# Patient Record
Sex: Female | Born: 1985 | Race: White | Marital: Married | Smoking: Never smoker
Health system: Southern US, Community
[De-identification: ages and names within clinical notes are randomized; demographics above are authoritative.]

## PROBLEM LIST (undated history)

## (undated) DIAGNOSIS — R112 Nausea with vomiting, unspecified: Secondary | ICD-10-CM

## (undated) DIAGNOSIS — T8859XA Other complications of anesthesia, initial encounter: Secondary | ICD-10-CM

## (undated) DIAGNOSIS — N12 Tubulo-interstitial nephritis, not specified as acute or chronic: Secondary | ICD-10-CM

## (undated) DIAGNOSIS — Z1371 Encounter for nonprocreative screening for genetic disease carrier status: Secondary | ICD-10-CM

## (undated) DIAGNOSIS — N390 Urinary tract infection, site not specified: Secondary | ICD-10-CM

## (undated) DIAGNOSIS — Z9889 Other specified postprocedural states: Secondary | ICD-10-CM

## (undated) DIAGNOSIS — U071 COVID-19: Secondary | ICD-10-CM

## (undated) DIAGNOSIS — Z803 Family history of malignant neoplasm of breast: Secondary | ICD-10-CM

## (undated) DIAGNOSIS — B019 Varicella without complication: Secondary | ICD-10-CM

## (undated) DIAGNOSIS — T4145XA Adverse effect of unspecified anesthetic, initial encounter: Secondary | ICD-10-CM

## (undated) DIAGNOSIS — F419 Anxiety disorder, unspecified: Secondary | ICD-10-CM

## (undated) DIAGNOSIS — D169 Benign neoplasm of bone and articular cartilage, unspecified: Secondary | ICD-10-CM

## (undated) DIAGNOSIS — T7840XA Allergy, unspecified, initial encounter: Secondary | ICD-10-CM

## (undated) HISTORY — DX: Allergy, unspecified, initial encounter: T78.40XA

## (undated) HISTORY — DX: Anxiety disorder, unspecified: F41.9

## (undated) HISTORY — DX: Benign neoplasm of bone and articular cartilage, unspecified: D16.9

## (undated) HISTORY — DX: Other specified postprocedural states: Z98.890

## (undated) HISTORY — DX: Family history of malignant neoplasm of breast: Z80.3

## (undated) HISTORY — DX: Tubulo-interstitial nephritis, not specified as acute or chronic: N12

## (undated) HISTORY — DX: Urinary tract infection, site not specified: N39.0

## (undated) HISTORY — DX: Encounter for nonprocreative screening for genetic disease carrier status: Z13.71

## (undated) HISTORY — DX: Varicella without complication: B01.9

## (undated) HISTORY — DX: COVID-19: U07.1

## (undated) HISTORY — PX: BONE CYST EXCISION: SHX376

---
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
## 2012-06-22 ENCOUNTER — Encounter: Payer: Self-pay | Admitting: Obstetrics and Gynecology

## 2012-06-22 ENCOUNTER — Ambulatory Visit (INDEPENDENT_AMBULATORY_CARE_PROVIDER_SITE_OTHER): Payer: BC Managed Care – PPO | Admitting: Obstetrics and Gynecology

## 2012-06-22 VITALS — BP 117/69 | HR 63 | Ht 63.0 in | Wt 109.0 lb

## 2012-06-22 DIAGNOSIS — Z124 Encounter for screening for malignant neoplasm of cervix: Secondary | ICD-10-CM

## 2012-06-22 DIAGNOSIS — Z01419 Encounter for gynecological examination (general) (routine) without abnormal findings: Secondary | ICD-10-CM

## 2012-06-22 DIAGNOSIS — Z113 Encounter for screening for infections with a predominantly sexual mode of transmission: Secondary | ICD-10-CM

## 2012-06-22 MED ORDER — NORGESTIMATE-ETH ESTRADIOL 0.25-35 MG-MCG PO TABS: 1.0000 | ORAL_TABLET | Freq: Every day | ORAL | Status: DC

## 2012-06-22 NOTE — Patient Instructions (Signed)
Preventive Care for Adults, Female A healthy lifestyle and preventive care can promote health and wellness. Preventive health guidelines for women include the following key practices.  A routine yearly physical is a good way to check with your caregiver about your health and preventive screening. It is a chance to share any concerns and updates on your health, and to receive a thorough exam.   Visit your dentist for a routine exam and preventive care every 6 months. Brush your teeth twice a day and floss once a day. Good oral hygiene prevents tooth decay and gum disease.   The frequency of eye exams is based on your age, health, family medical history, use of contact lenses, and other factors. Follow your caregiver's recommendations for frequency of eye exams.   Eat a healthy diet. Foods like vegetables, fruits, whole grains, low-fat dairy products, and lean protein foods contain the nutrients you need without too many calories. Decrease your intake of foods high in solid fats, added sugars, and salt. Eat the right amount of calories for you.Get information about a proper diet from your caregiver, if necessary.   Regular physical exercise is one of the most important things you can do for your health. Most adults should get at least 150 minutes of moderate-intensity exercise (any activity that increases your heart rate and causes you to sweat) each week. In addition, most adults need muscle-strengthening exercises on 2 or more days a week.   Maintain a healthy weight. The body mass index (BMI) is a screening tool to identify possible weight problems. It provides an estimate of body fat based on height and weight. Your caregiver can help determine your BMI, and can help you achieve or maintain a healthy weight.For adults 20 years and older:   A BMI below 18.5 is considered underweight.   A BMI of 18.5 to 24.9 is normal.   A BMI of 25 to 29.9 is considered overweight.   A BMI of 30 and above is  considered obese.   Maintain normal blood lipids and cholesterol levels by exercising and minimizing your intake of saturated fat. Eat a balanced diet with plenty of fruit and vegetables. Blood tests for lipids and cholesterol should begin at age 20 and be repeated every 5 years. If your lipid or cholesterol levels are high, you are over 50, or you are at high risk for heart disease, you may need your cholesterol levels checked more frequently.Ongoing high lipid and cholesterol levels should be treated with medicines if diet and exercise are not effective.   If you smoke, find out from your caregiver how to quit. If you do not use tobacco, do not start.   If you are pregnant, do not drink alcohol. If you are breastfeeding, be very cautious about drinking alcohol. If you are not pregnant and choose to drink alcohol, do not exceed 1 drink per day. One drink is considered to be 12 ounces (355 mL) of beer, 5 ounces (148 mL) of wine, or 1.5 ounces (44 mL) of liquor.   Avoid use of street drugs. Do not share needles with anyone. Ask for help if you need support or instructions about stopping the use of drugs.   High blood pressure causes heart disease and increases the risk of stroke. Your blood pressure should be checked at least every 1 to 2 years. Ongoing high blood pressure should be treated with medicines if weight loss and exercise are not effective.   If you are 55 to 24   years old, ask your caregiver if you should take aspirin to prevent strokes.   Diabetes screening involves taking a blood sample to check your fasting blood sugar level. This should be done once every 3 years, after age 45, if you are within normal weight and without risk factors for diabetes. Testing should be considered at a younger age or be carried out more frequently if you are overweight and have at least 1 risk factor for diabetes.   Breast cancer screening is essential preventive care for women. You should practice "breast  self-awareness." This means understanding the normal appearance and feel of your breasts and may include breast self-examination. Any changes detected, no matter how small, should be reported to a caregiver. Women in their 20s and 30s should have a clinical breast exam (CBE) by a caregiver as part of a regular health exam every 1 to 3 years. After age 40, women should have a CBE every year. Starting at age 40, women should consider having a mammography (breast X-ray test) every year. Women who have a family history of breast cancer should talk to their caregiver about genetic screening. Women at a high risk of breast cancer should talk to their caregivers about having magnetic resonance imaging (MRI) and a mammography every year.   The Pap test is a screening test for cervical cancer. A Pap test can show cell changes on the cervix that might become cervical cancer if left untreated. A Pap test is a procedure in which cells are obtained and examined from the lower end of the uterus (cervix).   Women should have a Pap test starting at age 21.   Between ages 21 and 29, Pap tests should be repeated every 2 years.   Beginning at age 30, you should have a Pap test every 3 years as long as the past 3 Pap tests have been normal.   Some women have medical problems that increase the chance of getting cervical cancer. Talk to your caregiver about these problems. It is especially important to talk to your caregiver if a new problem develops soon after your last Pap test. In these cases, your caregiver may recommend more frequent screening and Pap tests.   The above recommendations are the same for women who have or have not gotten the vaccine for human papillomavirus (HPV).   If you had a hysterectomy for a problem that was not cancer or a condition that could lead to cancer, then you no longer need Pap tests. Even if you no longer need a Pap test, a regular exam is a good idea to make sure no other problems are  starting.   If you are between ages 65 and 70, and you have had normal Pap tests going back 10 years, you no longer need Pap tests. Even if you no longer need a Pap test, a regular exam is a good idea to make sure no other problems are starting.   If you have had past treatment for cervical cancer or a condition that could lead to cancer, you need Pap tests and screening for cancer for at least 20 years after your treatment.   If Pap tests have been discontinued, risk factors (such as a new sexual partner) need to be reassessed to determine if screening should be resumed.   The HPV test is an additional test that may be used for cervical cancer screening. The HPV test looks for the virus that can cause the cell changes on the cervix.   The cells collected during the Pap test can be tested for HPV. The HPV test could be used to screen women aged 30 years and older, and should be used in women of any age who have unclear Pap test results. After the age of 30, women should have HPV testing at the same frequency as a Pap test.   Colorectal cancer can be detected and often prevented. Most routine colorectal cancer screening begins at the age of 50 and continues through age 75. However, your caregiver may recommend screening at an earlier age if you have risk factors for colon cancer. On a yearly basis, your caregiver may provide home test kits to check for hidden blood in the stool. Use of a small camera at the end of a tube, to directly examine the colon (sigmoidoscopy or colonoscopy), can detect the earliest forms of colorectal cancer. Talk to your caregiver about this at age 50, when routine screening begins. Direct examination of the colon should be repeated every 5 to 10 years through age 75, unless early forms of pre-cancerous polyps or small growths are found.   Hepatitis C blood testing is recommended for all people born from 1945 through 1965 and any individual with known risks for hepatitis C.    Practice safe sex. Use condoms and avoid high-risk sexual practices to reduce the spread of sexually transmitted infections (STIs). STIs include gonorrhea, chlamydia, syphilis, trichomonas, herpes, HPV, and human immunodeficiency virus (HIV). Herpes, HIV, and HPV are viral illnesses that have no cure. They can result in disability, cancer, and death. Sexually active women aged 25 and younger should be checked for chlamydia. Older women with new or multiple partners should also be tested for chlamydia. Testing for other STIs is recommended if you are sexually active and at increased risk.   Osteoporosis is a disease in which the bones lose minerals and strength with aging. This can result in serious bone fractures. The risk of osteoporosis can be identified using a bone density scan. Women ages 65 and over and women at risk for fractures or osteoporosis should discuss screening with their caregivers. Ask your caregiver whether you should take a calcium supplement or vitamin D to reduce the rate of osteoporosis.   Menopause can be associated with physical symptoms and risks. Hormone replacement therapy is available to decrease symptoms and risks. You should talk to your caregiver about whether hormone replacement therapy is right for you.   Use sunscreen with sun protection factor (SPF) of 30 or more. Apply sunscreen liberally and repeatedly throughout the day. You should seek shade when your shadow is shorter than you. Protect yourself by wearing long sleeves, pants, a wide-brimmed hat, and sunglasses year round, whenever you are outdoors.   Once a month, do a whole body skin exam, using a mirror to look at the skin on your back. Notify your caregiver of new moles, moles that have irregular borders, moles that are larger than a pencil eraser, or moles that have changed in shape or color.   Stay current with required immunizations.   Influenza. You need a dose every fall (or winter). The composition of  the flu vaccine changes each year, so being vaccinated once is not enough.   Pneumococcal polysaccharide. You need 1 to 2 doses if you smoke cigarettes or if you have certain chronic medical conditions. You need 1 dose at age 65 (or older) if you have never been vaccinated.   Tetanus, diphtheria, pertussis (Tdap, Td). Get 1 dose of   Tdap vaccine if you are younger than age 65, are over 65 and have contact with an infant, are a healthcare worker, are pregnant, or simply want to be protected from whooping cough. After that, you need a Td booster dose every 10 years. Consult your caregiver if you have not had at least 3 tetanus and diphtheria-containing shots sometime in your life or have a deep or dirty wound.   HPV. You need this vaccine if you are a woman age 26 or younger. The vaccine is given in 3 doses over 6 months.   Measles, mumps, rubella (MMR). You need at least 1 dose of MMR if you were born in 1957 or later. You may also need a second dose.   Meningococcal. If you are age 19 to 21 and a first-year college student living in a residence hall, or have one of several medical conditions, you need to get vaccinated against meningococcal disease. You may also need additional booster doses.   Zoster (shingles). If you are age 60 or older, you should get this vaccine.   Varicella (chickenpox). If you have never had chickenpox or you were vaccinated but received only 1 dose, talk to your caregiver to find out if you need this vaccine.   Hepatitis A. You need this vaccine if you have a specific risk factor for hepatitis A virus infection or you simply wish to be protected from this disease. The vaccine is usually given as 2 doses, 6 to 18 months apart.   Hepatitis B. You need this vaccine if you have a specific risk factor for hepatitis B virus infection or you simply wish to be protected from this disease. The vaccine is given in 3 doses, usually over 6 months.  Preventive Services /  Frequency Ages 19 to 39  Blood pressure check.** / Every 1 to 2 years.   Lipid and cholesterol check.** / Every 5 years beginning at age 20.   Clinical breast exam.** / Every 3 years for women in their 20s and 30s.   Pap test.** / Every 2 years from ages 21 through 29. Every 3 years starting at age 30 through age 65 or 70 with a history of 3 consecutive normal Pap tests.   HPV screening.** / Every 3 years from ages 30 through ages 65 to 70 with a history of 3 consecutive normal Pap tests.   Hepatitis C blood test.** / For any individual with known risks for hepatitis C.   Skin self-exam. / Monthly.   Influenza immunization.** / Every year.   Pneumococcal polysaccharide immunization.** / 1 to 2 doses if you smoke cigarettes or if you have certain chronic medical conditions.   Tetanus, diphtheria, pertussis (Tdap, Td) immunization. / A one-time dose of Tdap vaccine. After that, you need a Td booster dose every 10 years.   HPV immunization. / 3 doses over 6 months, if you are 26 and younger.   Measles, mumps, rubella (MMR) immunization. / You need at least 1 dose of MMR if you were born in 1957 or later. You may also need a second dose.   Meningococcal immunization. / 1 dose if you are age 19 to 21 and a first-year college student living in a residence hall, or have one of several medical conditions, you need to get vaccinated against meningococcal disease. You may also need additional booster doses.   Varicella immunization.** / Consult your caregiver.   Hepatitis A immunization.** / Consult your caregiver. 2 doses, 6 to 18 months   apart.   Hepatitis B immunization.** / Consult your caregiver. 3 doses usually over 6 months.    ** Family history and personal history of risk and conditions may change your caregiver's recommendations. Document Released: 11/19/2001 Document Revised: 09/12/2011 Document Reviewed: 02/18/2011 [employer] Patient Information 2012 [employer]. 

## 2012-06-22 NOTE — Progress Notes (Signed)
  Subjective:     Chelsea Duncan is a 26 y.o. female G) with LMP 06/08/2012 and BMI 19 who is here for a comprehensive physical exam. The patient reports no problems. Patient desires to change current OCP as she does not like the way it makes her feel. Patient reports being on different birth control pills in the past and neither have made for feel so moody. Patient is currently using condoms.  History   Social History  . Marital Status: Single    Spouse Name: N/A    Number of Children: N/A  . Years of Education: N/A   Occupational History  . Not on file.   Social History Main Topics  . Smoking status: Never Smoker   . Smokeless tobacco: Not on file  . Alcohol Use: Yes     social  . Drug Use: No  . Sexually Active: Yes -- Female partner(s)    Birth Control/ Protection: Condom   Other Topics Concern  . Not on file   Social History Narrative  . No narrative on file   No health maintenance topics applied.     Review of Systems A comprehensive review of systems was negative.   Objective:     GENERAL: Well-developed, well-nourished female in no acute distress.  HEENT: Normocephalic, atraumatic. Sclerae anicteric.  NECK: Supple. Normal thyroid.  LUNGS: Clear to auscultation bilaterally.  HEART: Regular rate and rhythm. BREASTS: Symmetric in size. No palpable masses or lymphadenopathy, skin changes, or nipple drainage. ABDOMEN: Soft, nontender, nondistended. No organomegaly. PELVIC: Normal external female genitalia. Vagina is pink and rugated.  Normal discharge. Normal appearing cervix. Uterus is normal in size. No adnexal mass or tenderness. EXTREMITIES: No cyanosis, clubbing, or edema, 2+ distal pulses.    Assessment:    Healthy female exam.      Plan:    pap smear collected Patient will be contacted with any abnormal results Rx Sprintec provided See After Visit Summary for Counseling Recommendations

## 2012-11-03 ENCOUNTER — Ambulatory Visit: Payer: BC Managed Care – PPO | Admitting: Obstetrics & Gynecology

## 2012-11-16 ENCOUNTER — Ambulatory Visit: Payer: BC Managed Care – PPO | Admitting: Obstetrics & Gynecology

## 2013-06-01 ENCOUNTER — Other Ambulatory Visit: Payer: Self-pay | Admitting: *Deleted

## 2013-06-01 MED ORDER — NORGESTIMATE-ETH ESTRADIOL 0.25-35 MG-MCG PO TABS: 1.0000 | ORAL_TABLET | Freq: Every day | ORAL | Status: DC

## 2013-06-01 NOTE — Telephone Encounter (Signed)
Patient will need refills of ocp before her appointment is due in September.  Rx called in with 2 refills.

## 2013-07-01 ENCOUNTER — Encounter: Payer: Self-pay | Admitting: Family Medicine

## 2013-07-01 ENCOUNTER — Ambulatory Visit (INDEPENDENT_AMBULATORY_CARE_PROVIDER_SITE_OTHER): Payer: BC Managed Care – PPO | Admitting: Family Medicine

## 2013-07-01 VITALS — BP 112/70 | HR 60 | Ht 64.0 in | Wt 109.0 lb

## 2013-07-01 DIAGNOSIS — Z3041 Encounter for surveillance of contraceptive pills: Secondary | ICD-10-CM

## 2013-07-01 DIAGNOSIS — Z23 Encounter for immunization: Secondary | ICD-10-CM

## 2013-07-01 DIAGNOSIS — Z01419 Encounter for gynecological examination (general) (routine) without abnormal findings: Secondary | ICD-10-CM

## 2013-07-01 DIAGNOSIS — Z803 Family history of malignant neoplasm of breast: Secondary | ICD-10-CM

## 2013-07-01 MED ORDER — NORGESTIMATE-ETH ESTRADIOL 0.25-35 MG-MCG PO TABS: 1.0000 | ORAL_TABLET | Freq: Every day | ORAL | Status: DC

## 2013-07-01 NOTE — Patient Instructions (Addendum)
Preparing for Pregnancy Preparing for pregnancy (preconceptual care) by getting counseling and information from your caregiver before getting pregnant is a good idea. It will help you and your baby have a better chance to have a healthy, safe pregnancy and delivery of your baby. Make an appointment with your caregiver to talk about your health, medical, and family history and how to prepare yourself before getting pregnant. Your caregiver will do a complete physical exam and a Pap test. They will want to know:  About you, your spouse or partner, and your family's medical and genetic history.  If you are eating a balanced diet and drinking enough fluids.  What vitamins and mineral supplements you are taking. This includes taking folic acid before getting pregnant to help prevent birth defects.  What medications you are taking including prescription, over-the-counter and herbal medications.  If there is any substance abuse like alcohol, smoking, and illegal drugs.  If there is any mental or physical domestic violence.  If there is any risk of sexually transmitted disease between you and your partner.  What immunizations and vaccinations you have had and what you may need before getting pregnant.  If you should get tested for HIV infection.  If there is any exposure to chemical or toxic substances at home or work.  If there are medical problems you have that need to be treated and kept under control before getting pregnant such as diabetes, high blood pressure or others.  If there were any past surgeries, pregnancies and problems with them.  What your current weight is and to set a goal as to how much weight you should gain while pregnant. Also, they will check if you should lose or gain weight before getting pregnant.  What is your exercise routine and what it is safe when you are pregnant.  If there are any physical disabilities that need to be addressed.  About spacing your  pregnancies when there are other children.  If there is a financial problem that may affect you having a child. After talking about the above points with your caregiver, your caregiver will give you advice on how to help treat and work with you on solving any issues, if necessary, before getting pregnant. The goal is to have a healthy and safe pregnancy for you and your baby. You should keep an accurate record of your menstrual periods because it will help in determining your due date. Immunizations that you should have before getting pregnant:   Regular measles, [place] measles (rubella) and mumps.  Tetanus and diphtheria.  Chickenpox, if not immune.  Herpes zoster (Varicella) if not immune.  Human papilloma virus vaccine (HPV) between the age of 63 and 78 years old.  Hepatitis A vaccine.  Hepatitis B vaccine.  Influenza vaccine.  Pneumococcal vaccine (pneumonia). You should avoid getting pregnant for one month after getting vaccinated with a live virus vaccine such as [place] measles (rubella) vaccine. Other immunizations may be necessary depending on where you live, such as malaria. Ask your caregiver if any other immunizations are needed for you. HOME CARE INSTRUCTIONS   Follow the advice of your caregiver.  Before getting pregnant:  Begin taking vitamins, supplements, and 0.4 milligrams folic acid daily.  Get your immunizations up-to-date.  Get help from a nutrition counselor if you do not understand what a balanced diet is, need help with a special medical diet or if you need help to lose or gain weight.  Begin exercising.  Stop smoking, taking illegal drugs,  and drinking alcoholic beverages.  Get counseling if there is and type of domestic violence.  Get checked for sexually transmitted diseases including HIV.  Get any medical problems under control (diabetes, high blood pressure, convulsions, asthma or others).  Resolve any financial concerns.  Be sure you and  your spouse or partner are ready to have a baby.  Keep an accurate record of your menstrual periods. Document Released: 09/05/2008 Document Revised: 12/16/2011 Document Reviewed: 09/05/2008 [facility] Patient Information 2014 [place], [place]. Preventive Care for Adults, Female A healthy lifestyle and preventive care can promote health and wellness. Preventive health guidelines for women include the following key practices.  A routine yearly physical is a good way to check with your caregiver about your health and preventive screening. It is a chance to share any concerns and updates on your health, and to receive a thorough exam.  Visit your dentist for a routine exam and preventive care every 6 months. Brush your teeth twice a day and floss once a day. Good oral hygiene prevents tooth decay and gum disease.  The frequency of eye exams is based on your age, health, family medical history, use of contact lenses, and other factors. Follow your caregiver's recommendations for frequency of eye exams.  Eat a healthy diet. Foods like vegetables, fruits, whole grains, low-fat dairy products, and lean protein foods contain the nutrients you need without too many calories. Decrease your intake of foods high in solid fats, added sugars, and salt. Eat the right amount of calories for you.Get information about a proper diet from your caregiver, if necessary.  Regular physical exercise is one of the most important things you can do for your health. Most adults should get at least 150 minutes of moderate-intensity exercise (any activity that increases your heart rate and causes you to sweat) each week. In addition, most adults need muscle-strengthening exercises on 2 or more days a week.  Maintain a healthy weight. The body mass index (BMI) is a screening tool to identify possible weight problems. It provides an estimate of body fat based on height and weight. Your caregiver can help determine your BMI, and can  help you achieve or maintain a healthy weight.For adults 20 years and older:  A BMI below 18.5 is considered underweight.  A BMI of 18.5 to 24.9 is normal.  A BMI of 25 to 29.9 is considered overweight.  A BMI of 30 and above is considered obese.  Maintain normal blood lipids and cholesterol levels by exercising and minimizing your intake of saturated fat. Eat a balanced diet with plenty of fruit and vegetables. Blood tests for lipids and cholesterol should begin at age 39 and be repeated every 5 years. If your lipid or cholesterol levels are high, you are over 50, or you are at high risk for heart disease, you may need your cholesterol levels checked more frequently.Ongoing high lipid and cholesterol levels should be treated with medicines if diet and exercise are not effective.  If you smoke, find out from your caregiver how to quit. If you do not use tobacco, do not start.  If you are pregnant, do not drink alcohol. If you are breastfeeding, be very cautious about drinking alcohol. If you are not pregnant and choose to drink alcohol, do not exceed 1 drink per day. One drink is considered to be 12 ounces (355 mL) of beer, 5 ounces (148 mL) of wine, or 1.5 ounces (44 mL) of liquor.  Avoid use of street drugs. Do not  share needles with anyone. Ask for help if you need support or instructions about stopping the use of drugs.  High blood pressure causes heart disease and increases the risk of stroke. Your blood pressure should be checked at least every 1 to 2 years. Ongoing high blood pressure should be treated with medicines if weight loss and exercise are not effective.  If you are 70 to 25 years old, ask your caregiver if you should take aspirin to prevent strokes.  Diabetes screening involves taking a blood sample to check your fasting blood sugar level. This should be done once every 3 years, after age 73, if you are within normal weight and without risk factors for diabetes. Testing  should be considered at a younger age or be carried out more frequently if you are overweight and have at least 1 risk factor for diabetes.  Breast cancer screening is essential preventive care for women. You should practice "breast self-awareness." This means understanding the normal appearance and feel of your breasts and may include breast self-examination. Any changes detected, no matter how small, should be reported to a caregiver. Women in their 2s and 30s should have a clinical breast exam (CBE) by a caregiver as part of a regular health exam every 1 to 3 years. After age 37, women should have a CBE every year. Starting at age 28, women should consider having a mammography (breast X-ray test) every year. Women who have a family history of breast cancer should talk to their caregiver about genetic screening. Women at a high risk of breast cancer should talk to their caregivers about having magnetic resonance imaging (MRI) and a mammography every year.  The Pap test is a screening test for cervical cancer. A Pap test can show cell changes on the cervix that might become cervical cancer if left untreated. A Pap test is a procedure in which cells are obtained and examined from the lower end of the uterus (cervix).  Women should have a Pap test starting at age 27.  Between ages 42 and 65, Pap tests should be repeated every 2 years.  Beginning at age 45, you should have a Pap test every 3 years as long as the past 3 Pap tests have been normal.  Some women have medical problems that increase the chance of getting cervical cancer. Talk to your caregiver about these problems. It is especially important to talk to your caregiver if a new problem develops soon after your last Pap test. In these cases, your caregiver may recommend more frequent screening and Pap tests.  The above recommendations are the same for women who have or have not gotten the vaccine for human papillomavirus (HPV).  If you had a  hysterectomy for a problem that was not cancer or a condition that could lead to cancer, then you no longer need Pap tests. Even if you no longer need a Pap test, a regular exam is a good idea to make sure no other problems are starting.  If you are between ages 69 and 22, and you have had normal Pap tests going back 10 years, you no longer need Pap tests. Even if you no longer need a Pap test, a regular exam is a good idea to make sure no other problems are starting.  If you have had past treatment for cervical cancer or a condition that could lead to cancer, you need Pap tests and screening for cancer for at least 20 years after your treatment.  If  Pap tests have been discontinued, risk factors (such as a new sexual partner) need to be reassessed to determine if screening should be resumed.  The HPV test is an additional test that may be used for cervical cancer screening. The HPV test looks for the virus that can cause the cell changes on the cervix. The cells collected during the Pap test can be tested for HPV. The HPV test could be used to screen women aged 72 years and older, and should be used in women of any age who have unclear Pap test results. After the age of 23, women should have HPV testing at the same frequency as a Pap test.  Colorectal cancer can be detected and often prevented. Most routine colorectal cancer screening begins at the age of 57 and continues through age 55. However, your caregiver may recommend screening at an earlier age if you have risk factors for colon cancer. On a yearly basis, your caregiver may provide home test kits to check for hidden blood in the stool. Use of a small camera at the end of a tube, to directly examine the colon (sigmoidoscopy or colonoscopy), can detect the earliest forms of colorectal cancer. Talk to your caregiver about this at age 72, when routine screening begins. Direct examination of the colon should be repeated every 5 to 10 years through age  34, unless early forms of pre-cancerous polyps or small growths are found.  Hepatitis C blood testing is recommended for all people born from [birth year] through 1965 and any individual with known risks for hepatitis C.  Practice safe sex. Use condoms and avoid high-risk sexual practices to reduce the spread of sexually transmitted infections (STIs). STIs include gonorrhea, chlamydia, syphilis, trichomonas, herpes, HPV, and human immunodeficiency virus (HIV). Herpes, HIV, and HPV are viral illnesses that have no cure. They can result in disability, cancer, and death. Sexually active women aged 41 and younger should be checked for chlamydia. Older women with new or multiple partners should also be tested for chlamydia. Testing for other STIs is recommended if you are sexually active and at increased risk.  Osteoporosis is a disease in which the bones lose minerals and strength with aging. This can result in serious bone fractures. The risk of osteoporosis can be identified using a bone density scan. Women ages 110 and over and women at risk for fractures or osteoporosis should discuss screening with their caregivers. Ask your caregiver whether you should take a calcium supplement or vitamin D to reduce the rate of osteoporosis.  Menopause can be associated with physical symptoms and risks. Hormone replacement therapy is available to decrease symptoms and risks. You should talk to your caregiver about whether hormone replacement therapy is right for you.  Use sunscreen with sun protection factor (SPF) of 30 or more. Apply sunscreen liberally and repeatedly throughout the day. You should seek shade when your shadow is shorter than you. Protect yourself by wearing long sleeves, pants, a wide-brimmed hat, and sunglasses year round, whenever you are outdoors.  Once a month, do a whole body skin exam, using a mirror to look at the skin on your back. Notify your caregiver of new moles, moles that have irregular borders,  moles that are larger than a pencil eraser, or moles that have changed in shape or color.  Stay current with required immunizations.  Influenza. You need a dose every fall (or winter). The composition of the flu vaccine changes each year, so being vaccinated once is not enough.  Pneumococcal polysaccharide. You need 1 to 2 doses if you smoke cigarettes or if you have certain chronic medical conditions. You need 1 dose at age 16 (or older) if you have never been vaccinated.  Tetanus, diphtheria, pertussis (Tdap, Td). Get 1 dose of Tdap vaccine if you are younger than age 56, are over 13 and have contact with an infant, are a Research scientist (physical sciences), are pregnant, or simply want to be protected from whooping cough. After that, you need a Td booster dose every 10 years. Consult your caregiver if you have not had at least 3 tetanus and diphtheria-containing shots sometime in your life or have a deep or dirty wound.  HPV. You need this vaccine if you are a woman age 73 or younger. The vaccine is given in 3 doses over 6 months.  Measles, mumps, rubella (MMR). You need at least 1 dose of MMR if you were born in 1957 or later. You may also need a second dose.  Meningococcal. If you are age 68 to 50 and a first-year college student living in a residence hall, or have one of several medical conditions, you need to get vaccinated against meningococcal disease. You may also need additional booster doses.  Zoster (shingles). If you are age 25 or older, you should get this vaccine.  Varicella (chickenpox). If you have never had chickenpox or you were vaccinated but received only 1 dose, talk to your caregiver to find out if you need this vaccine.  Hepatitis A. You need this vaccine if you have a specific risk factor for hepatitis A virus infection or you simply wish to be protected from this disease. The vaccine is usually given as 2 doses, 6 to 18 months apart.  Hepatitis B. You need this vaccine if you have a  specific risk factor for hepatitis B virus infection or you simply wish to be protected from this disease. The vaccine is given in 3 doses, usually over 6 months. Preventive Services / Frequency Ages 61 to 19  Blood pressure check.** / Every 1 to 2 years.  Lipid and cholesterol check.** / Every 5 years beginning at age 60.  Clinical breast exam.** / Every 3 years for women in their 62s and 30s.  Pap test.** / Every 2 years from ages 35 through 89. Every 3 years starting at age 75 through age 40 or 12 with a history of 3 consecutive normal Pap tests.  HPV screening.** / Every 3 years from ages 86 through ages 30 to 70 with a history of 3 consecutive normal Pap tests.  Hepatitis C blood test.** / For any individual with known risks for hepatitis C.  Skin self-exam. / Monthly.  Influenza immunization.** / Every year.  Pneumococcal polysaccharide immunization.** / 1 to 2 doses if you smoke cigarettes or if you have certain chronic medical conditions.  Tetanus, diphtheria, pertussis (Tdap, Td) immunization. / A one-time dose of Tdap vaccine. After that, you need a Td booster dose every 10 years.  HPV immunization. / 3 doses over 6 months, if you are 81 and younger.  Measles, mumps, rubella (MMR) immunization. / You need at least 1 dose of MMR if you were born in 1957 or later. You may also need a second dose.  Meningococcal immunization. / 1 dose if you are age 83 to 6 and a first-year college student living in a residence hall, or have one of several medical conditions, you need to get vaccinated against meningococcal disease. You may also need additional  booster doses.  Varicella immunization.** / Consult your caregiver.  Hepatitis A immunization.** / Consult your caregiver. 2 doses, 6 to 18 months apart.  Hepatitis B immunization.** / Consult your caregiver. 3 doses usually over 6 months. Ages 33 to 59  Blood pressure check.** / Every 1 to 2 years.  Lipid and cholesterol  check.** / Every 5 years beginning at age 40.  Clinical breast exam.** / Every year after age 75.  Mammogram.** / Every year beginning at age 87 and continuing for as long as you are in good health. Consult with your caregiver.  Pap test.** / Every 3 years starting at age 32 through age 53 or 16 with a history of 3 consecutive normal Pap tests.  HPV screening.** / Every 3 years from ages 68 through ages 36 to 42 with a history of 3 consecutive normal Pap tests.  Fecal occult blood test (FOBT) of stool. / Every year beginning at age 66 and continuing until age 36. You may not need to do this test if you get a colonoscopy every 10 years.  Flexible sigmoidoscopy or colonoscopy.** / Every 5 years for a flexible sigmoidoscopy or every 10 years for a colonoscopy beginning at age 63 and continuing until age 82.  Hepatitis C blood test.** / For all people born from [birth year] through 1963 and any individual with known risks for hepatitis C.  Skin self-exam. / Monthly.  Influenza immunization.** / Every year.  Pneumococcal polysaccharide immunization.** / 1 to 2 doses if you smoke cigarettes or if you have certain chronic medical conditions.  Tetanus, diphtheria, pertussis (Tdap, Td) immunization.** / A one-time dose of Tdap vaccine. After that, you need a Td booster dose every 10 years.  Measles, mumps, rubella (MMR) immunization. / You need at least 1 dose of MMR if you were born in 1957 or later. You may also need a second dose.  Varicella immunization.** / Consult your caregiver.  Meningococcal immunization.** / Consult your caregiver.  Hepatitis A immunization.** / Consult your caregiver. 2 doses, 6 to 18 months apart.  Hepatitis B immunization.** / Consult your caregiver. 3 doses, usually over 6 months. Ages 90 and over  Blood pressure check.** / Every 1 to 2 years.  Lipid and cholesterol check.** / Every 5 years beginning at age 78.  Clinical breast exam.** / Every year after age  23.  Mammogram.** / Every year beginning at age 86 and continuing for as long as you are in good health. Consult with your caregiver.  Pap test.** / Every 3 years starting at age 31 through age 36 or 74 with a 3 consecutive normal Pap tests. Testing can be stopped between 65 and 70 with 3 consecutive normal Pap tests and no abnormal Pap or HPV tests in the past 10 years.  HPV screening.** / Every 3 years from ages 44 through ages 50 or 66 with a history of 3 consecutive normal Pap tests. Testing can be stopped between 65 and 70 with 3 consecutive normal Pap tests and no abnormal Pap or HPV tests in the past 10 years.  Fecal occult blood test (FOBT) of stool. / Every year beginning at age 33 and continuing until age 44. You may not need to do this test if you get a colonoscopy every 10 years.  Flexible sigmoidoscopy or colonoscopy.** / Every 5 years for a flexible sigmoidoscopy or every 10 years for a colonoscopy beginning at age 77 and continuing until age 12.  Hepatitis C blood test.** / For  all people born from [birth year] through 1963 and any individual with known risks for hepatitis C.  Osteoporosis screening.** / A one-time screening for women ages 78 and over and women at risk for fractures or osteoporosis.  Skin self-exam. / Monthly.  Influenza immunization.** / Every year.  Pneumococcal polysaccharide immunization.** / 1 dose at age 49 (or older) if you have never been vaccinated.  Tetanus, diphtheria, pertussis (Tdap, Td) immunization. / A one-time dose of Tdap vaccine if you are over 65 and have contact with an infant, are a Research scientist (physical sciences), or simply want to be protected from whooping cough. After that, you need a Td booster dose every 10 years.  Varicella immunization.** / Consult your caregiver.  Meningococcal immunization.** / Consult your caregiver.  Hepatitis A immunization.** / Consult your caregiver. 2 doses, 6 to 18 months apart.  Hepatitis B immunization.** / Check with  your caregiver. 3 doses, usually over 6 months. ** Family history and personal history of risk and conditions may change your caregiver's recommendations. Document Released: 11/19/2001 Document Revised: 12/16/2011 Document Reviewed: 02/18/2011 [facility] Patient Information 2014 [place], [place].

## 2013-07-01 NOTE — Progress Notes (Signed)
  Subjective:     Chelsea Duncan is a 27 y.o. female and is here for a comprehensive physical exam. The patient reports no problems. On OC's, regular cycles.  Getting married next week and unsure of when she would like to achieve pregnancy.    History   Social History  . Marital Status: Single    Spouse Name: N/A    Number of Children: N/A  . Years of Education: N/A   Occupational History  . Not on file.   Social History Main Topics  . Smoking status: Never Smoker   . Smokeless tobacco: Not on file  . Alcohol Use: Yes     Comment: social  . Drug Use: No  . Sexual Activity: Yes    Partners: Male    Birth Control/ Protection: Condom, Pill   Other Topics Concern  . Not on file   Social History Narrative  . No narrative on file   Health Maintenance  Topic Date Due  . Tetanus/tdap  12/15/2006  . Influenza Vaccine  05/07/2013  . Pap Smear  06/23/2015    The following portions of the patient's history were reviewed and updated as appropriate: allergies, current medications, past family history, past medical history, past social history, past surgical history and problem list.  Review of Systems A comprehensive review of systems was negative.   Objective:    BP 112/70  Pulse 60  Ht 5\' 4"  (1.626 m)  Wt 109 lb (49.442 kg)  BMI 18.7 kg/m2  LMP 06/10/2013 General appearance: alert, cooperative and appears stated age Head: Normocephalic, without obvious abnormality, atraumatic Neck: no adenopathy, supple, symmetrical, trachea midline and thyroid not enlarged, symmetric, no tenderness/mass/nodules Lungs: clear to auscultation bilaterally Breasts: normal appearance, no masses or tenderness Heart: regular rate and rhythm, S1, S2 normal, no murmur, click, rub or gallop Abdomen: soft, non-tender; bowel sounds normal; no masses,  no organomegaly Pelvic: cervix normal in appearance, external genitalia normal, no adnexal masses or tenderness, no cervical motion tenderness, uterus  normal size, shape, and consistency and vagina normal without discharge Extremities: extremities normal, atraumatic, no cyanosis or edema Pulses: 2+ and symmetric Skin: Skin color, texture, turgor normal. No rashes or lesions Lymph nodes: Cervical, supraclavicular, and axillary nodes normal. Neurologic: Grossly normal    Assessment:    Healthy female exam.       Plan:  Preconception counseling. Continue OC's for now  Does not need pap--WNL last year. Needs mammograms starting next year.   See After Visit Summary for Counseling Recommendations

## 2013-07-26 ENCOUNTER — Ambulatory Visit: Payer: Self-pay | Admitting: Gastroenterology

## 2013-10-05 ENCOUNTER — Encounter: Payer: Self-pay | Admitting: Family Medicine

## 2013-10-05 ENCOUNTER — Ambulatory Visit (INDEPENDENT_AMBULATORY_CARE_PROVIDER_SITE_OTHER): Payer: BC Managed Care – PPO | Admitting: Family Medicine

## 2013-10-05 VITALS — BP 129/81 | HR 79 | Ht 64.0 in | Wt 110.0 lb

## 2013-10-05 DIAGNOSIS — Z331 Pregnant state, incidental: Secondary | ICD-10-CM | POA: Insufficient documentation

## 2013-10-05 DIAGNOSIS — N912 Amenorrhea, unspecified: Secondary | ICD-10-CM

## 2013-10-05 DIAGNOSIS — N6310 Unspecified lump in the right breast, unspecified quadrant: Secondary | ICD-10-CM

## 2013-10-05 DIAGNOSIS — N63 Unspecified lump in unspecified breast: Secondary | ICD-10-CM

## 2013-10-05 LAB — POCT URINE PREGNANCY: Preg Test, Ur: POSITIVE

## 2013-10-05 NOTE — Assessment & Plan Note (Signed)
Likely growing breast tissue, related to pregnancy and underlying fibrocystic change.  However, given strong family history, will check breast sono.

## 2013-10-05 NOTE — Progress Notes (Signed)
   Subjective:    Patient ID: Chelsea Duncan, female    DOB: 07-29-1986, 27 y.o.   MRN: [id number]  HPI  Has h/o fibrocystic change.  Has noted increasing size in findings along right breast.  She is recently married and is pregnant right now.  Has strong fh of breast CA and is wanting an evaluation.  Review of Systems  Constitutional: Negative for fever.  Respiratory: Negative for shortness of breath.   Gastrointestinal: Negative for nausea and abdominal pain.       Objective:   Physical Exam  Vitals reviewed. Constitutional: She appears well-developed and well-nourished.  Cardiovascular: Normal rate.   Pulmonary/Chest: Effort normal.    Abdominal: Soft.          Assessment & Plan:

## 2013-10-05 NOTE — Patient Instructions (Signed)
Fibrocystic Breast Changes Fibrocystic breast changes occur when breast ducts become blocked, causing painful, fluid-filled lumps (cysts) to form in the breast. This is a common condition that is noncancerous (benign). It occurs when women go through hormonal changes during their menstrual cycle. Fibrocystic breast changes can affect one or both breasts. CAUSES  The exact cause of fibrocystic breast changes is not known, but it may be related to the female hormones, estrogen and progesterone. Family traits that get passed from parent to child (genetics) may also be a factor in some cases. SIGNS AND SYMPTOMS   Tenderness, mild discomfort, or pain.   Swelling.   Ropelike feeling when touching the breast.   Lumpy breast, one or both sides.   Changes in breast size, especially before (larger) and after (smaller) the menstrual period.   Green or dark brown nipple discharge (not blood).  Symptoms are usually worse before menstrual periods start and get better toward the end of the menstrual period.  DIAGNOSIS  To make a diagnosis, your health care provider will ask you questions and perform a physical exam of your breasts. The health care provider may recommend other tests that can examine inside your breasts, such as:  A breast X-ray (mammogram).   Ultrasonography.  An MRI.  If something more than fibrocystic breast changes is suspected, your health care provider may take a breast tissue sample (breast biopsy) to examine. TREATMENT  Often, treatment is not needed. Your health care provider may recommend over-the-counter pain relievers to help lessen pain or discomfort caused by the fibrocystic breast changes. You may also be asked to change your diet to limit or stop eating foods or drinking beverages that contain caffeine. Foods and beverages that contain caffeine include chocolate, soda, coffee, and tea. Reducing sugar and fat in your diet may also help. Your health care provider  may also recommend:  Fine needle aspiration to remove fluid from a cyst that is causing pain.   Surgery to remove a large, persistent, and tender cyst. HOME CARE INSTRUCTIONS   Examine your breasts after every menstrual period. If you do not have menstrual periods, check your breasts the first day of every month. Feel for changes, such as more tenderness, a new growth, a change in breast size, or a change in a lump that has always been there.   Only take over-the-counter or prescription medicine as directed by your health care provider.   Wear a well-fitted support or sports bra, especially when exercising.   Decrease or avoid caffeine, fat, and sugar in your diet as directed by your health care provider.  SEEK MEDICAL CARE IF:   You have fluid leaking (discharge) from your nipples, especially bloody discharge.   You have new lumps or bumps in the breast.   Your breast or breasts become enlarged, red, and painful.   You have areas of your breast that pucker in.   Your nipples appear flat or indented.  Document Released: 07/10/2006 Document Revised: 05/26/2013 Document Reviewed: 03/14/2013 [employer] Patient Information 2014 [employer].  

## 2014-03-07 ENCOUNTER — Observation Stay: Payer: Self-pay

## 2014-06-01 ENCOUNTER — Observation Stay: Payer: Self-pay

## 2014-06-02 ENCOUNTER — Inpatient Hospital Stay: Payer: Self-pay

## 2014-06-02 LAB — CBC WITH DIFFERENTIAL/PLATELET
Basophil #: 0 10*3/uL (ref 0.0–0.1)
Basophil %: 0.4 %
EOS ABS: 0.1 10*3/uL (ref 0.0–0.7)
EOS PCT: 1.2 %
HCT: 37.9 % (ref 35.0–47.0)
HGB: 13.4 g/dL (ref 12.0–16.0)
LYMPHS PCT: 19.1 %
Lymphocyte #: 1.7 10*3/uL (ref 1.0–3.6)
MCH: 34.5 pg — AB (ref 26.0–34.0)
MCHC: 35.4 g/dL (ref 32.0–36.0)
MCV: 98 fL (ref 80–100)
Monocyte #: 0.7 x10 3/mm (ref 0.2–0.9)
Monocyte %: 7.5 %
NEUTROS PCT: 71.8 %
Neutrophil #: 6.6 10*3/uL — ABNORMAL HIGH (ref 1.4–6.5)
Platelet: 177 10*3/uL (ref 150–440)
RBC: 3.89 10*6/uL (ref 3.80–5.20)
RDW: 13.6 % (ref 11.5–14.5)
WBC: 9.1 10*3/uL (ref 3.6–11.0)

## 2014-06-04 LAB — HEMATOCRIT: HCT: 35.7 % (ref 35.0–47.0)

## 2014-06-21 IMAGING — US ABDOMEN ULTRASOUND
1 series · 14 of 25 positions shown · non-contrast
Comparison: none

REASON FOR EXAM: complete abd pain generalized  diarrhea  change in bowel
habits
COMMENTS:

[Series 1: abdomen ultrasound · 0.24mm/px · 14 of 76 slices shown]
[im 1/76]
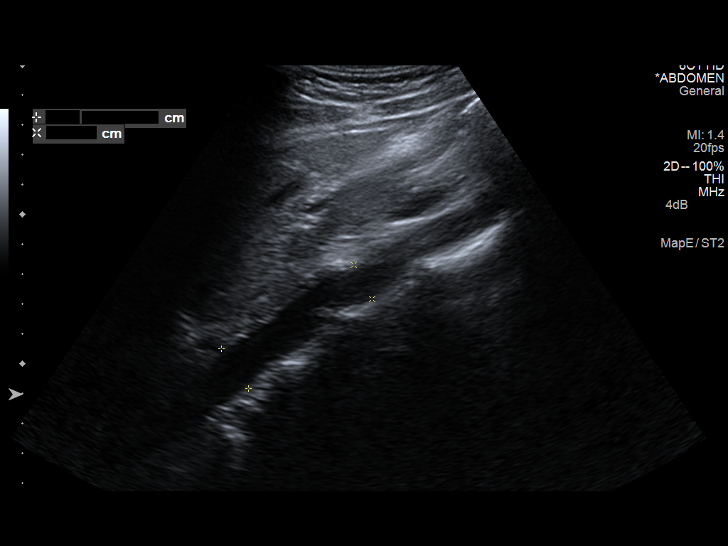
[im 7/76]
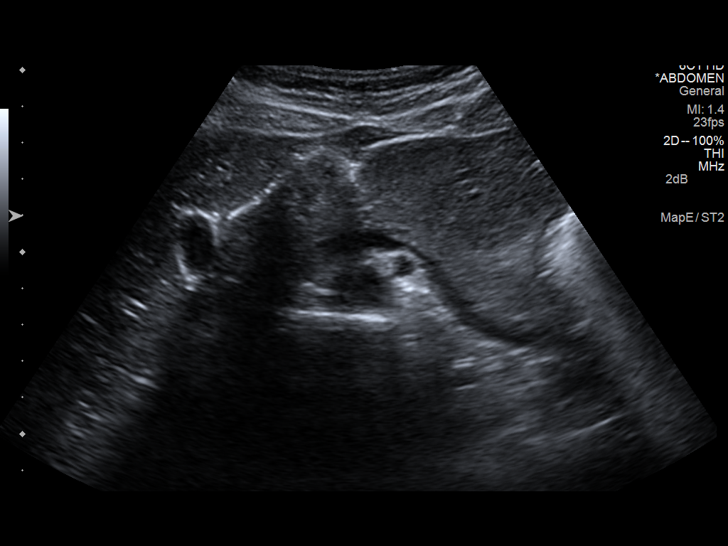
[im 13/76]
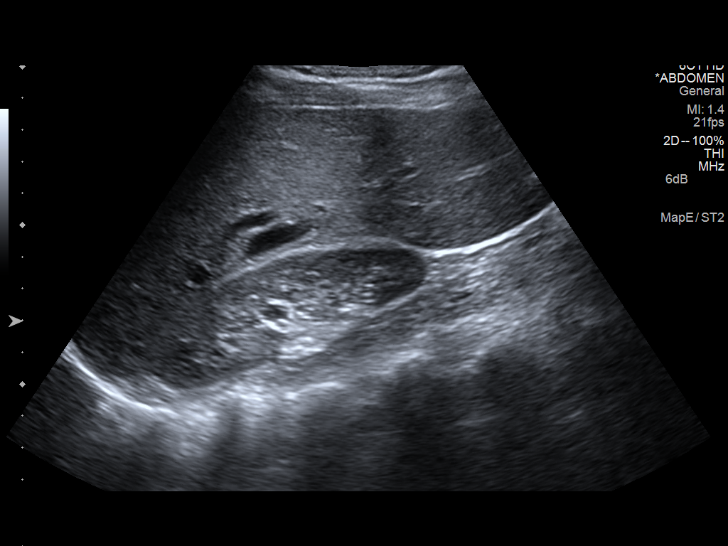
[im 19/76]
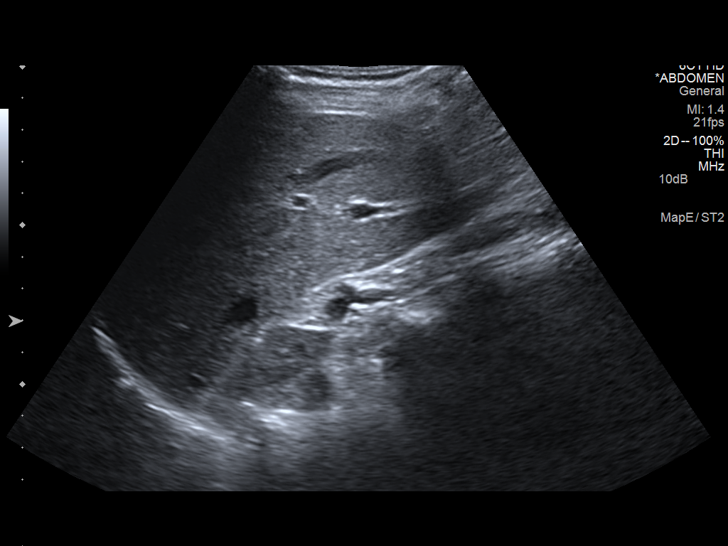
[im 26/76]
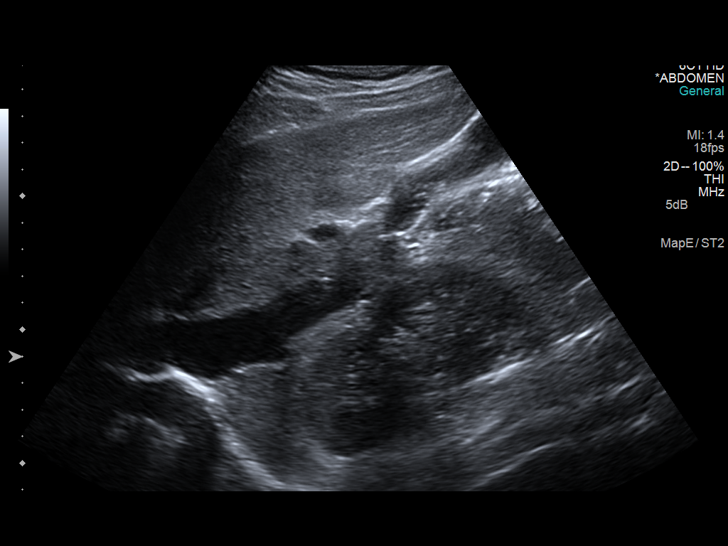
[im 29/76]
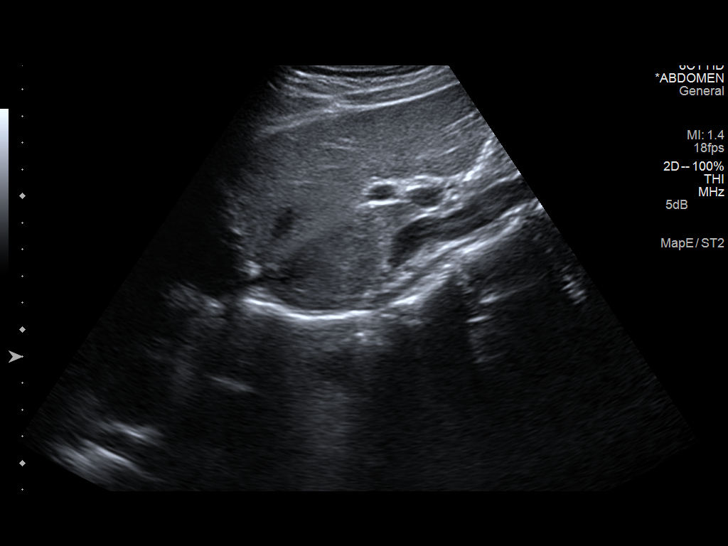
[im 35/76]
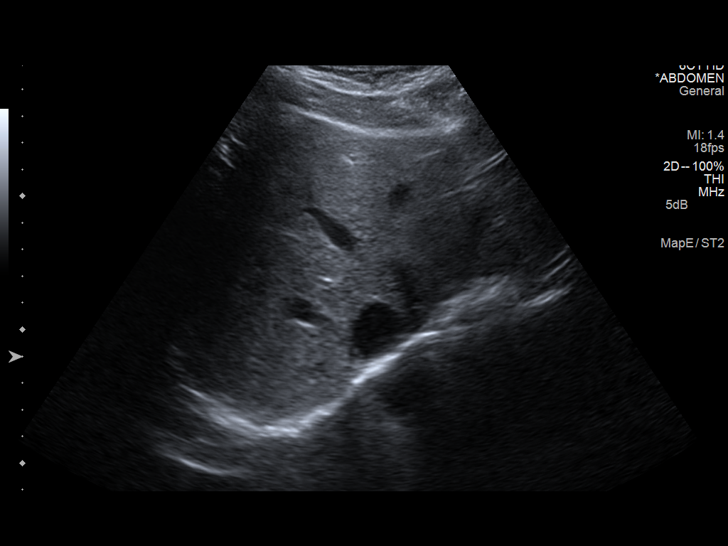
[im 41/76]
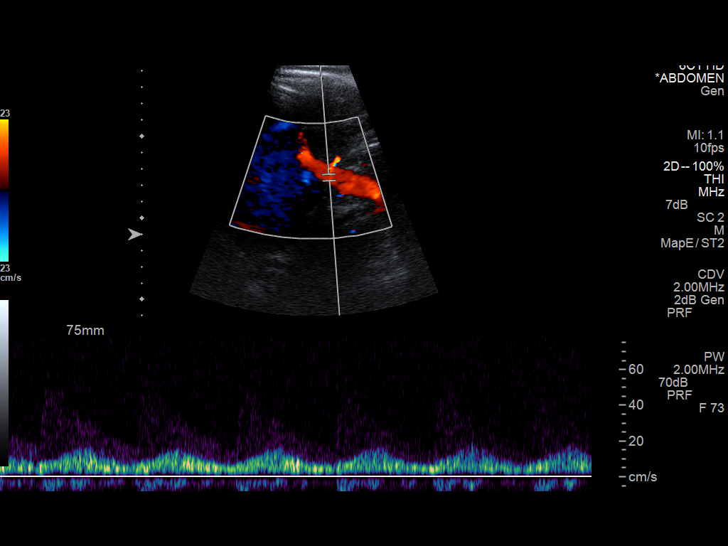
[im 47/76]
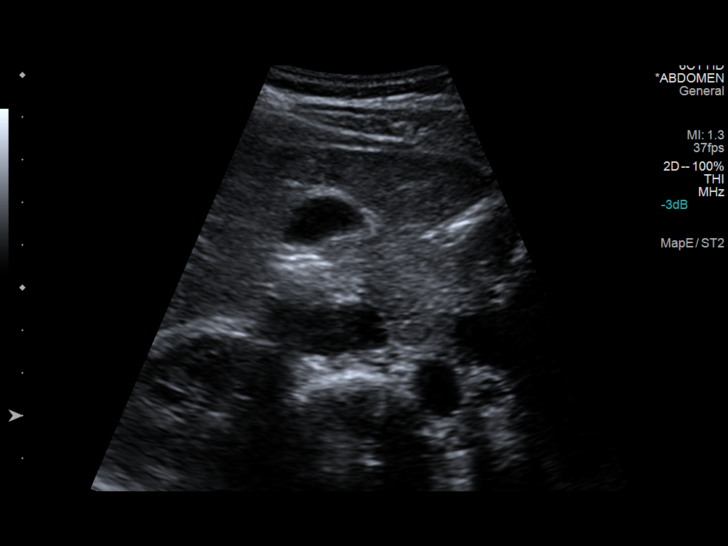
[im 51/76]
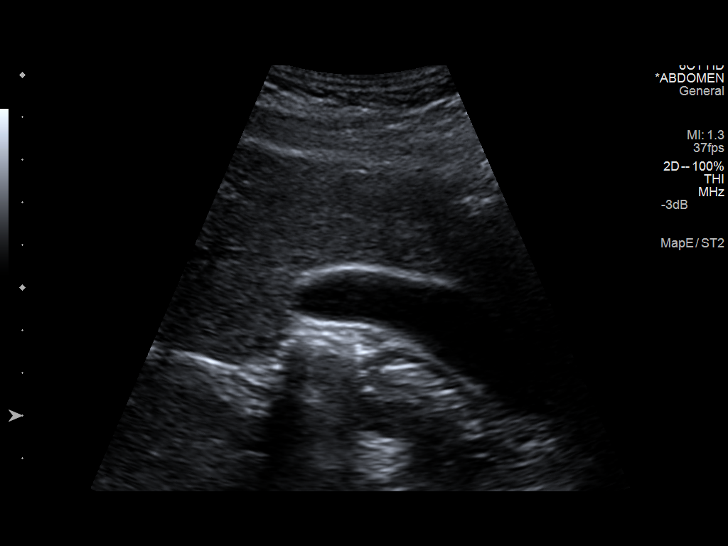
[im 57/76]
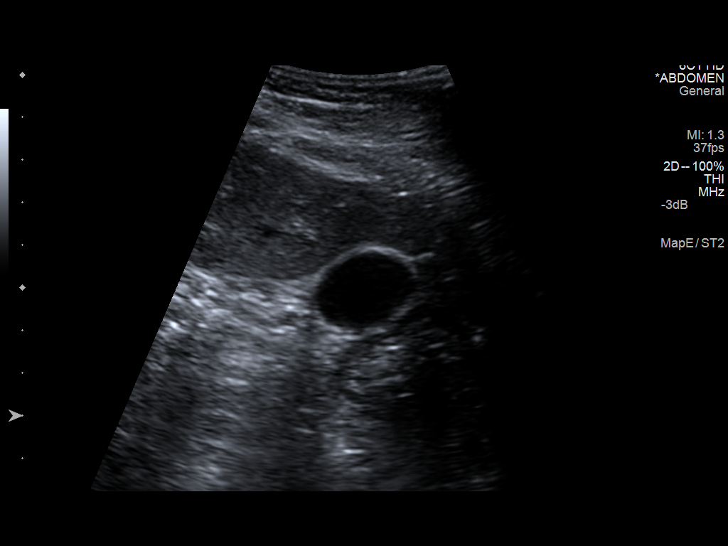
[im 63/76]
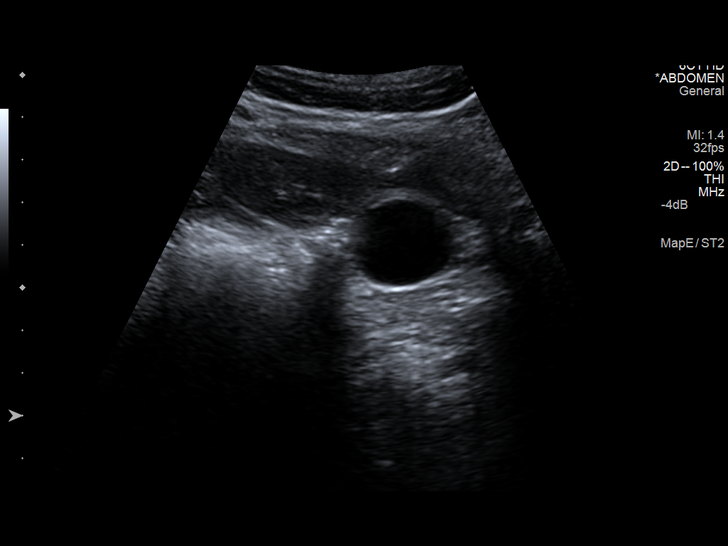
[im 69/76]
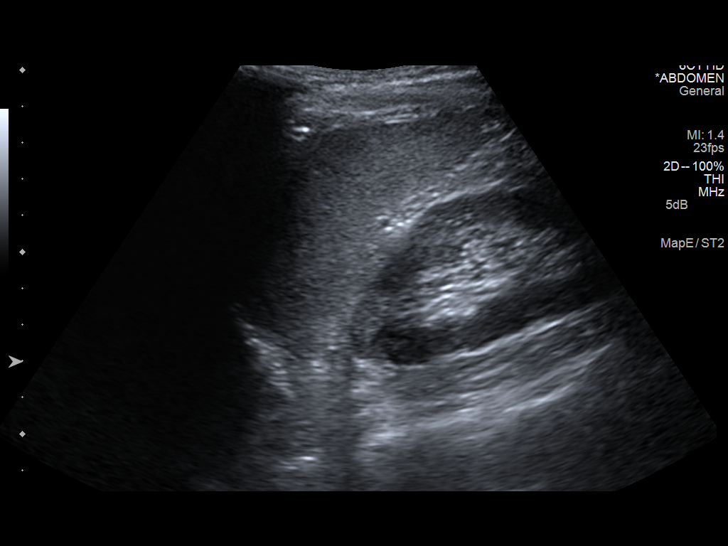
[im 76/76]
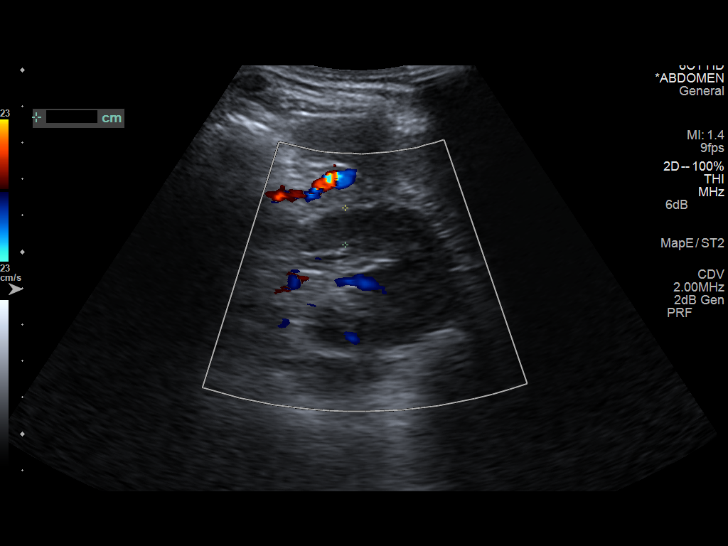

[14 of 25 positions shown; findings below may reference images not displayed]

PROCEDURE:     US  - US ABDOMEN GENERAL SURVEY  - July 26, 2013  [DATE]

RESULT:     The liver exhibits normal echotexture with no focal mass nor
ductal dilation. Portal venous flow is normal in direction toward the liver.
The gallbladder is adequately distended with no evidence of stones, wall
thickening, or pericholecystic fluid. There is no positive sonographic
Murphy's sign. The common bile duct is normal at 1.9 cm in diameter.

The pancreas, spleen, kidneys, abdominal aorta, and inferior vena cava are
normal in appearance. There is no evidence of ascites.
IMPRESSION: Normal abdominal ultrasound examination.

[REDACTED]

## 2015-02-14 NOTE — H&P (Signed)
L&D Evaluation:  History:  HPI -CC: q5-56m UCs -HPI: 29 y/o G1 @ 39/5 (8wk u/s), with above CC. Preg only c/b GBS pos Patient was in the office on 8/25 and was 2-3/70/-1 and membranes stripped. She came in last night for labor eval and was unchanged over two hours and discharged to home. SVE, by RN 4/50/-2 No decreased FM, VB, LOF.   Patient's Medical History No Chronic Illness   Patient's Surgical History none   Medications Pre Natal Vitamins   Allergies Sulfa   Social History none   Family History Non-Contributory   ROS:  ROS as per HPI   Exam:  Vital Signs AF VS normal and stable   General no apparent distress, not painfully contracting   Mental Status clear   Chest CTAB   Heart RRR no MRGs   Abdomen gravid, NTTP, cephalic, [id number] on leopolds   Estimated Fetal Weight Average for gestational age   Fetal Position ceph   Pelvic 4/50/-2 per RN @ 1600   Mebranes Intact   FHT 135 baseline, +accels, no decels, mod var   Ucx q4-67m   Impression:  Impression hopefully transitioning into active labor and GBS pos @ 4cm. patient currently doing well   Plan:  Comments *IUP: rNST *Labor: not in active phase labor yet but since 4cm and GBS pos, pt told that not in active labor yet; given this, g1 status and lives nearby, she was told that if she wanted to stay for augmentation that's fine or if she wanted to go home that's okay, too; she opts for the former. will start amp for ppx and not augment/induce until 2nd dose is in; hopefully by then, pt will be in active labor. okay to come off monitor and do intermittent monitoring. follow up admit labs *GBS pos: will start amp *Analgesia: no needs currently  A pos/RI/VI/rpr pending/hiv neg/hepB neg/tdap utd/pap negative   Electronic Signatures: Aletha Halim (MD)  (Signed 27-Aug-15 16:26)  Authored: L&D Evaluation   Last Updated: 27-Aug-15 16:26 by Aletha Halim (MD)

## 2015-02-14 NOTE — H&P (Signed)
L&D Evaluation:  History:  HPI 29 year old G 1 P0 with EDC=06/04/2014 by a 8wk3d ultrasound presents to L&D with c/o regular contractions since 2PM. She rates the pain of ctxs at 3/10. Denies VB or LOF. Was 2-3 cm in office yesterday and membranes were stripped. Baby active. Prenatal care at [facility] uncomplicated.  First trimester test was negative and  there was a normal anatomy scan LAbs: A POS/RI/VI/ GBS positive   Presents with contractions   Patient's Medical History No Chronic Illness   Patient's Surgical History none   Medications Pre Natal Vitamins   Allergies Sulfa   Social History none   Family History Non-Contributory   ROS:  ROS All systems were reviewed.  HEENT, CNS, GI, GU, Respiratory, CV, Renal and Musculoskeletal systems were found to be normal.   Exam:  Vital Signs stable   General no apparent distress   Abdomen gravid, non-tender   Estimated Fetal Weight Average for gestational age   Fetal Position cephalic   Pelvic no external lesions, 2-3/ballotable-after 2 hours of observation   Mebranes Intact   FHT normal rate with no decels, 125-130 with accelsto 170s   FHT Description mod variability   Ucx q4-7 min apart with coupling, palpate mild-mod   Skin dry   Impression:  Impression IUP at 39 4/7 weeks in prodromal vs false labor. CAT 1 tracing   Plan:  Plan DC home with labor precautions. FU in office as scheduled next week if NIL   Electronic Signatures: Karene Fry (CNM)  (Signed 26-Aug-15 22:36)  Authored: L&D Evaluation   Last Updated: 26-Aug-15 22:36 by Karene Fry (CNM)

## 2015-02-14 NOTE — H&P (Signed)
L&D Evaluation:  History:  HPI pt is a 29yo G1P0 at 27.[redacted] weeks GA with an EDC of 06/04/14 who presents to L&D with reports of pain on the right side for the last several days. 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 She has been doing a lot of lifting, moving, and reaching. She denies urinary symptoms and had a bowel movement today. She denies HA, blurred vision, RUQ pain. She reports the pain as sharp and stabbing. Her prenatal labs are A+/RI/VI.   Presents with other, left abdominal pain   Patient's Medical History No Chronic Illness   Allergies Sulfa   Social History none   Family History Non-Contributory   ROS:  ROS All systems were reviewed.  HEENT, CNS, GI, GU, Respiratory, CV, Renal and Musculoskeletal systems were found to be normal.   Exam:  Vital Signs stable   General no apparent distress   Mental Status clear   Chest clear   Heart normal sinus rhythm   Abdomen gravid, non-tender   Fetal Position transverse on bedside u/s   Pelvic no external lesions   Mebranes Intact   FHT normal rate with no decels, 140-150's gestationally appropriate   Ucx absent   Skin dry, no lesions, no rashes   Lymph no lymphadenopathy   Impression:  Impression IUP at 27.[redacted] weeks GA, probable round ligament pain   Plan:  Plan comfort measures discussed, maternity belt for activity to help with RL pain, follow up in office as scheduled.   Follow Up Appointment already scheduled   Electronic Signatures: Louisa Second (CNM)  (Signed 01-Jun-15 20:19)  Authored: L&D Evaluation   Last Updated: 01-Jun-15 20:19 by Louisa Second (CNM)

## 2015-06-07 LAB — OB RESULTS CONSOLE VARICELLA ZOSTER ANTIBODY, IGG: Varicella: IMMUNE

## 2015-06-07 LAB — OB RESULTS CONSOLE GC/CHLAMYDIA
CHLAMYDIA, DNA PROBE: NEGATIVE
GC PROBE AMP, GENITAL: NEGATIVE

## 2015-06-07 LAB — OB RESULTS CONSOLE HEPATITIS B SURFACE ANTIGEN: Hepatitis B Surface Ag: NEGATIVE

## 2015-06-07 LAB — OB RESULTS CONSOLE RUBELLA ANTIBODY, IGM: RUBELLA: IMMUNE

## 2015-10-08 NOTE — L&D Delivery Note (Signed)
Delivery Note   EGA: 38.1  At 10:36 PM a viable female was delivered via Vaginal, Spontaneous Delivery (Presentation: ROA).  APGAR: 8, 9; weight 7 lb 2.3 oz (3240 g).   Placenta status: Intact, Spontaneous.  Cord: 3 vessels with the following complications: none apparent .  Cord pH: not collected  Anesthesia: Epidural  Episiotomy: None Lacerations:  Vaginal, perineal skin tear (not any degree) Suture Repair: 4.0 vicryl to perineum skin Est. Blood Loss (mL):  50cc  Mom to postpartum.  Baby to Couplet care / Skin to Skin.  Mom SROM'd and presented with contractions, made change but stalled after epidural.  Pitocin was started then mom was complete.  2nd stage was 15 min and delivery was uncomplicated.  Baby was delivered and placed on mom's chest.  Delayed cord clamping performed until cord pulseless, then clamped and FOB cut the cord.  Placenta spontaneously delivered and was intact.  Small hemostatic laceration on right vaginal wall at introitus, and small superficial skin separation of the perineum over intact skin.  This part was repaired subcuticularly.  Baby was attended to by nursing and then we sang Happy Birthday to baby Chelsea Duncan.    Chelsea Duncan C 01/18/2016, 11:07 PM

## 2015-11-13 LAB — OB RESULTS CONSOLE HIV ANTIBODY (ROUTINE TESTING): HIV: NONREACTIVE

## 2015-11-13 LAB — OB RESULTS CONSOLE RPR: RPR: NONREACTIVE

## 2015-12-20 ENCOUNTER — Encounter: Payer: Self-pay | Admitting: Emergency Medicine

## 2015-12-20 ENCOUNTER — Ambulatory Visit
Admission: EM | Admit: 2015-12-20 | Discharge: 2015-12-20 | Disposition: A | Discharge status: Home / Self Care | Payer: BC Managed Care – PPO | Attending: Family Medicine | Admitting: Family Medicine

## 2015-12-20 DIAGNOSIS — B9789 Other viral agents as the cause of diseases classified elsewhere: Principal | ICD-10-CM

## 2015-12-20 DIAGNOSIS — J069 Acute upper respiratory infection, unspecified: Secondary | ICD-10-CM | POA: Diagnosis not present

## 2015-12-20 LAB — RAPID STREP SCREEN (MED CTR MEBANE ONLY): Streptococcus, Group A Screen (Direct): NEGATIVE

## 2015-12-20 LAB — RAPID INFLUENZA A&B ANTIGENS (ARMC ONLY)
INFLUENZA A (ARMC): NEGATIVE
INFLUENZA B (ARMC): NEGATIVE

## 2015-12-20 NOTE — ED Provider Notes (Signed)
CSN: [id number]     Arrival date & time 12/20/15  [id number] History   First MD Initiated Contact with Patient 12/20/15 [phone]     Chief Complaint  Patient presents with  . Sore Throat  . Cough   (Consider location/radiation/quality/duration/timing/severity/associated sxs/prior Treatment) Patient is a 30 y.o. female presenting with URI. The history is provided by the patient.  URI Presenting symptoms: congestion, cough, fever, rhinorrhea and sore throat   Severity:  Moderate Onset quality:  Sudden Duration:  5 days Timing:  Constant Progression:  Unchanged Chronicity:  New Relieved by:  Nothing Ineffective treatments:  OTC medications Associated symptoms: no headaches, no sinus pain and no wheezing   Risk factors: sick contacts   Risk factors: not elderly, no chronic cardiac disease, no chronic kidney disease, no chronic respiratory disease, no diabetes mellitus, no immunosuppression, no recent illness and no recent travel     Past Medical History  Diagnosis Date  . Allergy    Past Surgical History  Procedure Laterality Date  . Bone cyst excision      right foot.   Family History  Problem Relation Age of Onset  . Cancer Mother 68    breast / died at 59  . Hypertension Mother   . Arthritis Father   . Hypertension Sister   . Heart disease Sister     heart murmur  . Birth defects Sister   . Arthritis Maternal Grandmother   . Cancer Paternal Grandmother     lung  . Emphysema Paternal Grandfather    Social History  Substance Use Topics  . Smoking status: Never Smoker   . Smokeless tobacco: Never Used  . Alcohol Use: Yes     Comment: social   OB History    Gravida Para Term Preterm AB TAB SAB Ectopic Multiple Living   1 0 0 0 0 0 0 0 0 0      Review of Systems  Constitutional: Positive for fever.  HENT: Positive for congestion, rhinorrhea and sore throat.   Respiratory: Positive for cough. Negative for wheezing.   Neurological: Negative for headaches.    Allergies   Cefadroxil and Septra  Home Medications   Prior to Admission medications   Medication Sig Start Date End Date Taking? Authorizing Provider  Prenatal Vit-Fe Fumarate-FA (MULTIVITAMIN-PRENATAL) 27-0.8 MG TABS tablet Take 1 tablet by mouth daily at 12 noon.    Historical Provider, MD   Meds Ordered and Administered this Visit  Medications - No data to display  BP 108/67 mmHg  Pulse 78  Temp(Src) 97.2 F (36.2 C) (Tympanic)  Resp 16  Ht 5\' 3"  (1.6 m)  Wt 134 lb (60.782 kg)  BMI 23.74 kg/m2  SpO2 98% No data found.   Physical Exam  Constitutional: She appears well-developed and well-nourished. No distress.  HENT:  Head: Normocephalic and atraumatic.  Right Ear: Tympanic membrane, external ear and ear canal normal.  Left Ear: Tympanic membrane, external ear and ear canal normal.  Nose: Rhinorrhea present. No nose lacerations, sinus tenderness, nasal deformity, septal deviation or nasal septal hematoma. No epistaxis.  No foreign bodies.  Mouth/Throat: Uvula is midline, oropharynx is clear and moist and mucous membranes are normal. No oropharyngeal exudate.  Eyes: Conjunctivae and EOM are normal. Pupils are equal, round, and reactive to light. Right eye exhibits no discharge. Left eye exhibits no discharge. No scleral icterus.  Neck: Normal range of motion. Neck supple. No thyromegaly present.  Cardiovascular: Normal rate, regular rhythm and normal heart sounds.  Pulmonary/Chest: Effort normal and breath sounds normal. No respiratory distress. She has no wheezes. She has no rales.  Lymphadenopathy:    She has no cervical adenopathy.  Skin: She is not diaphoretic.  Nursing note and vitals reviewed.   ED Course  Procedures (including critical care time)  Labs Review Labs Reviewed  RAPID STREP SCREEN (NOT AT [facility])  RAPID INFLUENZA A&B ANTIGENS ([facility] ONLY)  CULTURE, GROUP A STREP [facility])    Imaging Review No results found.   Visual Acuity Review  Right Eye Distance:    Left Eye Distance:   Bilateral Distance:    Right Eye Near:   Left Eye Near:    Bilateral Near:         MDM   1. Viral URI with cough    Discharge Medication List as of 12/20/2015  9:27 AM     1. Lab results (negative strep/flu) and diagnosis reviewed with patient 2. Recommend supportive treatment with increased fluids, rest, otc tylenol prn 3. Follow-up prn if symptoms worsen or don't improve    Norval Gable, MD 12/20/15 1219

## 2015-12-20 NOTE — ED Notes (Signed)
Patient c/o cough, sore throat since Friday night.  Patient reports fevers.

## 2015-12-22 LAB — CULTURE, GROUP A STREP (THRC)

## 2016-01-18 ENCOUNTER — Inpatient Hospital Stay
Admission: EM | Admit: 2016-01-18 | Discharge: 2016-01-20 | DRG: 775 | Disposition: A | Discharge status: Home / Self Care | Payer: BC Managed Care – PPO | Attending: Obstetrics & Gynecology | Admitting: Obstetrics & Gynecology

## 2016-01-18 ENCOUNTER — Inpatient Hospital Stay: Payer: BC Managed Care – PPO | Admitting: Anesthesiology

## 2016-01-18 ENCOUNTER — Encounter: Payer: Self-pay | Admitting: *Deleted

## 2016-01-18 DIAGNOSIS — Z3A38 38 weeks gestation of pregnancy: Secondary | ICD-10-CM

## 2016-01-18 DIAGNOSIS — Z3483 Encounter for supervision of other normal pregnancy, third trimester: Secondary | ICD-10-CM | POA: Diagnosis present

## 2016-01-18 HISTORY — DX: Other complications of anesthesia, initial encounter: T88.59XA

## 2016-01-18 HISTORY — DX: Nausea with vomiting, unspecified: R11.2

## 2016-01-18 HISTORY — DX: Adverse effect of unspecified anesthetic, initial encounter: T41.45XA

## 2016-01-18 HISTORY — DX: Other specified postprocedural states: Z98.890

## 2016-01-18 LAB — CBC
HEMATOCRIT: 38.3 % (ref 35.0–47.0)
Hemoglobin: 13.1 g/dL (ref 12.0–16.0)
MCH: 31.9 pg (ref 26.0–34.0)
MCHC: 34.2 g/dL (ref 32.0–36.0)
MCV: 93.3 fL (ref 80.0–100.0)
Platelets: 199 10*3/uL (ref 150–440)
RBC: 4.1 MIL/uL (ref 3.80–5.20)
RDW: 13.7 % (ref 11.5–14.5)
WBC: 10.4 10*3/uL (ref 3.6–11.0)

## 2016-01-18 LAB — ABO/RH: ABO/RH(D): A POS

## 2016-01-18 LAB — TYPE AND SCREEN
ABO/RH(D): A POS
Antibody Screen: NEGATIVE

## 2016-01-18 MED ORDER — OXYTOCIN BOLUS FROM INFUSION: 500.0000 mL | INTRAVENOUS | Status: DC

## 2016-01-18 MED ORDER — LIDOCAINE HCL (PF) 1 % IJ SOLN
INTRAMUSCULAR | Status: AC
  Filled 2016-01-18: qty 30

## 2016-01-18 MED ORDER — LACTATED RINGERS IV SOLN
INTRAVENOUS | Status: DC
  Administered 2016-01-18: 16:00:00 via INTRAVENOUS

## 2016-01-18 MED ORDER — AMMONIA AROMATIC IN INHA
RESPIRATORY_TRACT | Status: AC
  Filled 2016-01-18: qty 10

## 2016-01-18 MED ORDER — EPHEDRINE 5 MG/ML INJ
10.0000 mg | INTRAVENOUS | Status: DC | PRN
  Filled 2016-01-18: qty 2

## 2016-01-18 MED ORDER — ACETAMINOPHEN 325 MG PO TABS: 650.0000 mg | ORAL_TABLET | ORAL | Status: DC | PRN

## 2016-01-18 MED ORDER — OXYTOCIN 40 UNITS IN LACTATED RINGERS INFUSION - SIMPLE MED
1.0000 m[IU]/min | INTRAVENOUS | Status: DC
  Administered 2016-01-18: 1 m[IU]/min via INTRAVENOUS

## 2016-01-18 MED ORDER — PHENYLEPHRINE 40 MCG/ML (10ML) SYRINGE FOR IV PUSH (FOR BLOOD PRESSURE SUPPORT): 80.0000 ug | PREFILLED_SYRINGE | INTRAVENOUS | Status: DC | PRN

## 2016-01-18 MED ORDER — BUTORPHANOL TARTRATE 1 MG/ML IJ SOLN: 1.0000 mg | INTRAMUSCULAR | Status: DC | PRN

## 2016-01-18 MED ORDER — LIDOCAINE HCL (PF) 2 % IJ SOLN
INTRAMUSCULAR | Status: DC | PRN
  Administered 2016-01-18: 8 mL via INTRADERMAL

## 2016-01-18 MED ORDER — PHENYLEPHRINE 40 MCG/ML (10ML) SYRINGE FOR IV PUSH (FOR BLOOD PRESSURE SUPPORT)
80.0000 ug | PREFILLED_SYRINGE | INTRAVENOUS | Status: DC | PRN
  Filled 2016-01-18: qty 2

## 2016-01-18 MED ORDER — EPHEDRINE 5 MG/ML INJ: 10.0000 mg | INTRAVENOUS | Status: DC | PRN

## 2016-01-18 MED ORDER — LACTATED RINGERS IV SOLN: 500.0000 mL | INTRAVENOUS | Status: DC | PRN

## 2016-01-18 MED ORDER — OXYTOCIN 10 UNIT/ML IJ SOLN
INTRAMUSCULAR | Status: AC
  Filled 2016-01-18: qty 2

## 2016-01-18 MED ORDER — OXYTOCIN 40 UNITS IN LACTATED RINGERS INFUSION - SIMPLE MED
2.5000 [IU]/h | INTRAVENOUS | Status: DC
  Filled 2016-01-18: qty 1000

## 2016-01-18 MED ORDER — ONDANSETRON HCL 4 MG/2ML IJ SOLN: 4.0000 mg | Freq: Four times a day (QID) | INTRAMUSCULAR | Status: DC | PRN

## 2016-01-18 MED ORDER — CITRIC ACID-SODIUM CITRATE 334-500 MG/5ML PO SOLN: 30.0000 mL | ORAL | Status: DC | PRN

## 2016-01-18 MED ORDER — LIDOCAINE HCL (PF) 1 % IJ SOLN: 30.0000 mL | INTRAMUSCULAR | Status: DC | PRN

## 2016-01-18 MED ORDER — LIDOCAINE-EPINEPHRINE (PF) 1.5 %-1:200000 IJ SOLN
INTRAMUSCULAR | Status: DC | PRN
  Administered 2016-01-18: 3 mL via PERINEURAL

## 2016-01-18 MED ORDER — LACTATED RINGERS IV SOLN: 500.0000 mL | Freq: Once | INTRAVENOUS | Status: DC

## 2016-01-18 MED ORDER — BUPIVACAINE HCL (PF) 0.25 % IJ SOLN
INTRAMUSCULAR | Status: DC | PRN
  Administered 2016-01-18: 9 mL via EPIDURAL

## 2016-01-18 MED ORDER — FENTANYL 2.5 MCG/ML W/ROPIVACAINE 0.2% IN NS 100 ML EPIDURAL INFUSION (ARMC-ANES)
10.0000 mL/h | EPIDURAL | Status: DC
  Administered 2016-01-18: 10 mL/h via EPIDURAL

## 2016-01-18 MED ORDER — FENTANYL 2.5 MCG/ML W/ROPIVACAINE 0.2% IN NS 100 ML EPIDURAL INFUSION (ARMC-ANES)
EPIDURAL | Status: AC
  Administered 2016-01-18: 10 mL/h via EPIDURAL
  Filled 2016-01-18: qty 100

## 2016-01-18 MED ORDER — DIPHENHYDRAMINE HCL 50 MG/ML IJ SOLN: 12.5000 mg | INTRAMUSCULAR | Status: DC | PRN

## 2016-01-18 MED ORDER — MISOPROSTOL 200 MCG PO TABS
ORAL_TABLET | ORAL | Status: DC
Start: 2016-01-18 — End: 2016-01-18
  Filled 2016-01-18: qty 4

## 2016-01-18 NOTE — H&P (Signed)
OB History & Physical   History of Present Illness:  Chief Complaint:   HPI:  Chelsea Duncan is a 30 y.o. G2P0000 female at [redacted]w[redacted]d dated by 7 weel ultrasound with EDC of 01/31/16.  She presents to L&D with gross rupture of membranes and contractions.   +FM, + CTX, + LOF, no VB  Pregnancy Issues: none  Maternal Medical History:   Past Medical History  Diagnosis Date  . Allergy   . Complication of anesthesia   . PONV (postoperative nausea and vomiting)     Past Surgical History  Procedure Laterality Date  . Bone cyst excision      right foot.    Allergies  Allergen Reactions  . Cefadroxil Nausea And Vomiting  . Septra [Sulfamethoxazole-Trimethoprim] Nausea And Vomiting    Prior to Admission medications   Medication Sig Start Date End Date Taking? Authorizing Provider  Prenatal Vit-Fe Fumarate-FA (MULTIVITAMIN-PRENATAL) 27-0.8 MG TABS tablet Take 1 tablet by mouth daily at 12 noon.    Historical Provider, MD     Prenatal care site: [facility]   Social History: She  reports that she has never smoked. She has never used smokeless tobacco. She reports that she drinks alcohol. She reports that she does not use illicit drugs.  Family History: family history includes Arthritis in her father and maternal grandmother; Birth defects in her sister; Cancer in her paternal grandmother; Cancer (age of onset: 70) in her mother; Emphysema in her paternal grandfather; Heart disease in her sister; Hypertension in her mother and sister.   Review of Systems: Negative x 10 systems reviewed except as noted in the HPI.     Physical Exam:  Vital Signs: BP 118/66 mmHg  Pulse 75  Temp(Src) 97.9 F (36.6 C) (Oral)  Resp 16  Ht 5\' 4"  (1.626 m)  Wt 61.689 kg (136 lb)  BMI 23.33 kg/m2 General: no acute distress.  HEENT: normocephalic, atraumatic Heart: regular rate & rhythm.  No murmurs/rubs/gallops Lungs: clear to auscultation bilaterally, normal respiratory effort Abdomen: soft,  gravid, non-tender;  EFW: 7.2 Pelvic:   External: Normal external female genitalia  Cervix: Dilation: 3 / Effacement (%): 80 / Station: +1    Extremities: non-tender, symmetric, 0 edema bilaterally.  DTRs: 2+ Neurologic: Alert & oriented x 3.    Results for orders placed or performed during the hospital encounter of 01/18/16 (from the past 24 hour(s))  CBC     Status: None   Collection Time: 01/18/16  4:10 PM  Result Value Ref Range   WBC 10.4 3.6 - 11.0 K/uL   RBC 4.10 3.80 - 5.20 MIL/uL   Hemoglobin 13.1 12.0 - 16.0 g/dL   HCT 38.3 35.0 - 47.0 %   MCV 93.3 80.0 - 100.0 fL   MCH 31.9 26.0 - 34.0 pg   MCHC 34.2 32.0 - 36.0 g/dL   RDW 13.7 11.5 - 14.5 %   Platelets 199 150 - 440 K/uL  Type and screen Chelsea Duncan     Status: None   Collection Time: 01/18/16  4:10 PM  Result Value Ref Range   ABO/RH(D) A POS    Antibody Screen NEG    Sample Expiration 01/21/2016     Pertinent Results:  Prenatal Labs: Blood type/Rh A pos  Antibody screen neg  Rubella Immune  Varicella Immune  RPR NR  HBsAg Neg  HIV NR  GC neg  Chlamydia neg  Genetic screening negative  1 hour GTT 69  3 hour GTT  GBS neg   FHT: 125 mod + accels no decels TOCO: q6-53min SVE: [id number]  Cephalic by leopolds  Assessment:  Chelsea Duncan is a 30 y.o. G2P0000 female at [redacted]w[redacted]d with SROM.   Plan:  1. Admit to Labor & Delivery 2. CBC, T&S, Clrs, IVF 3. GBS  neg 4. Consents obtained. 5. Continuous efm/toco 6. Expectant management  7. Epidural upon request  ----- Larey Days, MD Attending Obstetrician and Gynecologist [facility]

## 2016-01-18 NOTE — Anesthesia Preprocedure Evaluation (Addendum)
Anesthesia Evaluation  Patient identified by MRN, date of birth, ID band Patient awake    Reviewed: Allergy & Precautions, NPO status , Patient's Chart, lab work & pertinent test results  History of Anesthesia Complications (+) PONV and history of anesthetic complications  Airway Mallampati: II  TM Distance: >3 FB     Dental no notable dental hx.    Pulmonary neg pulmonary ROS,    Pulmonary exam normal        Cardiovascular negative cardio ROS Normal cardiovascular exam     Neuro/Psych negative neurological ROS  negative psych ROS   GI/Hepatic negative GI ROS, Neg liver ROS,   Endo/Other  negative endocrine ROS  Renal/GU negative Renal ROS  negative genitourinary   Musculoskeletal   Abdominal Normal abdominal exam  (+)   Peds negative pediatric ROS (+)  Hematology negative hematology ROS (+)   Anesthesia Other Findings Possible scoliosis with some winging of the right scapula.  Patient stated that the last epidural was more of a one sided block.  Reproductive/Obstetrics (+) Pregnancy                            Anesthesia Physical Anesthesia Plan  ASA: II  Anesthesia Plan: Epidural   Post-op Pain Management:    Induction:   Airway Management Planned: Natural Airway  Additional Equipment:   Intra-op Plan:   Post-operative Plan:   Informed Consent: I have reviewed the patients History and Physical, chart, labs and discussed the procedure including the risks, benefits and alternatives for the proposed anesthesia with the patient or authorized representative who has indicated his/her understanding and acceptance.   Dental advisory given  Plan Discussed with: CRNA and Surgeon  Anesthesia Plan Comments:         Anesthesia Quick Evaluation

## 2016-01-18 NOTE — Discharge Summary (Signed)
Obstetrical Discharge Summary  Patient Name: Chelsea Duncan DOB: 1985-11-13 MRN: [id number]  Date of Admission: 01/18/2016 Date of Discharge: 01/20/16  Primary OB: [facility]   Gestational Age at Delivery: [redacted]w[redacted]d   Antepartum complications: none Admitting Diagnosis:  Spontaneous rupture of membranes at term Secondary Diagnosis: Patient Active Problem List   Diagnosis Date Noted  . Labor and delivery, indication for care 01/18/2016  . Breast mass, right 10/05/2013  . Pregnant state, incidental 10/05/2013  . Family history of breast cancer 07/01/2013    Augmentation: Pitocin Complications: None Intrapartum complications/course:  Date of Delivery: 01/18/16 Delivered By: Vikki Ports Ward Delivery Type: spontaneous vaginal delivery Anesthesia: epidural Placenta: sponatneous Laceration: right vaginal, superficial perineal  Episiotomy: none Newborn Data: Live born female  Birth Weight: 7 lb 2.3 oz (3240 g) APGAR: 8, 9    Discharge Physical Exam:  BP 113/75 mmHg  Pulse 84  Temp(Src) 98.3 F (36.8 C) (Oral)  Resp 19  Ht 5\' 4"  (1.626 m)  Wt 61.689 kg (136 lb)  BMI 23.33 kg/m2  SpO2 100%  Breastfeeding? Unknown  General: NAD CV: RRR Pulm: CTABL, nl effort ABD: s/nd/nt, fundus firm and below the umbilicus Lochia: moderate DVT Evaluation: LE non-ttp, no evidence of DVT on exam.  HEMOGLOBIN  Date Value Ref Range Status  01/18/2016 13.1 12.0 - 16.0 g/dL Final   HGB  Date Value Ref Range Status  06/02/2014 13.4 12.0-16.0 g/dL Final   HCT  Date Value Ref Range Status  01/18/2016 38.3 35.0 - 47.0 % Final  06/04/2014 35.7 35.0-47.0 % Final    Post partum course: Normal PP course Postpartum Procedures: None Disposition: stable, discharge to home. Baby Feeding: breastmilk+formula Baby Disposition: home with mom  Rh Immune globulin given: n/a Rubella vaccine given: n/a Tdap vaccine given in AP or PP setting: AP Flu vaccine given in AP or PP setting:  n/a  Contraception: IUD planned  Prenatal Labs:  Blood type/Rh A pos  Antibody screen neg  Rubella Immune  Varicella Immune  RPR NR  HBsAg Neg  HIV NR  GC neg  Chlamydia neg  Genetic screening negative  1 hour GTT 69  3 hour GTT   GBS neg          Plan:  Chelsea Duncan was discharged to home in good condition. Follow-up appointment at [facility] with Dr Leonides Schanz in 6 weeks   Discharge Medications:   Medication List    TAKE these medications        multivitamin-prenatal 27-0.8 MG Tabs tablet  Take 1 tablet by mouth daily at 12 noon.             Follow-up Information    Follow up with Ward, Honor Loh, MD In 6 weeks.   Specialty:  Obstetrics and Gynecology   Why:  or with Dr. Glennon Mac - for post partum check   Contact information:   [address] [place] [address] [phone]

## 2016-01-18 NOTE — Anesthesia Procedure Notes (Addendum)
Epidural  Start time: 01/18/2016 7:15 PM End time: 01/18/2016 7:31 PM  Staffing Anesthesiologist: Alvin Critchley Performed by: anesthesiologist   Preanesthetic Checklist Completed: patient identified, site marked, surgical consent, pre-op evaluation, timeout performed, IV checked, risks and benefits discussed and monitors and equipment checked  Epidural Patient position: sitting Prep: Betadine and site prepped and draped Patient monitoring: heart rate, cardiac monitor, continuous pulse ox and blood pressure Approach: midline Location: L3-L4 Injection technique: LOR air  Needle:  Needle type: Tuohy  Needle gauge: 18 G Needle length: 9 cm Catheter type: closed end Catheter size: 20 Guage Test dose: negative and 1.5% lidocaine with Epi 1:200 K  Assessment Sensory level: T8  Additional Notes Time out called.  Patient placed in sitting position.  Noted mild winging of right scapula with right shoulder higher than the left..  Patient's back prepped and draped in sterile fashion.  A skin wheal was made in the L3-L4 interspace with 1% Lidocaine plain.  An 62 G Tuohy needle was guided into the epidural space , after some redirection, into the epidural space by a loss of resistance technique.  An epidural catheter was threaded  3 cm into the epidural space and the TD was negative.  The patient tolerated the procedure well.  The catheter was fixed to the back in a sterile fashion.Reason for block:procedure for pain  No blood or paresthesias noted.

## 2016-01-19 LAB — CBC
HCT: 36.2 % (ref 35.0–47.0)
Hemoglobin: 12.3 g/dL (ref 12.0–16.0)
MCH: 31.9 pg (ref 26.0–34.0)
MCHC: 34.1 g/dL (ref 32.0–36.0)
MCV: 93.6 fL (ref 80.0–100.0)
PLATELETS: 199 10*3/uL (ref 150–440)
RBC: 3.87 MIL/uL (ref 3.80–5.20)
RDW: 13.4 % (ref 11.5–14.5)
WBC: 15.8 10*3/uL — ABNORMAL HIGH (ref 3.6–11.0)

## 2016-01-19 LAB — RPR: RPR: NONREACTIVE

## 2016-01-19 MED ORDER — SIMETHICONE 80 MG PO CHEW: 80.0000 mg | CHEWABLE_TABLET | ORAL | Status: DC | PRN

## 2016-01-19 MED ORDER — PRENATAL MULTIVITAMIN CH
1.0000 | ORAL_TABLET | Freq: Every day | ORAL | Status: DC
  Administered 2016-01-19 – 2016-01-20 (×2): 1 via ORAL
  Filled 2016-01-19 (×2): qty 1

## 2016-01-19 MED ORDER — BENZOCAINE-MENTHOL 20-0.5 % EX AERO
1.0000 "application " | INHALATION_SPRAY | CUTANEOUS | Status: DC | PRN
  Filled 2016-01-19: qty 56

## 2016-01-19 MED ORDER — ACETAMINOPHEN 325 MG PO TABS: 650.0000 mg | ORAL_TABLET | ORAL | Status: DC | PRN

## 2016-01-19 MED ORDER — DIBUCAINE 1 % RE OINT: 1.0000 "application " | TOPICAL_OINTMENT | RECTAL | Status: DC | PRN

## 2016-01-19 MED ORDER — IBUPROFEN 600 MG PO TABS
600.0000 mg | ORAL_TABLET | Freq: Four times a day (QID) | ORAL | Status: DC
  Administered 2016-01-19 – 2016-01-20 (×5): 600 mg via ORAL
  Filled 2016-01-19 (×5): qty 1

## 2016-01-19 MED ORDER — ONDANSETRON HCL 4 MG PO TABS: 4.0000 mg | ORAL_TABLET | ORAL | Status: DC | PRN

## 2016-01-19 MED ORDER — WITCH HAZEL-GLYCERIN EX PADS: 1.0000 "application " | MEDICATED_PAD | CUTANEOUS | Status: DC | PRN

## 2016-01-19 MED ORDER — COCONUT OIL OIL
1.0000 "application " | TOPICAL_OIL | Status: DC | PRN
  Filled 2016-01-19: qty 120

## 2016-01-19 MED ORDER — DIPHENHYDRAMINE HCL 25 MG PO CAPS: 25.0000 mg | ORAL_CAPSULE | Freq: Four times a day (QID) | ORAL | Status: DC | PRN

## 2016-01-19 MED ORDER — ONDANSETRON HCL 4 MG/2ML IJ SOLN: 4.0000 mg | INTRAMUSCULAR | Status: DC | PRN

## 2016-01-19 MED ORDER — DOCUSATE SODIUM 100 MG PO CAPS
100.0000 mg | ORAL_CAPSULE | Freq: Two times a day (BID) | ORAL | Status: DC
  Administered 2016-01-19 – 2016-01-20 (×3): 100 mg via ORAL
  Filled 2016-01-19 (×3): qty 1

## 2016-01-19 NOTE — Anesthesia Post-op Follow-up Note (Signed)
  Anesthesia Pain Follow-up Note  Patient: Chelsea Duncan  Day #: 1  Date of Follow-up: 01/19/2016 Time: 7:32 AM  Last Vitals:  Filed Vitals:   01/19/16 0156 01/19/16 0606  BP: 107/59 113/60  Pulse: 68 55  Temp: 36.7 C   Resp: 20 20    Level of Consciousness: alert  Pain: mild   Side Effects:None    Catheter Site Exam: site not evaluated Plan: D/C from anesthesia care  Blima Singer

## 2016-01-19 NOTE — Progress Notes (Signed)
Patient ID: Chelsea Duncan, female   DOB: 09/01/1986, 30 y.o.   MRN: [id number] Obstetric Postpartum Daily Progress Note Subjective:  30 y.o. G2P1001 postpartum day #1 status post vaginal delivery.  She is ambulating, is tolerating po, is voiding spontaneously.  Her pain is well controlled on PO pain medications. Her lochia is less than menses.   Medications SCHEDULED MEDICATIONS  . docusate sodium  100 mg Oral BID  . ibuprofen  600 mg Oral 4 times per day  . lactated ringers  500 mL Intravenous Once  . prenatal multivitamin  1 tablet Oral Q1200    MEDICATION INFUSIONS  . fentanyl 2.5 mcg/ml w/ropivacaine 0.2% in normal saline 100 mL EPIDURAL Infusion Stopped (01/18/16 2300)  . lactated ringers 125 mL/hr at 01/18/16 1609  . oxytocin 3 milli-units/min (01/18/16 2200)    PRN MEDICATIONS  acetaminophen, benzocaine-Menthol, coconut oil, witch hazel-glycerin **AND** dibucaine, diphenhydrAMINE, diphenhydrAMINE, ePHEDrine, ondansetron **OR** ondansetron (ZOFRAN) IV, phenylephrine, simethicone    Objective:   Filed Vitals:   01/19/16 0045 01/19/16 0156 01/19/16 0606 01/19/16 0802  BP: 118/85 107/59 113/60 113/63  Pulse: 86 68 55 52  Temp:  98 F (36.7 C)  97.8 F (36.6 C)  TempSrc:  Oral  Oral  Resp:  20 20 20   Height:      Weight:      SpO2:    99%    Current Vital Signs 24h Vital Sign Ranges  T 97.8 F (36.6 C) Temp  Avg: 98.1 F (36.7 C)  Min: 97.8 F (36.6 C)  Max: 98.5 F (36.9 C)  BP 113/63 mmHg BP  Min: 103/57  Max: 125/73  HR (!) 52 Pulse  Avg: 77.2  Min: 52  Max: 100  RR 20 Resp  Avg: 19  Min: 16  Max: 20  SaO2 99 % Not Delivered SpO2  Avg: 99.9 %  Min: 99 %  Max: 100 %       24 Hour I/O Current Shift I/O  Time Ins Outs 04/13 0701 - 04/14 0700 In: 1171.9 [I.V.:1171.9] Out: 300 [Urine:300] 04/14 0701 - 04/14 1900 In: 293 [I.V.:293] Out: -   General: NAD Pulmonary: no increased work of breathing Abdomen: non-distended, non-tender, fundus firm at level of  umbilicus Extremities: no edema, no erythema, no tenderness  Labs:   Recent Labs Lab 01/18/16 1610 01/19/16 0350  WBC 10.4 15.8*  HGB 13.1 12.3  HCT 38.3 36.2  PLT 199 199     Assessment:   30 y.o. G2P1001 postpartum day # 1 status post spontaneous vaginal delivery, doing well  Plan:   1) A POS / Rubella Immune (08/31 0000)/ Varicella Immune  2) TDAP status up to date  3) breast /Contraception = IUD  4) Disposition: home tomorrow  Prentice Docker, MD 01/19/2016 9:31 AM

## 2016-01-19 NOTE — Anesthesia Postprocedure Evaluation (Signed)
Anesthesia Post Note  Patient: Chelsea Duncan  Procedure(s) Performed: * No procedures listed *  Patient location during evaluation: Mother Baby Anesthesia Type: Epidural Level of consciousness: awake and alert and oriented Pain management: satisfactory to patient Vital Signs Assessment: post-procedure vital signs reviewed and stable Respiratory status: respiratory function stable Cardiovascular status: stable Postop Assessment: no backache, no headache, epidural receding, no signs of nausea or vomiting, patient able to bend at knees and adequate PO intake Anesthetic complications: no    Last Vitals:  Filed Vitals:   01/19/16 0156 01/19/16 0606  BP: 107/59 113/60  Pulse: 68 55  Temp: 36.7 C   Resp: 20 20    Last Pain:  Filed Vitals:   01/19/16 0606  PainSc: 1                  Blima Singer

## 2016-01-19 NOTE — Lactation Note (Signed)
This note was copied from a baby's chart. Lactation Consultation Note  Patient Name: Chelsea Duncan [id number] Date: 01/19/2016     Maternal Data  Mom requests a pacifier for baby, baby sucking on hand, fed 30 min on left breast, advised pt to breastfeed on right breast, baby fussy at breast and arches back unable to coordinate latch, and nipple inverts easily, mom doesn't want baby to cry and does not want to continue to try to latch baby, requests pacifier, CNA Melissa to bring mom a pacifier    Feeding Feeding Type: Breast Fed  [facility] Score/Interventions                      Lactation Tools Discussed/Used     Consult Status      Ferol Luz 01/19/2016, 5:40 PM

## 2016-01-20 NOTE — Progress Notes (Signed)
Pt states that she received TDaP vaccine during pregnancy.  Pt declines vaccine at this time.  Reed Breech, RN 01/20/2016 11:51 AM

## 2016-01-20 NOTE — Discharge Instructions (Signed)
Discharge instructions:   Call office if you have any of the following: headache, visual changes, fever >101.0 F, chills, breast concerns, excessive vaginal bleeding, incision drainage or problems, leg pain or redness, depression or any other concerns.   No strenuous activity or heavy lifting for 6 weeks.  No intercourse, tampons, douching, or enemas for 6 weeks.  No tub baths-showers only.  No driving for 2 weeks or while taking pain medications.  Continue prenatal vitamin and iron.  Increase calories and fluids while breastfeeding.  You may have a slight fever when your milk comes in, but it should go away on its own.  If it does not, and rises above 101.0 please call the doctor.  For concerns about your baby, please call your pediatrician For breastfeeding concerns, the lactation consultant can be reached at [phone]  Call your doctor for increased pain or vaginal bleeding, temperature above 100.4, depression, or concerns.  No strenuous activity or heavy lifting for 6 weeks.  No intercourse, tampons, douching, or enemas for 6 weeks.  No tub baths-showers only.  No driving for 2 weeks or while taking pain medications.  Continue prenatal vitamin and iron.  Increase calories and fluids while breastfeeding.

## 2016-01-20 NOTE — Progress Notes (Signed)
Discharge instructions provided.  Pt and sig other verbalize understanding of all instructions and follow-up care.  Pt discharged to home with infant at 1305 on 01/20/16 via wheelchair by CNA. Reed Breech, RN 01/20/2016 1:09 PM

## 2016-03-07 DIAGNOSIS — Z1371 Encounter for nonprocreative screening for genetic disease carrier status: Secondary | ICD-10-CM

## 2016-03-07 DIAGNOSIS — Z803 Family history of malignant neoplasm of breast: Secondary | ICD-10-CM

## 2016-03-07 HISTORY — DX: Encounter for nonprocreative screening for genetic disease carrier status: Z13.71

## 2016-03-07 HISTORY — DX: Family history of malignant neoplasm of breast: Z80.3

## 2016-06-12 ENCOUNTER — Emergency Department
Admission: EM | Admit: 2016-06-12 | Discharge: 2016-06-12 | Disposition: A | Discharge status: Home / Self Care | Payer: BC Managed Care – PPO | Attending: Student in an Organized Health Care Education/Training Program | Admitting: Student in an Organized Health Care Education/Training Program

## 2016-06-12 DIAGNOSIS — Y999 Unspecified external cause status: Secondary | ICD-10-CM | POA: Diagnosis not present

## 2016-06-12 DIAGNOSIS — X58XXXA Exposure to other specified factors, initial encounter: Secondary | ICD-10-CM | POA: Insufficient documentation

## 2016-06-12 DIAGNOSIS — Y9301 Activity, walking, marching and hiking: Secondary | ICD-10-CM | POA: Insufficient documentation

## 2016-06-12 DIAGNOSIS — Z853 Personal history of malignant neoplasm of breast: Secondary | ICD-10-CM | POA: Insufficient documentation

## 2016-06-12 DIAGNOSIS — T63421A Toxic effect of venom of ants, accidental (unintentional), initial encounter: Secondary | ICD-10-CM | POA: Diagnosis not present

## 2016-06-12 DIAGNOSIS — Y929 Unspecified place or not applicable: Secondary | ICD-10-CM | POA: Diagnosis not present

## 2016-06-12 DIAGNOSIS — T7840XA Allergy, unspecified, initial encounter: Secondary | ICD-10-CM

## 2016-06-12 DIAGNOSIS — L509 Urticaria, unspecified: Secondary | ICD-10-CM | POA: Diagnosis present

## 2016-06-12 MED ORDER — DIPHENHYDRAMINE HCL 50 MG/ML IJ SOLN
25.0000 mg | Freq: Once | INTRAMUSCULAR | Status: AC
  Administered 2016-06-12: 25 mg via INTRAVENOUS
  Filled 2016-06-12: qty 1

## 2016-06-12 MED ORDER — PREDNISONE 20 MG PO TABS: 40.0000 mg | ORAL_TABLET | Freq: Every day | ORAL | 0 refills | Status: AC

## 2016-06-12 MED ORDER — METHYLPREDNISOLONE SODIUM SUCC 125 MG IJ SOLR
125.0000 mg | Freq: Once | INTRAMUSCULAR | Status: AC
  Administered 2016-06-12: 125 mg via INTRAVENOUS
  Filled 2016-06-12: qty 2

## 2016-06-12 MED ORDER — EPINEPHRINE 0.3 MG/0.3ML IJ SOAJ
0.3000 mg | Freq: Once | INTRAMUSCULAR | Status: AC
  Administered 2016-06-12: 0.3 mg via INTRAMUSCULAR

## 2016-06-12 MED ORDER — EPINEPHRINE 0.3 MG/0.3ML IJ SOAJ: 0.3000 mg | Freq: Once | INTRAMUSCULAR | 0 refills | Status: AC

## 2016-06-12 NOTE — ED Provider Notes (Signed)
[facility] Emergency Department Provider Note    First MD Initiated Contact with Patient 06/12/16 1833     (approximate)  I have reviewed the triage vital signs and the nursing notes.   HISTORY  Chief Complaint No chief complaint on file.    HPI Chelsea Duncan is a 30 y.o. female presents with diffuse urticaria and burning sensation on her face with lip and tongue swelling after being bit by over 30 fire aunt's roughly 30 minutes prior to arrival. Patient states that she was bit by one fire about 1 week prior and had significant swelling to her right foot. Patient was walking to get the mail this evening got back in the car and noted that she had multiple fire ants on her legs with multiple bites. She took Benadryl prior to arrival but felt that her lives first swelling. No other known allergies. She currently denies any shortness of breath or some throat tightening.   Past Medical History:  Diagnosis Date  . Allergy   . Complication of anesthesia   . PONV (postoperative nausea and vomiting)     Patient Active Problem List   Diagnosis Date Noted  . Labor and delivery, indication for care 01/18/2016  . Breast mass, right 10/05/2013  . Pregnant state, incidental 10/05/2013  . Family history of breast cancer 07/01/2013    Past Surgical History:  Procedure Laterality Date  . BONE CYST EXCISION     right foot.    Prior to Admission medications   Medication Sig Start Date End Date Taking? Authorizing Provider  Prenatal Vit-Fe Fumarate-FA (MULTIVITAMIN-PRENATAL) 27-0.8 MG TABS tablet Take 1 tablet by mouth daily at 12 noon.    Historical Provider, MD    Allergies Cefadroxil and Septra [sulfamethoxazole-trimethoprim]  Family History  Problem Relation Age of Onset  . Cancer Mother 63    breast / died at 67  . Hypertension Mother   . Arthritis Father   . Hypertension Sister   . Heart disease Sister     heart murmur  . Birth defects Sister   .  Arthritis Maternal Grandmother   . Cancer Paternal Grandmother     lung  . Emphysema Paternal Grandfather     Social History Social History  Substance Use Topics  . Smoking status: Never Smoker  . Smokeless tobacco: Never Used  . Alcohol use Yes     Comment: social    Review of Systems Patient denies headaches, rhinorrhea, blurry vision, numbness, shortness of breath, chest pain, edema, cough, abdominal pain, nausea, vomiting, diarrhea, dysuria, fevers, rashes or hallucinations unless otherwise stated above in HPI. ____________________________________________   PHYSICAL EXAM:  VITAL SIGNS: Vitals:   06/12/16 1844  BP: 121/81  Pulse: 85  Resp: 18  Temp: 97.9 F (36.6 C)    Constitutional: Alert and oriented. Well appearing and in no acute distress. Eyes: Conjunctivae are normal. PERRL. EOMI. Head: Atraumatic. Nose: No congestion/rhinnorhea. Mouth/Throat: Mucous membranes are moist.  Uvula is midline without edema Neck: No stridor. Painless ROM. No cervical spine tenderness to palpation Hematological/Lymphatic/Immunilogical: No cervical lymphadenopathy. Cardiovascular: Normal rate, regular rhythm. Grossly normal heart sounds.  Good peripheral circulation. Respiratory: Normal respiratory effort.  No retractions. Lungs CTAB. Gastrointestinal: Soft and nontender. No distention. No abdominal bruits. No CVA tenderness. Genitourinary:  Musculoskeletal: No lower extremity tenderness nor edema.  No joint effusions. Neurologic:  Normal speech and language. No gross focal neurologic deficits are appreciated. No gait instability. Skin:  Skin is warm, dry and intact.  No rash noted. Psychiatric: Mood and affect are normal. Speech and behavior are normal.  ____________________________________________   LABS (all labs ordered are listed, but only abnormal results are displayed)  No results found for this or any previous visit (from the past 24  hour(s)). ____________________________________________   ____________________________________________  [id number]   ____________________________________________   PROCEDURES  Procedure(s) performed: none    Critical Care performed: no ____________________________________________   INITIAL IMPRESSION / ASSESSMENT AND PLAN / ED COURSE  Pertinent labs & imaging results that were available during my care of the patient were reviewed by me and considered in my medical decision making (see chart for details).  DDX: Anaphylaxis, anaphylactoid, urticaria  Chelsea Duncan is a 30 y.o. who presents to the ED with acute anaphylactic reaction after being stung by multiple fire ants. Based on swelling and tingling of lips and tongue with diffuse urticaria patient given IM epinephrine with improvement in urticaria. Patient will be monitored for any respiratory depression.  Clinical Course  Comment By Time  Patient reassessed she has no uvular edema. No stridor. Urticaria has improved. She does still have some erythema around the fire ant bites. Otherwise appears stable. Patient has EpiPen's at home and husband is with her use. Patient was requesting discharge home.  Have discussed with the patient and available family all diagnostics and treatments performed thus far and all questions were answered to the best of my ability. The patient demonstrates understanding and agreement with plan.  Merlyn Lot, MD 09/06 2117     ____________________________________________   FINAL CLINICAL IMPRESSION(S) / ED DIAGNOSES  Final diagnoses:  Allergic reaction, initial encounter  Fire ant sting      NEW MEDICATIONS STARTED DURING THIS VISIT:  New Prescriptions   No medications on file     Note:  This document was prepared using Dragon voice recognition software and may include unintentional dictation errors.    Merlyn Lot, MD 06/12/16 [phone]

## 2016-06-12 NOTE — ED Notes (Signed)
Pt states that her rash is decreasing and denies itching at this time - states lips are not tingling and they are not swollen at this time - denies any difficulty breathing or swallowing

## 2016-06-12 NOTE — ED Notes (Addendum)
Pt was checking mail today and then noticed that she had fire ants on her feet biting her - she got the fire ants off and then noticed that her feet were itching and swelling - pt then started to feel her ears itching and ringing - she then started noticing her lips were tingling and starting to swell - pt has a fine red raised bumpy rash all over body - MD notified and order given for epi pen that was given immediately

## 2016-06-12 NOTE — ED Triage Notes (Signed)
Pt arrived via POV for allergic reaction to fire ants - see ed narrator note

## 2016-11-16 ENCOUNTER — Ambulatory Visit
Admission: EM | Admit: 2016-11-16 | Discharge: 2016-11-16 | Disposition: A | Discharge status: Home / Self Care | Payer: BC Managed Care – PPO | Attending: Emergency Medicine | Admitting: Emergency Medicine

## 2016-11-16 DIAGNOSIS — R05 Cough: Secondary | ICD-10-CM | POA: Diagnosis not present

## 2016-11-16 DIAGNOSIS — J029 Acute pharyngitis, unspecified: Secondary | ICD-10-CM | POA: Diagnosis not present

## 2016-11-16 DIAGNOSIS — R059 Cough, unspecified: Secondary | ICD-10-CM

## 2016-11-16 DIAGNOSIS — G44209 Tension-type headache, unspecified, not intractable: Secondary | ICD-10-CM | POA: Diagnosis not present

## 2016-11-16 LAB — RAPID STREP SCREEN (MED CTR MEBANE ONLY): STREPTOCOCCUS, GROUP A SCREEN (DIRECT): NEGATIVE

## 2016-11-16 MED ORDER — METHOCARBAMOL 750 MG PO TABS: 750.0000 mg | ORAL_TABLET | ORAL | 0 refills | Status: DC

## 2016-11-16 MED ORDER — FLUTICASONE PROPIONATE 50 MCG/ACT NA SUSP: 2.0000 | Freq: Every day | NASAL | 0 refills | Status: DC

## 2016-11-16 MED ORDER — IBUPROFEN 600 MG PO TABS: 600.0000 mg | ORAL_TABLET | Freq: Four times a day (QID) | ORAL | 0 refills | Status: DC | PRN

## 2016-11-16 NOTE — Discharge Instructions (Signed)
ibuprofen 600 mg 1 g of Tylenol 3 times a day, Flonase and an antihistamine of your choice such as Claritin, Zyrtec or Allegra and some saline nasal irrigation with a neti pot or Milta Deiters med sinus rinse for the postnasal drip and cough  your rapid strep was negative today, so we have sent off a throat culture.  We will contact you and call in the appropriate antibiotics if your culture comes back positive for an infection requiring antibiotic treatment.  Give [place] a working phone number.  If you were given a prescription for antibiotics, you may want to wait and fill it until you know the results of the culture.  Tylenol and ibuprofen together as needed for pain.  Make sure you drink plenty of extra fluids.  Some people find salt water gargles and  Traditional Medicinal's "Throat Coat" tea helpful. Take 5 mL of liquid Benadryl and 5 mL of Maalox. Mix it together, and then hold it in your mouth for as long as you can and then swallow. You may do this 4 times a day.    Go to www.goodrx.com to look up your medications. This will give you a list of where you can find your prescriptions at the most affordable prices.

## 2016-11-16 NOTE — ED Triage Notes (Addendum)
Pt c/o sore throat, fever, ear ache and chills. Also had a cough for about a month.

## 2016-11-16 NOTE — ED Provider Notes (Signed)
HPI  SUBJECTIVE:  Chelsea Duncan is a 31 y.o. female who presents with alcohol complaints. First, sore throat starting last night.  She states that it is better with yawning, worse with swallowing. She tried Tylenol for this with improvement in her symptoms. Reports bilateral ear pressure and pain, clearish yellow nasal congestion, postnasal drip. No voice changes, drooling, trismus, abdominal pain, rash. No change in hearing, otorrhea, no sinus pain or pressure, dental pain. She states that her cervical lymph nodes feel "swollen". No GERD or allergy type symptoms.She reports feeling feverish but has not documented fevers at home. She also reports a bandlike headache for the past several days that is identical to previous stress headaches that improves with Tylenol. There are no other aggravating or alleviating factors. She reports mild photophobia. She does report increased stress at home this week. No nausea, vomiting, fevers, visual changes, dysarthria, arm or leg weakness, facial droop, discoordination, rash. She states that her neck was sore yesterday and tender to the touch. She also states that her upper back is sore. She has had several contacts with flu and strep. She did not get a flu shot this year. Third, she reports a nonproductive cough for the past month that started after having a cold. No wheezing, chest pain, shortness of breath. She has a past medical history strep throat, GERD, headaches. This is not the first or worst headache of her life. No asthma, emphysema, COPD, smoking, diabetes, hypertension. LMP: Week ago. She denies possibility of being pregnant. PMD: Dr. Glennon Mac at [place].    Past Medical History:  Diagnosis Date  . Allergy   . Complication of anesthesia   . PONV (postoperative nausea and vomiting)     Past Surgical History:  Procedure Laterality Date  . BONE CYST EXCISION     right foot.    Family History  Problem Relation Age of Onset  . Cancer  Mother 75    breast / died at 14  . Hypertension Mother   . Arthritis Father   . Hypertension Sister   . Heart disease Sister     heart murmur  . Birth defects Sister   . Arthritis Maternal Grandmother   . Cancer Paternal Grandmother     lung  . Emphysema Paternal Grandfather     Social History  Substance Use Topics  . Smoking status: Never Smoker  . Smokeless tobacco: Never Used  . Alcohol use Yes     Comment: social    No current facility-administered medications for this encounter.   Current Outpatient Prescriptions:  .  fluticasone (FLONASE) 50 MCG/ACT nasal spray, Place 2 sprays into both nostrils daily., Disp: 16 g, Rfl: 0 .  ibuprofen (ADVIL,MOTRIN) 600 MG tablet, Take 1 tablet (600 mg total) by mouth every 6 (six) hours as needed., Disp: 30 tablet, Rfl: 0 .  methocarbamol (ROBAXIN) 750 MG tablet, Take 1 tablet (750 mg total) by mouth every 4 (four) hours., Disp: 40 tablet, Rfl: 0  Allergies  Allergen Reactions  . Cefadroxil Nausea And Vomiting  . Septra [Sulfamethoxazole-Trimethoprim] Nausea And Vomiting     ROS  As noted in HPI.   Physical Exam  BP 118/79 (BP Location: Left Arm)   Pulse 78   Temp 98.7 F (37.1 C) (Oral)   Resp 18   Ht 5\' 4"  (1.626 m)   Wt 124 lb (56.2 kg)   LMP 10/22/2016   SpO2 100%   BMI 21.28 kg/m   Constitutional: Well developed, well nourished, no  acute distress Eyes: PERRL, EOMI, conjunctiva normal bilaterally, No photophobia HENT: Normocephalic, atraumatic,mucus membranes moist. TMs normal bilaterally. Mild clear nasal congestion no maxillary or frontal sinus tenderness. Erythematous oropharynx, tonsils normal, uvula midline. Positive cobblestoning, postnasal drip, Neck: Positive bilateral trapezial tenderness, muscle spasm. Positive shotty cervical lymphadenopathy, no meningismus Respiratory: Clear to auscultation bilaterally, no rales, no wheezing, no rhonchi Cardiovascular: Normal rate and rhythm, no murmurs, no gallops,  no rubs GI: Soft, nondistended, normal bowel sounds, nontender, no rebound, no guarding no splenomegaly skin: No rash, skin intact Musculoskeletal: No edema, no tenderness, no deformities Neurologic: Alert & oriented x 3, CN II-XII intact, no motor deficits, sensation grossly intact Psychiatric: Speech and behavior appropriate   ED Course   Medications - No data to display  Orders Placed This Encounter  Procedures  . Rapid strep screen    Standing Status:   Standing    Number of Occurrences:   1  . Culture, group A strep    Standing Status:   Standing    Number of Occurrences:   1   Results for orders placed or performed during the hospital encounter of 11/16/16 (from the past 24 hour(s))  Rapid strep screen     Status: None   Collection Time: 11/16/16  8:12 AM  Result Value Ref Range   Streptococcus, Group A Screen (Direct) NEGATIVE NEGATIVE   No results found.  ED Clinical Impression  Sore throat  Tension headache  Cough   ED Assessment/Plan  Presentation most consistent with  1: Tension-type headache. Feel that the neck soreness and stiffness is musculoskeletal given that she has bilateral trapezial tenderness and mild muscle spasm. Doubt meningitis. No evidence of sinusitis She appears nontoxic. Plan to send home with ibuprofen 600 mg 1 g of Tylenol 3 times a day, muscle relaxants Such as Robaxin or Skelaxin. Advised stress reduction, regular exercise. 2: Sore throat: Negative strep. Most likely viral. Does not appear to be mono at this point in time. Throat culture sent. No antibiotics today. Benadryl/Maalox mixture in addition to the Tylenol and ibuprofen. 3: Cough. Most likely from postnasal drip. Lungs clear. Patient has no history of hypertension and is not on any Ace inhibitors. Doubt GERD as this is not bothering her at this point in time. No history of lung disease. We'll send home with Flonase and an antihistamine of her choice such as Claritin, Zyrtec or  Allegra and some saline nasal irrigation.  Follow-up with PMD as needed. To the ER if gets worse.  Discussed labs MDM, plan and followup with patient . Discussed sn/sx that should prompt return to the ED. Patient agrees with plan.   Meds ordered this encounter  Medications  . fluticasone (FLONASE) 50 MCG/ACT nasal spray    Sig: Place 2 sprays into both nostrils daily.    Dispense:  16 g    Refill:  0  . ibuprofen (ADVIL,MOTRIN) 600 MG tablet    Sig: Take 1 tablet (600 mg total) by mouth every 6 (six) hours as needed.    Dispense:  30 tablet    Refill:  0  . methocarbamol (ROBAXIN) 750 MG tablet    Sig: Take 1 tablet (750 mg total) by mouth every 4 (four) hours.    Dispense:  40 tablet    Refill:  0    *This clinic note was created using [employer]. Therefore, there may be occasional mistakes despite careful proofreading.  ?   Melynda Ripple, MD 11/16/16 [phone]

## 2016-11-19 LAB — CULTURE, GROUP A STREP (THRC)

## 2017-05-29 ENCOUNTER — Other Ambulatory Visit: Payer: Self-pay | Admitting: Obstetrics and Gynecology

## 2017-05-29 NOTE — Telephone Encounter (Signed)
Annual scheduled for 9/5 w/SDJ. Please advise

## 2017-06-11 ENCOUNTER — Ambulatory Visit (INDEPENDENT_AMBULATORY_CARE_PROVIDER_SITE_OTHER): Payer: BC Managed Care – PPO | Admitting: Obstetrics and Gynecology

## 2017-06-11 ENCOUNTER — Encounter: Payer: Self-pay | Admitting: Obstetrics and Gynecology

## 2017-06-11 VITALS — BP 122/70 | Ht 64.0 in | Wt 125.0 lb

## 2017-06-11 DIAGNOSIS — Z124 Encounter for screening for malignant neoplasm of cervix: Secondary | ICD-10-CM

## 2017-06-11 DIAGNOSIS — F411 Generalized anxiety disorder: Secondary | ICD-10-CM | POA: Insufficient documentation

## 2017-06-11 DIAGNOSIS — Z01419 Encounter for gynecological examination (general) (routine) without abnormal findings: Secondary | ICD-10-CM

## 2017-06-11 DIAGNOSIS — F430 Acute stress reaction: Secondary | ICD-10-CM

## 2017-06-11 DIAGNOSIS — Z1389 Encounter for screening for other disorder: Secondary | ICD-10-CM

## 2017-06-11 DIAGNOSIS — Z1331 Encounter for screening for depression: Secondary | ICD-10-CM

## 2017-06-11 DIAGNOSIS — Z1339 Encounter for screening examination for other mental health and behavioral disorders: Secondary | ICD-10-CM

## 2017-06-11 MED ORDER — CITALOPRAM HYDROBROMIDE 10 MG PO TABS: 10.0000 mg | ORAL_TABLET | Freq: Every day | ORAL | 3 refills | Status: DC

## 2017-06-11 NOTE — Progress Notes (Signed)
Gynecology Annual Exam   PCP: Will Bonnet, MD  Chief Complaint  Patient presents with  . Annual Exam    History of Present Illness:  [name] is a 31 y.o. G2P1002 who LMP was No LMP recorded. Patient is not currently having periods (Reason: IUD)., presents today for her annual examination.  Her menses are rare.   She is single partner, contraception - IUD.  Last Pap: 04/2015  Results were: ASCUS, HPV neg Hx of STDs: none  There is no FH of breast cancer. There is no FH of ovarian cancer. The patient does not do self-breast exams.  Tobacco use: The patient denies current or previous tobacco use. Alcohol use: social drinker Exercise: moderately active  The patient wears seatbelts: yes.   The patient reports that domestic violence in her life is absent.   Past Medical History:  Diagnosis Date  . Allergy   . Complication of anesthesia   . PONV (postoperative nausea and vomiting)     Past Surgical History:  Procedure Laterality Date  . BONE CYST EXCISION     right foot.    Prior to Admission medications   Medication Sig Start Date End Date Taking? Authorizing Provider  citalopram (CELEXA) 10 MG tablet TAKE 1 TABLET BY MOUTH EVERY DAY 05/29/17  Yes Gae Dry, MD  Levonorgestrel [facility]) 19.5 MG IUD by Intrauterine route.   Yes [provider]  fluticasone (FLONASE) 50 MCG/ACT nasal spray Place 2 sprays into both nostrils daily. Patient not taking: Reported on 06/11/2017 11/16/16   Melynda Ripple, MD  ibuprofen (ADVIL,MOTRIN) 600 MG tablet Take 1 tablet (600 mg total) by mouth every 6 (six) hours as needed. Patient not taking: Reported on 06/11/2017 11/16/16   Melynda Ripple, MD  methocarbamol (ROBAXIN) 750 MG tablet Take 1 tablet (750 mg total) by mouth every 4 (four) hours. Patient not taking: Reported on 06/11/2017 11/16/16   Melynda Ripple, MD    Allergies  Allergen Reactions  . Cefadroxil Nausea And Vomiting  . [employer]   . Septra  [Sulfamethoxazole-Trimethoprim] Nausea And Vomiting    Gynecologic History: No LMP recorded. Patient is not currently having periods (Reason: IUD). History of abnormal pap smear: last pap smear was ASCUS, HPV negative History of STI: No   Obstetric History: G2P1002  Social History   Social History  . Marital status: Single    Spouse name: N/A  . Number of children: N/A  . Years of education: N/A   Occupational History  . Not on file.   Social History Main Topics  . Smoking status: Never Smoker  . Smokeless tobacco: Never Used  . Alcohol use Yes     Comment: social  . Drug use: No  . Sexual activity: Yes    Partners: Male   Other Topics Concern  . Not on file   Social History Narrative  . No narrative on file    Family History  Problem Relation Age of Onset  . Cancer Mother 74       breast / died at 58  . Hypertension Mother   . Arthritis Father   . Hypertension Sister   . Heart disease Sister        heart murmur  . Birth defects Sister   . Arthritis Maternal Grandmother   . Cancer Paternal Grandmother        lung  . Emphysema Paternal Grandfather     Review of Systems  Constitutional: Negative.   HENT:  Negative.   Eyes: Negative.   Respiratory: Negative.   Cardiovascular: Negative.   Gastrointestinal: Negative.   Genitourinary: Negative.   Musculoskeletal: Negative.   Skin: Negative.   Neurological: Negative.   Psychiatric/Behavioral: Negative.      Physical Exam BP 122/70   Ht 5\' 4"  (1.626 m)   Wt 125 lb (56.7 kg)   BMI 21.46 kg/m    Physical Exam  Constitutional: She is oriented to person, place, and time. She appears well-developed and well-nourished. No distress.  Genitourinary: Vagina normal and uterus normal. Pelvic exam was performed with patient supine. There is no rash, tenderness or lesion on the right labia. There is no rash or lesion on the left labia. No erythema or bleeding in the vagina. No signs of injury around the vagina.  Right adnexum does not display mass, does not display tenderness and does not display fullness. Left adnexum does not display mass, does not display tenderness and does not display fullness.  Cervix is parous.  Cervix exhibits visible IUD strings. Cervix does not exhibit motion tenderness, discharge or polyp.   Uterus is mobile and anteverted. Uterus is not enlarged, tender, exhibiting a mass or irregular (is regular).  HENT:  Head: Normocephalic and atraumatic.  Eyes: EOM are normal. No scleral icterus.  Neck: Normal range of motion. Neck supple. No thyromegaly present.  Cardiovascular: Normal rate and regular rhythm.  Exam reveals no gallop and no friction rub.   No murmur heard. Pulmonary/Chest: Effort normal and breath sounds normal. No respiratory distress. She has no wheezes. She has no rales.  Abdominal: Soft. Bowel sounds are normal. She exhibits no distension and no mass. There is no tenderness. There is no rebound and no guarding.  Musculoskeletal: Normal range of motion. She exhibits no edema.  Lymphadenopathy:    She has no cervical adenopathy.  Neurological: She is alert and oriented to person, place, and time. No cranial nerve deficit.  Skin: Skin is warm and dry. No erythema.  Psychiatric: She has a normal mood and affect. Her behavior is normal. Judgment normal.   Female chaperone present for pelvic and breast  portions of the physical exam  Results: AUDIT Questionnaire (screen for alcoholism): 3 PHQ-9: 1  Assessment: 31 y.o. G2P1002 female here for routine annual gynecologic examination  Plan: Problem List Items Addressed This Visit    Anxiety as acute reaction to exceptional stress   Relevant Medications   citalopram (CELEXA) 10 MG tablet    Other Visit Diagnoses    Women's annual routine gynecological examination    -  Primary   Pap smear for cervical cancer screening       Relevant Orders   IGP, rfx Aptima HPV ASCU   Screening for depression       Screening  for alcohol problem         Screening: -- Blood pressure screen normal -- Weight screening: normal -- Depression screening negative (PHQ-9) -- Nutrition: normal -- cholesterol screening: not due for screening -- osteoporosis screening: not due -- tobacco screening: not using -- alcohol screening: AUDIT questionnaire indicates low-risk usage. -- family history of breast cancer screening: done. not at high risk. -- no evidence of domestic violence or intimate partner violence. -- STD screening: gonorrhea/chlamydia NAAT not collected per patient request. -- pap smear collected per ASCCP guidelines -- flu vaccine will get through work -- HPV vaccination series: not eligilbe   Anxiety: decrease and wean off medication, as tolerated.  F/u 1 year or prn.  Prentice Docker, MD 06/11/2017 4:57 PM

## 2017-06-13 ENCOUNTER — Encounter: Payer: Self-pay | Admitting: Obstetrics and Gynecology

## 2017-06-13 LAB — IGP, RFX APTIMA HPV ASCU: PAP SMEAR COMMENT: 0

## 2017-06-16 ENCOUNTER — Encounter: Payer: Self-pay | Admitting: Obstetrics and Gynecology

## 2017-08-18 ENCOUNTER — Telehealth: Payer: Self-pay

## 2017-08-18 DIAGNOSIS — F411 Generalized anxiety disorder: Secondary | ICD-10-CM

## 2017-08-18 DIAGNOSIS — F430 Acute stress reaction: Principal | ICD-10-CM

## 2017-08-18 NOTE — Telephone Encounter (Signed)
Pt would like to talk c SDJ about her medication.  They had talked about her going off of all that.  She doesn't feel she is ready to comm off it right now.  Does she need to make an appt or how does she go about continuing med?  [phone]

## 2017-08-18 NOTE — Telephone Encounter (Signed)
Please advise 

## 2017-08-19 MED ORDER — CITALOPRAM HYDROBROMIDE 10 MG PO TABS: 10.0000 mg | ORAL_TABLET | Freq: Every day | ORAL | 3 refills | Status: DC

## 2017-08-19 NOTE — Telephone Encounter (Signed)
Approve medication for now. Patient does not need follow up unless her symptoms have gotten worse.

## 2017-08-20 NOTE — Telephone Encounter (Signed)
Aware via vm

## 2017-08-28 ENCOUNTER — Other Ambulatory Visit: Payer: Self-pay | Admitting: Obstetrics & Gynecology

## 2017-10-17 ENCOUNTER — Ambulatory Visit: Payer: BC Managed Care – PPO | Admitting: Internal Medicine

## 2017-10-17 ENCOUNTER — Encounter: Payer: Self-pay | Admitting: Internal Medicine

## 2017-10-17 VITALS — BP 104/62 | HR 100 | Temp 99.0°F | Resp 16 | Ht 63.0 in | Wt 125.1 lb

## 2017-10-17 DIAGNOSIS — F430 Acute stress reaction: Secondary | ICD-10-CM

## 2017-10-17 DIAGNOSIS — Z0000 Encounter for general adult medical examination without abnormal findings: Secondary | ICD-10-CM

## 2017-10-17 DIAGNOSIS — Z1159 Encounter for screening for other viral diseases: Secondary | ICD-10-CM

## 2017-10-17 DIAGNOSIS — Z803 Family history of malignant neoplasm of breast: Secondary | ICD-10-CM

## 2017-10-17 DIAGNOSIS — F419 Anxiety disorder, unspecified: Secondary | ICD-10-CM | POA: Diagnosis not present

## 2017-10-17 DIAGNOSIS — Z1329 Encounter for screening for other suspected endocrine disorder: Secondary | ICD-10-CM

## 2017-10-17 DIAGNOSIS — Z23 Encounter for immunization: Secondary | ICD-10-CM

## 2017-10-17 DIAGNOSIS — Z13220 Encounter for screening for lipoid disorders: Secondary | ICD-10-CM

## 2017-10-17 DIAGNOSIS — F411 Generalized anxiety disorder: Secondary | ICD-10-CM

## 2017-10-17 MED ORDER — CITALOPRAM HYDROBROMIDE 10 MG PO TABS: 10.0000 mg | ORAL_TABLET | Freq: Every day | ORAL | 11 refills | Status: DC

## 2017-10-17 NOTE — Patient Instructions (Signed)
Schedule fasting labs w/in next 1-2 weeks  F/u in 6-12 months sooner if needed  Take care    Generalized Anxiety Disorder, Adult Generalized anxiety disorder (GAD) is a mental health disorder. People with this condition constantly worry about everyday events. Unlike normal anxiety, worry related to GAD is not triggered by a specific event. These worries also do not fade or get better with time. GAD interferes with life functions, including relationships, work, and school. GAD can vary from mild to severe. People with severe GAD can have intense waves of anxiety with physical symptoms (panic attacks). What are the causes? The exact cause of GAD is not known. What increases the risk? This condition is more likely to develop in:  Women.  People who have a family history of anxiety disorders.  People who are very shy.  People who experience very stressful life events, such as the death of a loved one.  People who have a very stressful family environment.  What are the signs or symptoms? People with GAD often worry excessively about many things in their lives, such as their health and family. They may also be overly concerned about:  Doing well at work.  Being on time.  Natural disasters.  Friendships.  Physical symptoms of GAD include:  Fatigue.  Muscle tension or having muscle twitches.  Trembling or feeling shaky.  Being easily startled.  Feeling like your heart is pounding or racing.  Feeling out of breath or like you cannot take a deep breath.  Having trouble falling asleep or staying asleep.  Sweating.  Nausea, diarrhea, or irritable bowel syndrome (IBS).  Headaches.  Trouble concentrating or remembering facts.  Restlessness.  Irritability.  How is this diagnosed? Your health care provider can diagnose GAD based on your symptoms and medical history. You will also have a physical exam. The health care provider will ask specific questions about your  symptoms, including how severe they are, when they started, and if they come and go. Your health care provider may ask you about your use of alcohol or drugs, including prescription medicines. Your health care provider may refer you to a mental health specialist for further evaluation. Your health care provider will do a thorough examination and may perform additional tests to rule out other possible causes of your symptoms. To be diagnosed with GAD, a person must have anxiety that:  Is out of his or her control.  Affects several different aspects of his or her life, such as work and relationships.  Causes distress that makes him or her unable to take part in normal activities.  Includes at least three physical symptoms of GAD, such as restlessness, fatigue, trouble concentrating, irritability, muscle tension, or sleep problems.  Before your health care provider can confirm a diagnosis of GAD, these symptoms must be present more days than they are not, and they must last for six months or longer. How is this treated? The following therapies are usually used to treat GAD:  Medicine. Antidepressant medicine is usually prescribed for long-term daily control. Antianxiety medicines may be added in severe cases, especially when panic attacks occur.  Talk therapy (psychotherapy). Certain types of talk therapy can be helpful in treating GAD by providing support, education, and guidance. Options include: ? Cognitive behavioral therapy (CBT). People learn coping skills and techniques to ease their anxiety. They learn to identify unrealistic or negative thoughts and behaviors and to replace them with positive ones. ? Acceptance and commitment therapy (ACT). This treatment teaches people  how to be mindful as a way to cope with unwanted thoughts and feelings. ? Biofeedback. This process trains you to manage your body's response (physiological response) through breathing techniques and relaxation methods. You  will work with a therapist while machines are used to monitor your physical symptoms.  Stress management techniques. These include yoga, meditation, and exercise.  A mental health specialist can help determine which treatment is best for you. Some people see improvement with one type of therapy. However, other people require a combination of therapies. Follow these instructions at home:  Take over-the-counter and prescription medicines only as told by your health care provider.  Try to maintain a normal routine.  Try to anticipate stressful situations and allow extra time to manage them.  Practice any stress management or self-calming techniques as taught by your health care provider.  Do not punish yourself for setbacks or for not making progress.  Try to recognize your accomplishments, even if they are small.  Keep all follow-up visits as told by your health care provider. This is important. Contact a health care provider if:  Your symptoms do not get better.  Your symptoms get worse.  You have signs of depression, such as: ? A persistently sad, cranky, or irritable mood. ? Loss of enjoyment in activities that used to bring you joy. ? Change in weight or eating. ? Changes in sleeping habits. ? Avoiding friends or family members. ? Loss of energy for normal tasks. ? Feelings of guilt or worthlessness. Get help right away if:  You have serious thoughts about hurting yourself or others. If you ever feel like you may hurt yourself or others, or have thoughts about taking your own life, get help right away. You can go to your nearest emergency department or call:  Your local emergency services (911 in the [place]  A suicide crisis helpline, such as the [facility] at 9027214522. This is open 24 hours a day.  Summary  Generalized anxiety disorder (GAD) is a mental health disorder that involves worry that is not triggered by a specific  event.  People with GAD often worry excessively about many things in their lives, such as their health and family.  GAD may cause physical symptoms such as restlessness, trouble concentrating, sleep problems, frequent sweating, nausea, diarrhea, headaches, and trembling or muscle twitching.  A mental health specialist can help determine which treatment is best for you. Some people see improvement with one type of therapy. However, other people require a combination of therapies. This information is not intended to replace advice given to you by your health care provider. Make sure you discuss any questions you have with your health care provider. Document Released: 01/18/2013 Document Revised: 08/13/2016 Document Reviewed: 08/13/2016 Elsevier Interactive Patient Education  [employer].

## 2017-10-19 ENCOUNTER — Encounter: Payer: Self-pay | Admitting: Internal Medicine

## 2017-10-19 NOTE — Progress Notes (Signed)
Chief Complaint  Patient presents with  . Establish Care   Establish care  1. Anxiety w/o depression Celexa 10 mg qd helps she tried to cut if back to 5 mg in the past but 10 mg works better for her she wants refill. PHQ 9 score 11 today denies depression she does report she has a lot of anxiety b/c her dad committed suicide and her mother died young of breast cancer.  Both parents died 7 years ago and then she had a lot of stress with grad school x 2 years plus 2 boys ages 25 and 68.  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   2. She wants flu shot today husband had flu 10/06/17  3. FH breast cancer per pt she had genetic testing though unclear if were BRCA or my myriad    Review of Systems  Constitutional: Negative for weight loss.  HENT: Negative for hearing loss.   Eyes:       No vision problems  Respiratory: Negative for shortness of breath.   Cardiovascular: Negative for chest pain.  Gastrointestinal: Negative for abdominal pain.  Genitourinary:       No urinary sx's   Musculoskeletal: Negative for falls.  Skin: Negative for rash.  Neurological: Negative for headaches.  Psychiatric/Behavioral: Negative for depression. The patient is nervous/anxious.    Past Medical History:  Diagnosis Date  . Allergy   . BRCA negative 03/2016   MyRisk neg except NBN VUS  . Complication of anesthesia   . Family history of breast cancer 03/2016   IBIS=18.4%  . PONV (postoperative nausea and vomiting)    Past Surgical History:  Procedure Laterality Date  . BONE CYST EXCISION     right foot.   Family History  Problem Relation Age of Onset  . Cancer Mother 47       breast / died at 5  . Hypertension Mother   . Depression Mother   . Arthritis Father   . Depression Father        committed suicide   . Hypertension Father   . Hypertension Sister   . Heart disease Sister        heart murmur  . Birth defects Sister   . Arthritis Maternal Grandmother   . Cancer Paternal  Grandmother        lung  . Emphysema Paternal Grandfather    Social History   Socioeconomic History  . Marital status: Single    Spouse name: Not on file  . Number of children: Not on file  . Years of education: Not on file  . Highest education level: Not on file  Social Needs  . Financial resource strain: Not on file  . Food insecurity - worry: Not on file  . Food insecurity - inability: Not on file  . Transportation needs - medical: Not on file  . Transportation needs - non-medical: Not on file  Occupational History  . Not on file  Tobacco Use  . Smoking status: Never Smoker  . Smokeless tobacco: Never Used  Substance and Sexual Activity  . Alcohol use: Yes    Comment: social  . Drug use: No  . Sexual activity: Yes    Partners: Male  Other Topics Concern  . Not on file  Social History Narrative  . Not on file   Current Meds  Medication Sig  . Levonorgestrel (KYLEENA) 19.5 MG IUD by Intrauterine route.   Allergies  Allergen Reactions  . Cefadroxil Nausea  And Vomiting  . Fire [employer]ne[employer]   . Septra [Sulfamethoxazole-Trimethoprim] Nausea And Vomiting   No results found for this or any previous visit (from the past 2160 hour(s)). Objective  Body mass index is 22.16 kg/m. Wt Readings from Last 3 Encounters:  10/17/17 125 lb 2 oz (56.8 kg)  06/11/17 125 lb (56.7 kg)  11/16/16 124 lb (56.2 kg)   Temp Readings from Last 3 Encounters:  10/17/17 99 F (37.2 C) (Oral)  11/16/16 98.7 F (37.1 C) (Oral)  06/12/16 97.9 F (36.6 C) (Oral)   BP Readings from Last 3 Encounters:  10/17/17 104/62  06/11/17 122/70  11/16/16 118/79   Pulse Readings from Last 3 Encounters:  10/17/17 100  11/16/16 78  06/12/16 87   O2 sat room air 98%  Physical Exam  Constitutional: She is oriented to person, place, and time and well-developed, well-nourished, and in no distress. Vital signs are normal.  HENT:  Head: Normocephalic and atraumatic.  Mouth/Throat: Oropharynx is clear and  moist and mucous membranes are normal.  Eyes: Conjunctivae are normal. Pupils are equal, round, and reactive to light.  Cardiovascular: Normal rate, regular rhythm and normal heart sounds.  Pulmonary/Chest: Effort normal and breath sounds normal.  Abdominal: Soft. Bowel sounds are normal. There is no tenderness.  Neurological: She is alert and oriented to person, place, and time. Gait normal. Gait normal.  Skin: Skin is warm, dry and intact.  Psychiatric: Mood, memory, affect and judgment normal.  Nursing note and vitals reviewed.   Assessment   1. Anxiety phq 9 score 11 today though pt denies depression  2. FH breast cancer in mother  3. HM/wellness   Plan  1. Refilled Celexa she has been taking x 1 year and helps cut back to 5 mg at one time but 10 mg helps better  2. Per pt had genetic testing will need to check with OB/GYN to see if had BRCA vs my myriad  3. Given flu shot today  Tdap pt thinks had 2014/2015  Had hpv vaccine age 32 y.o   Check hep B status  Check labs CMET, CBC, lipid, UA, TSH, T4 hep B status upcoming  Declines sTD check   Pap had 06/13/17 neg not hpv tested.  Verdia Kuba IUD due to be replaced 02/22/21      Provider: Dr. Olivia Mackie McLean-Scocuzza-Internal Medicine

## 2017-10-27 ENCOUNTER — Other Ambulatory Visit (INDEPENDENT_AMBULATORY_CARE_PROVIDER_SITE_OTHER): Payer: BC Managed Care – PPO

## 2017-10-27 DIAGNOSIS — Z0000 Encounter for general adult medical examination without abnormal findings: Secondary | ICD-10-CM | POA: Diagnosis not present

## 2017-10-27 DIAGNOSIS — Z1159 Encounter for screening for other viral diseases: Secondary | ICD-10-CM

## 2017-10-27 DIAGNOSIS — Z13220 Encounter for screening for lipoid disorders: Secondary | ICD-10-CM

## 2017-10-27 DIAGNOSIS — F411 Generalized anxiety disorder: Secondary | ICD-10-CM

## 2017-10-27 DIAGNOSIS — Z1329 Encounter for screening for other suspected endocrine disorder: Secondary | ICD-10-CM

## 2017-10-27 DIAGNOSIS — F430 Acute stress reaction: Secondary | ICD-10-CM

## 2017-10-27 LAB — COMPREHENSIVE METABOLIC PANEL
ALBUMIN: 4.4 g/dL (ref 3.5–5.2)
ALT: 14 U/L (ref 0–35)
AST: 16 U/L (ref 0–37)
Alkaline Phosphatase: 50 U/L (ref 39–117)
BUN: 14 mg/dL (ref 6–23)
CALCIUM: 9.4 mg/dL (ref 8.4–10.5)
CHLORIDE: 104 meq/L (ref 96–112)
CO2: 28 mEq/L (ref 19–32)
Creatinine, Ser: 0.78 mg/dL (ref 0.40–1.20)
GFR: 92.25 mL/min (ref >60.00)
Glucose, Bld: 92 mg/dL (ref 70–99)
POTASSIUM: 3.9 meq/L (ref 3.5–5.1)
Sodium: 137 mEq/L (ref 135–145)
Total Bilirubin: 1 mg/dL (ref 0.2–1.2)
Total Protein: 6.9 g/dL (ref 6.0–8.3)

## 2017-10-27 LAB — LIPID PANEL
CHOLESTEROL: 119 mg/dL (ref 0–200)
HDL: 36.9 mg/dL — ABNORMAL LOW (ref >39.00)
LDL Cholesterol: 58 mg/dL (ref 0–99)
NonHDL: 81.69
TRIGLYCERIDES: 120 mg/dL (ref 0.0–149.0)
Total CHOL/HDL Ratio: 3
VLDL: 24 mg/dL (ref 0.0–40.0)

## 2017-10-27 LAB — URINALYSIS, ROUTINE W REFLEX MICROSCOPIC
Bilirubin Urine: NEGATIVE
HGB URINE DIPSTICK: NEGATIVE
Ketones, ur: NEGATIVE
LEUKOCYTES UA: NEGATIVE
NITRITE: NEGATIVE
RBC / HPF: NONE SEEN (ref 0–?)
Specific Gravity, Urine: 1.015 (ref 1.000–1.030)
Total Protein, Urine: NEGATIVE
Urine Glucose: NEGATIVE
Urobilinogen, UA: 0.2 (ref 0.0–1.0)
WBC UA: NONE SEEN (ref 0–?)
pH: 7 (ref 5.0–8.0)

## 2017-10-27 LAB — T4, FREE: Free T4: 0.96 ng/dL (ref 0.60–1.60)

## 2017-10-27 LAB — CBC WITH DIFFERENTIAL/PLATELET
BASOS PCT: 0.9 % (ref 0.0–3.0)
Basophils Absolute: 0 10*3/uL (ref 0.0–0.1)
EOS PCT: 4.2 % (ref 0.0–5.0)
Eosinophils Absolute: 0.2 10*3/uL (ref 0.0–0.7)
HEMATOCRIT: 42 % (ref 36.0–46.0)
HEMOGLOBIN: 14.2 g/dL (ref 12.0–15.0)
Lymphocytes Relative: 40.5 % (ref 12.0–46.0)
Lymphs Abs: 2.1 10*3/uL (ref 0.7–4.0)
MCHC: 33.7 g/dL (ref 30.0–36.0)
MCV: 95.1 fl (ref 78.0–100.0)
MONO ABS: 0.4 10*3/uL (ref 0.1–1.0)
Monocytes Relative: 7.5 % (ref 3.0–12.0)
Neutro Abs: 2.5 10*3/uL (ref 1.4–7.7)
Neutrophils Relative %: 46.9 % (ref 43.0–77.0)
Platelets: 295 10*3/uL (ref 150.0–400.0)
RBC: 4.41 Mil/uL (ref 3.87–5.11)
RDW: 13 % (ref 11.5–15.5)
WBC: 5.2 10*3/uL (ref 4.0–10.5)

## 2017-10-27 LAB — TSH: TSH: 2.21 u[IU]/mL (ref 0.35–4.50)

## 2017-10-28 LAB — HEPATITIS B SURFACE ANTIGEN: HEP B S AG: NONREACTIVE

## 2017-10-28 LAB — HEPATITIS B SURFACE ANTIBODY, QUANTITATIVE: Hepatitis B-Post: 705 m[IU]/mL (ref >=10)

## 2017-10-29 ENCOUNTER — Encounter: Payer: Self-pay | Admitting: *Deleted

## 2017-11-03 ENCOUNTER — Telehealth: Payer: Self-pay

## 2017-11-03 NOTE — Telephone Encounter (Signed)
Copied from [place] [id number]. Topic: Quick Communication - Lab Results >> Nov 03, 2017  1:50 PM Synthia Innocent wrote: Requesting lab results

## 2017-11-04 NOTE — Telephone Encounter (Signed)
Left voice mail to call back ok for P[facility] to speak to patient 

## 2017-11-06 NOTE — Telephone Encounter (Signed)
Patient aware of lab results.

## 2018-02-25 ENCOUNTER — Ambulatory Visit (INDEPENDENT_AMBULATORY_CARE_PROVIDER_SITE_OTHER): Payer: BC Managed Care – PPO | Admitting: Obstetrics and Gynecology

## 2018-02-25 ENCOUNTER — Encounter: Payer: Self-pay | Admitting: Obstetrics and Gynecology

## 2018-02-25 VITALS — BP 108/72 | HR 57 | Wt 125.0 lb

## 2018-02-25 DIAGNOSIS — Z30431 Encounter for routine checking of intrauterine contraceptive device: Secondary | ICD-10-CM

## 2018-02-25 NOTE — Progress Notes (Signed)
Obstetrics & Gynecology Office Visit    Chief Complaint  Patient presents with  . IUD string check    History of Present Illness: 32 y.o. G80P2002 female who wants to make sure her IUD is in place.  Normally her husband can feel the strings.  However, her husband was unable to feel the strings about 2 weeks ago. She normally has spotting monthly. She is spotting right now. She has not seen the device come out.  She has a [place], placed in May of 2017.  It is due to come out in 2022.     Past Medical History:  Diagnosis Date  . Allergy   . Anxiety   . 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   . BRCA negative 03/2016   MyRisk neg except NBN VUS  . Chicken pox   . Complication of anesthesia   . Family history of breast cancer 03/2016   IBIS=18.4%  . PONV (postoperative nausea and vomiting)     Past Surgical History:  Procedure Laterality Date  . BONE CYST EXCISION     right foot.    Gynecologic History: No LMP recorded. (Menstrual status: IUD).  Obstetric History: [id number]  Family History  Problem Relation Age of Onset  . Cancer Mother 83       breast / died at 73  . Hypertension Mother   . Depression Mother   . Arthritis Father   . Depression Father        committed suicide   . Hypertension Father   . Hypertension Sister   . Heart disease Sister        heart murmur  . Birth defects Sister   . Depression Sister   . Arthritis Maternal Grandmother   . Other Maternal Grandmother        elevated tau protein   . Cancer Paternal Grandmother        lung  . Emphysema Paternal Grandfather     Social History   Socioeconomic History  . Marital status: Single    Spouse name: Not on file  . Number of children: Not on file  . Years of education: Not on file  . Highest education level: Not on file  Occupational History  . Not on file  Social Needs  . Financial resource strain: Not on file  . Food insecurity:    Worry: Not on file   Inability: Not on file  . Transportation needs:    Medical: Not on file    Non-medical: Not on file  Tobacco Use  . Smoking status: Never Smoker  . Smokeless tobacco: Never Used  Substance and Sexual Activity  . Alcohol use: Yes    Comment: social  . Drug use: No  . Sexual activity: Yes    Partners: Male  Lifestyle  . Physical activity:    Days per week: Not on file    Minutes per session: Not on file  . Stress: Not on file  Relationships  . Social connections:    Talks on phone: Not on file    Gets together: Not on file    Attends religious service: Not on file    Active member of club or organization: Not on file    Attends meetings of clubs or organizations: Not on file    Relationship status: Not on file  . Intimate partner violence:    Fear of current or ex partner: Not on file  Emotionally abused: Not on file    Physically abused: Not on file    Forced sexual activity: Not on file  Other Topics Concern  . 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 Cefadroxil Nausea And Vomiting  . Fire [employer]     anaphylaxis  . Septra [Sulfamethoxazole-Trimethoprim] Nausea And Vomiting    Prior to Admission medications   Medication Sig Start Date End Date Taking? Authorizing Provider  EPINEPHrine (EPIPEN 2-PAK) 0.3 mg/0.3 mL IJ SOAJ injection Inject into the muscle once.   Yes [provider]  Levonorgestrel (KYLEENA) 19.5 MG IUD by Intrauterine route.   Yes [provider]  citalopram (CELEXA) 10 MG tablet Take 1 tablet (10 mg total) by mouth daily. 10/17/17 11/16/17  McLean-Scocuzza, Nino Glow, MD    Review of Systems  Constitutional: Negative.   HENT: Negative.   Eyes: Negative.   Respiratory: Negative.   Cardiovascular: Negative.   Gastrointestinal: Negative.   Genitourinary: Negative.   Musculoskeletal: Negative.   Skin: Negative.   Neurological: Negative.     Psychiatric/Behavioral: Negative.      Physical Exam BP 108/72 (BP Location: Left Arm, Patient Position: Sitting, Cuff Size: Normal)   Pulse (!) 57   Wt 125 lb (56.7 kg)   BMI 22.14 kg/m  No LMP recorded. (Menstrual status: IUD). Physical Exam  Constitutional: She is oriented to person, place, and time. She appears well-developed and well-nourished. No distress.  Genitourinary: Vagina normal. Pelvic exam was performed with patient supine. There is no rash, tenderness or lesion on the right labia. There is no rash, tenderness or lesion on the left labia.  Cervix exhibits visible IUD strings. Cervix does not exhibit motion tenderness, lesion or polyp.  HENT:  Head: Normocephalic and atraumatic.  Abdominal: Soft. She exhibits no distension. There is no tenderness. There is no rebound and no guarding.  Neurological: She is alert and oriented to person, place, and time. No cranial nerve deficit.  Psychiatric: She has a normal mood and affect. Her behavior is normal. Judgment normal.   Female chaperone present for pelvic and breast  portions of the physical exam  Assessment: 32 y.o. G36P1002 female here for  1. IUD check up      Plan: Problem List Items Addressed This Visit    None    Visit Diagnoses    IUD check up    -  Primary     Patient reassured as IUD strings visualized.  Patient had IUD placed in 2017. Due to be removed in 2022. Patient encouraged to follow up for annual breast exam in September, given her family history.   I printed her Myriad results from her genetic testing from our old EMR and will have them scanned into Epic so that her PCP can see the results and which genes were tested, specifically.   Prentice Docker, MD 02/25/2018 8:53 AM

## 2018-03-26 ENCOUNTER — Telehealth: Payer: Self-pay

## 2018-03-26 NOTE — Telephone Encounter (Signed)
Pt calling to see if SDJ did a reg pap at last appt or just ck'd IUD.  [phone]  Adv pap was done 06/21/17.  SJ only ck'd IUD.

## 2018-06-16 ENCOUNTER — Encounter: Payer: Self-pay | Admitting: Obstetrics and Gynecology

## 2018-06-16 ENCOUNTER — Ambulatory Visit (INDEPENDENT_AMBULATORY_CARE_PROVIDER_SITE_OTHER): Payer: BC Managed Care – PPO | Admitting: Obstetrics and Gynecology

## 2018-06-16 VITALS — BP 118/74 | Ht 64.0 in | Wt 126.0 lb

## 2018-06-16 DIAGNOSIS — Z1331 Encounter for screening for depression: Secondary | ICD-10-CM | POA: Diagnosis not present

## 2018-06-16 DIAGNOSIS — Z01419 Encounter for gynecological examination (general) (routine) without abnormal findings: Secondary | ICD-10-CM

## 2018-06-16 DIAGNOSIS — Z803 Family history of malignant neoplasm of breast: Secondary | ICD-10-CM | POA: Diagnosis not present

## 2018-06-16 DIAGNOSIS — Z1339 Encounter for screening examination for other mental health and behavioral disorders: Secondary | ICD-10-CM

## 2018-06-16 NOTE — Progress Notes (Signed)
Gynecology Annual Exam  PCP: McLean-Scocuzza, Nino Glow, MD  Chief Complaint  Patient presents with  . Annual Exam   History of Present Illness:  Ms. Tyreisha Ungar is a 32 y.o. G2P1002 who LMP was Patient's last menstrual period was 05/26/2018., presents today for her annual examination.  Her menses are light (spotting), occurring monthly or less.   She is single partner, contraception - IUD.  Last Pap: 06/11/2017  Results were: no abnormalities  Hx of STDs: none  Last mammogram: n/a  There is a FH of breast cancer.  She has tested negative for BRCA mutations (Myriad). There is no FH of ovarian cancer. The patient does do self-breast exams.  Tobacco use: The patient denies current or previous tobacco use. Alcohol use: social drinker Exercise: not active  The patient wears seatbelts: yes.      Past Medical History:  Diagnosis Date  . Allergy   . Anxiety   . 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   . BRCA negative 03/2016   MyRisk neg except NBN VUS  . Chicken pox   . Complication of anesthesia   . Family history of breast cancer 03/2016   IBIS=18.4%  . PONV (postoperative nausea and vomiting)     Past Surgical History:  Procedure Laterality Date  . BONE CYST EXCISION     right foot.    Prior to Admission medications   Medication Sig Start Date End Date Taking? Authorizing Provider  Levonorgestrel (KYLEENA) 19.5 MG IUD by Intrauterine route.   Yes [provider]  citalopram (CELEXA) 10 MG tablet Take 1 tablet (10 mg total) by mouth daily. 10/17/17 11/16/17  McLean-Scocuzza, Nino Glow, MD  EPINEPHrine (EPIPEN 2-PAK) 0.3 mg/0.3 mL IJ SOAJ injection Inject into the muscle once.    [provider]    Allergies  Allergen Reactions  . Cefadroxil Nausea And Vomiting  . Fire [employer]ne[employer]     anaphylaxis  . Septra [Sulfamethoxazole-Trimethoprim] Nausea And Vomiting    Gynecologic History: Patient's last menstrual period was  05/26/2018.  Obstetric History: G2P1002  Social History   Socioeconomic History  . Marital status: Single    Spouse name: Not on file  . Number of children: Not on file  . Years of education: Not on file  . Highest education level: Not on file  Occupational History  . Not on file  Social Needs  . Financial resource strain: Not on file  . Food insecurity:    Worry: Not on file    Inability: Not on file  . Transportation needs:    Medical: Not on file    Non-medical: Not on file  Tobacco Use  . Smoking status: Never Smoker  . Smokeless tobacco: Never Used  Substance and Sexual Activity  . Alcohol use: Yes    Comment: social  . Drug use: No  . Sexual activity: Yes    Partners: Male    Birth control/protection: IUD  Lifestyle  . Physical activity:    Days per week: Not on file    Minutes per session: Not on file  . Stress: Not on file  Relationships  . Social connections:    Talks on phone: Not on file    Gets together: Not on file    Attends religious service: Not on file    Active member of club or organization: Not on file    Attends meetings of clubs or organizations: Not on file  Relationship status: Not on file  . Intimate partner violence:    Fear of current or ex partner: Not on file    Emotionally abused: Not on file    Physically abused: Not on file    Forced sexual activity: Not on file  Other Topics Concern  . 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 Cancer Mother 81       breast / died at 14  . Hypertension Mother   . Depression Mother   . Arthritis Father   . Depression Father        committed suicide   . Hypertension Father   . Hypertension Sister   . Heart disease Sister        heart murmur  . Birth defects Sister   . Depression Sister   . Arthritis Maternal Grandmother   . Other Maternal Grandmother        elevated tau protein    . Cancer Paternal Grandmother        lung  . Emphysema Paternal Grandfather     Review of Systems  Constitutional: Negative.   HENT: Negative.   Eyes: Negative.   Respiratory: Negative.   Cardiovascular: Negative.   Gastrointestinal: Negative.   Genitourinary: Negative.   Musculoskeletal: Negative.   Skin: Negative.   Neurological: Negative.   Psychiatric/Behavioral: Negative.      Physical Exam BP 118/74   Ht '5\' 4"'  (1.626 m)   Wt 126 lb (57.2 kg)   LMP 05/26/2018   BMI 21.63 kg/m    Physical Exam  Constitutional: She is oriented to person, place, and time. She appears well-developed and well-nourished. No distress.  HENT:  Head: Normocephalic and atraumatic.  Eyes: EOM are normal. No scleral icterus.  Neck: Normal range of motion. Neck supple.  Cardiovascular: Normal rate and regular rhythm. Exam reveals no gallop and no friction rub.  No murmur heard. Pulmonary/Chest: Effort normal and breath sounds normal. No respiratory distress. She has no wheezes. She has no rales. Right breast exhibits no inverted nipple, no mass, no nipple discharge, no skin change and no tenderness. Left breast exhibits no inverted nipple, no mass, no nipple discharge, no skin change and no tenderness.  Abdominal: Soft. Bowel sounds are normal. She exhibits no distension and no mass. There is no tenderness. There is no rebound and no guarding.  Musculoskeletal: Normal range of motion. She exhibits no edema.  Neurological: She is alert and oriented to person, place, and time. No cranial nerve deficit.  Skin: Skin is warm and dry. No erythema.  Psychiatric: She has a normal mood and affect. Her behavior is normal. Judgment normal.   Female chaperone present for pelvic and breast  portions of the physical exam  Results: AUDIT Questionnaire (screen for alcoholism): 3 PHQ-9: 0  Assessment: 32 y.o. G56P1002 female here for routine annual gynecologic examination.  Plan: Problem List Items  Addressed This Visit      Other   Family history of breast cancer    Other Visit Diagnoses    Women's annual routine gynecological examination    -  Primary   Screening for depression       Screening for alcoholism          Screening: -- Blood pressure screen normal -- Colonoscopy - not due -- Mammogram - not due -- Weight screening: normal --  Depression screening: treatment per PCP. PHQ9 score today 0.  -- Nutrition: normal -- cholesterol screening: not due for screening -- osteoporosis screening: not due -- tobacco screening: not using -- alcohol screening: AUDIT questionnaire indicates low-risk usage. -- family history of breast cancer screening: done. not at high risk. -- no evidence of domestic violence or intimate partner violence. -- STD screening: gonorrhea/chlamydia NAAT not collected per patient request. -- pap smear not collected per ASCCP guidelines -- flu vaccine plans to get this year -- HPV vaccination series: received   Family history of breast cancer: no concerning findings on breast exam today. Continue with every 6 month breast exams.   Prentice Docker, MD 06/18/2018 10:46 AM

## 2018-06-18 ENCOUNTER — Encounter: Payer: Self-pay | Admitting: Obstetrics and Gynecology

## 2018-08-14 ENCOUNTER — Encounter: Payer: Self-pay | Admitting: Internal Medicine

## 2018-08-14 ENCOUNTER — Ambulatory Visit: Payer: BC Managed Care – PPO | Admitting: Internal Medicine

## 2018-08-14 VITALS — BP 122/80 | HR 79 | Temp 98.1°F | Resp 16 | Ht 64.0 in | Wt 127.4 lb

## 2018-08-14 DIAGNOSIS — Z13220 Encounter for screening for lipoid disorders: Secondary | ICD-10-CM

## 2018-08-14 DIAGNOSIS — E559 Vitamin D deficiency, unspecified: Secondary | ICD-10-CM

## 2018-08-14 DIAGNOSIS — Z1389 Encounter for screening for other disorder: Secondary | ICD-10-CM

## 2018-08-14 DIAGNOSIS — Z1329 Encounter for screening for other suspected endocrine disorder: Secondary | ICD-10-CM | POA: Diagnosis not present

## 2018-08-14 DIAGNOSIS — Z1159 Encounter for screening for other viral diseases: Secondary | ICD-10-CM

## 2018-08-14 DIAGNOSIS — Z0000 Encounter for general adult medical examination without abnormal findings: Secondary | ICD-10-CM

## 2018-08-14 DIAGNOSIS — F411 Generalized anxiety disorder: Secondary | ICD-10-CM

## 2018-08-14 DIAGNOSIS — R5383 Other fatigue: Secondary | ICD-10-CM | POA: Insufficient documentation

## 2018-08-14 DIAGNOSIS — F430 Acute stress reaction: Secondary | ICD-10-CM

## 2018-08-14 DIAGNOSIS — Z0184 Encounter for antibody response examination: Secondary | ICD-10-CM

## 2018-08-14 MED ORDER — CITALOPRAM HYDROBROMIDE 10 MG PO TABS: 5.0000 mg | ORAL_TABLET | Freq: Every day | ORAL | 2 refills | Status: DC

## 2018-08-14 NOTE — Progress Notes (Signed)
Chief Complaint  Patient presents with  . Follow-up   F/u  1. Anxiety>depression celexa 10 mg caused fatigue/tired and pt noticed when she was off medication she was taking medication at night and still had fatigue she has been on lower dose of 5 mg in the past and wants to reduce anxiety is more situational with work related issues    Review of Systems  Constitutional: Negative for weight loss.  HENT: Negative for hearing loss.   Eyes: Negative for blurred vision.  Respiratory: Negative for shortness of breath.   Cardiovascular: Negative for chest pain.  Gastrointestinal: Negative for abdominal pain.  Musculoskeletal: Negative for falls.  Skin: Negative for rash.  Neurological: Negative for headaches.  Psychiatric/Behavioral: The patient is nervous/anxious.    Past Medical History:  Diagnosis Date  . Allergy   . Anxiety   . 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   . BRCA negative 03/2016   MyRisk neg except NBN VUS  . Chicken pox   . Complication of anesthesia   . Family history of breast cancer 03/2016   IBIS=18.4%  . PONV (postoperative nausea and vomiting)    Past Surgical History:  Procedure Laterality Date  . BONE CYST EXCISION     right foot.   Family History  Problem Relation Age of Onset  . Cancer Mother 55       breast / died at 28  . Hypertension Mother   . Depression Mother   . Arthritis Father   . Depression Father        committed suicide   . Hypertension Father   . Hypertension Sister   . Heart disease Sister        heart murmur  . Birth defects Sister   . Depression Sister   . Arthritis Maternal Grandmother   . Other Maternal Grandmother        elevated tau protein   . Cancer Paternal Grandmother        lung  . Emphysema Paternal Grandfather    Social History   Socioeconomic History  . Marital status: Single    Spouse name: Not on file  . Number of children: Not on file  . Years of education: Not on file  .  Highest education level: Not on file  Occupational History  . Not on file  Social Needs  . Financial resource strain: Not on file  . Food insecurity:    Worry: Not on file    Inability: Not on file  . Transportation needs:    Medical: Not on file    Non-medical: Not on file  Tobacco Use  . Smoking status: Never Smoker  . Smokeless tobacco: Never Used  Substance and Sexual Activity  . Alcohol use: Yes    Comment: social  . Drug use: No  . Sexual activity: Yes    Partners: Male    Birth control/protection: IUD  Lifestyle  . Physical activity:    Days per week: Not on file    Minutes per session: Not on file  . Stress: Not on file  Relationships  . Social connections:    Talks on phone: Not on file    Gets together: Not on file    Attends religious service: Not on file    Active member of club or organization: Not on file    Attends meetings of clubs or organizations: Not on file    Relationship status: Not on  file  . Intimate partner violence:    Fear of current or ex partner: Not on file    Emotionally abused: Not on file    Physically abused: Not on file    Forced sexual activity: Not on file  Other Topics Concern  . 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 EPINEPHrine (EPIPEN 2-PAK) 0.3 mg/0.3 mL IJ SOAJ injection Inject into the muscle once.  . Levonorgestrel (KYLEENA) 19.5 MG IUD by Intrauterine route.   Allergies  Allergen Reactions  . Cefadroxil Nausea And Vomiting  . Fire [employer]     anaphylaxis  . Septra [Sulfamethoxazole-Trimethoprim] Nausea And Vomiting   No results found for this or any previous visit (from the past 2160 hour(s)). Objective  Body mass index is 21.86 kg/m. Wt Readings from Last 3 Encounters:  08/14/18 127 lb 6 oz (57.8 kg)  06/16/18 126 lb (57.2 kg)  02/25/18 125 lb (56.7 kg)   Temp Readings from Last 3 Encounters:  08/14/18 98.1 F (36.7 C)  (Oral)  10/17/17 99 F (37.2 C) (Oral)  11/16/16 98.7 F (37.1 C) (Oral)   BP Readings from Last 3 Encounters:  08/14/18 122/80  06/16/18 118/74  02/25/18 108/72   Pulse Readings from Last 3 Encounters:  08/14/18 79  02/25/18 (!) 57  10/17/17 100    Physical Exam  Constitutional: She is oriented to person, place, and time. Vital signs are normal. She appears well-developed and well-nourished. She is cooperative.  HENT:  Head: Normocephalic and atraumatic.  Mouth/Throat: Oropharynx is clear and moist and mucous membranes are normal.  Eyes: Pupils are equal, round, and reactive to light. Conjunctivae are normal.  Cardiovascular: Normal rate, regular rhythm and normal heart sounds.  Pulmonary/Chest: Effort normal and breath sounds normal.  Neurological: She is alert and oriented to person, place, and time. Gait normal.  Skin: Skin is warm, dry and intact.  Psychiatric: She has a normal mood and affect. Her speech is normal and behavior is normal. Judgment and thought content normal. Cognition and memory are normal.  Nursing note and vitals reviewed.   Assessment   1. Anxiety>depression  2. HM Plan   1.  Cut celexa to 5 mg qhs consider change to buspar, ativan/xanax or effexor to research My chart in 2 weeks  2.  Flu shot had 07/2018 work  Tdap pt thinks had 2014/2015  Had hpv vaccine age 56 y.o   Hep B immune  sch fasting labs 10/27/2018 Declines sTD check   Pap had 06/13/17 neg not hpv tested.  Verdia Kuba IUD due to be replaced 02/22/21      Provider: Dr. Olivia Mackie McLean-Scocuzza-Internal Medicine

## 2018-08-14 NOTE — Patient Instructions (Addendum)
Buspar-anxiety  Ativan, Xanax anxiety only  Effexor -mood and anxiety   Keratosis pilaris amlactin lotion, exfoliate gently, moisturize with cetaphil or cerave cream   My chart in 2 weeks   Venlafaxine tablets What is this medicine? VENLAFAXINE (VEN la fax een) is used to treat depression, anxiety and panic disorder. This medicine may be used for other purposes; ask your health care provider or pharmacist if you have questions. COMMON BRAND NAME(S): Effexor What should I tell my health care provider before I take this medicine? They need to know if you have any of these conditions: -bleeding disorders -glaucoma -heart disease -high blood pressure -high cholesterol -kidney disease -liver disease -low levels of sodium in the blood -mania or bipolar disorder -seizures -suicidal thoughts, plans, or attempt; a previous suicide attempt by you or a family -take medicines that treat or prevent blood clots -thyroid disease -an unusual or allergic reaction to venlafaxine, desvenlafaxine, other medicines, foods, dyes, or preservatives -pregnant or trying to get pregnant -breast-feeding How should I use this medicine? Take this medicine by mouth with a glass of water. Follow the directions on the prescription label. Take it with food. Take your medicine at regular intervals. Do not take your medicine more often than directed. Do not stop taking this medicine suddenly except upon the advice of your doctor. Stopping this medicine too quickly may cause serious side effects or your condition may worsen. A special MedGuide will be given to you by the pharmacist with each prescription and refill. Be sure to read this information carefully each time. Talk to your pediatrician regarding the use of this medicine in children. Special care may be needed. Overdosage: If you think you have taken too much of this medicine contact a poison control center or emergency room at once. NOTE: This medicine is  only for you. Do not share this medicine with others. What if I miss a dose? If you miss a dose, take it as soon as you can. If it is almost time for your next dose, take only that dose. Do not take double or extra doses. What may interact with this medicine? Do not take this medicine with any of the following medications: -certain medicines for fungal infections like fluconazole, itraconazole, ketoconazole, posaconazole, voriconazole -cisapride -desvenlafaxine -dofetilide -dronedarone -duloxetine -levomilnacipran -linezolid -MAOIs like Carbex, Eldepryl, Marplan, Nardil, and Parnate -methylene blue (injected into a vein) -milnacipran -pimozide -thioridazine -ziprasidone This medicine may also interact with the following medications: -amphetamines -aspirin and aspirin-like medicines -certain medicines for depression, anxiety, or psychotic disturbances -certain medicines for migraine headaches like almotriptan, eletriptan, frovatriptan, naratriptan, rizatriptan, sumatriptan, zolmitriptan -certain medicines for sleep -certain medicines that treat or prevent blood clots like dalteparin, enoxaparin, warfarin -cimetidine -clozapine -diuretics -fentanyl -furazolidone -indinavir -isoniazid -lithium -metoprolol -NSAIDS, medicines for pain and inflammation, like ibuprofen or naproxen -other medicines that prolong the QT interval (cause an abnormal heart rhythm) -procarbazine -rasagiline -supplements like St. John's wort, kava kava, valerian -tramadol -tryptophan This list may not describe all possible interactions. Give your health care provider a list of all the medicines, herbs, non-prescription drugs, or dietary supplements you use. Also tell them if you smoke, drink alcohol, or use illegal drugs. Some items may interact with your medicine. What should I watch for while using this medicine? Tell your doctor if your symptoms do not get better or if they get worse. Visit your  doctor or health care professional for regular checks on your progress. Because it may take several weeks to see the  full effects of this medicine, it is important to continue your treatment as prescribed by your doctor. Patients and their families should watch out for new or worsening thoughts of suicide or depression. Also watch out for sudden changes in feelings such as feeling anxious, agitated, panicky, irritable, hostile, aggressive, impulsive, severely restless, overly excited and hyperactive, or not being able to sleep. If this happens, especially at the beginning of treatment or after a change in dose, call your health care professional. This medicine can cause an increase in blood pressure. Check with your doctor for instructions on monitoring your blood pressure while taking this medicine. You may get drowsy or dizzy. Do not drive, use machinery, or do anything that needs mental alertness until you know how this medicine affects you. Do not stand or sit up quickly, especially if you are an older patient. This reduces the risk of dizzy or fainting spells. Alcohol may interfere with the effect of this medicine. Avoid alcoholic drinks. Your mouth may get dry. Chewing sugarless gum, sucking hard candy and drinking plenty of water will help. Contact your doctor if the problem does not go away or is severe. What side effects may I notice from receiving this medicine? Side effects that you should report to your doctor or health care professional as soon as possible: -allergic reactions like skin rash, itching or hives, swelling of the face, lips, or tongue -anxious -breathing problems -confusion -changes in vision -chest pain -confusion -elevated mood, decreased need for sleep, racing thoughts, impulsive behavior -eye pain -fast, irregular heartbeat -feeling faint or lightheaded, falls -feeling agitated, angry, or irritable -hallucination, loss of contact with reality -high blood  pressure -loss of balance or coordination -palpitations -redness, blistering, peeling or loosening of the skin, including inside the mouth -restlessness, pacing, inability to keep still -seizures -stiff muscles -suicidal thoughts or other mood changes -trouble passing urine or change in the amount of urine -trouble sleeping -unusual bleeding or bruising -unusually weak or tired -vomiting Side effects that usually do not require medical attention (report to your doctor or health care professional if they continue or are bothersome): -change in sex drive or performance -change in appetite or weight -constipation -dizziness -dry mouth -headache -increased sweating -nausea -tired This list may not describe all possible side effects. Call your doctor for medical advice about side effects. You may report side effects to FDA at 1-800-FDA-1088. Where should I keep my medicine? Keep out of the reach of children. Store at a controlled temperature between 20 and 25 degrees C (68 and 77 degrees F), in a dry place. Throw away any unused medicine after the expiration date. NOTE: This sheet is a summary. It may not cover all possible information. If you have questions about this medicine, talk to your doctor, pharmacist, or health care provider.  2018 [employer]/Gold Standard (2016-02-22 18:42:26)  Alprazolam tablets What is this medicine? ALPRAZOLAM (al PRAY zoe lam) is a benzodiazepine. It is used to treat anxiety and panic attacks. This medicine may be used for other purposes; ask your health care provider or pharmacist if you have questions. COMMON BRAND NAME(S): Xanax What should I tell my health care provider before I take this medicine? They need to know if you have any of these conditions: -an alcohol or drug abuse problem -bipolar disorder, depression, psychosis or other mental health conditions -glaucoma -kidney or liver disease -lung or breathing disease -myasthenia  gravis -Parkinson's disease -porphyria -seizures or a history of seizures -suicidal thoughts -an unusual or allergic  reaction to alprazolam, other benzodiazepines, foods, dyes, or preservatives -pregnant or trying to get pregnant -breast-feeding How should I use this medicine? Take this medicine by mouth with a glass of water. Follow the directions on the prescription label. Take your medicine at regular intervals. Do not take it more often than directed. Do not stop taking except on your doctor's advice. A special MedGuide will be given to you by the pharmacist with each prescription and refill. Be sure to read this information carefully each time. Talk to your pediatrician regarding the use of this medicine in children. Special care may be needed. Overdosage: If you think you have taken too much of this medicine contact a poison control center or emergency room at once. NOTE: This medicine is only for you. Do not share this medicine with others. What if I miss a dose? If you miss a dose, take it as soon as you can. If it is almost time for your next dose, take only that dose. Do not take double or extra doses. What may interact with this medicine? Do not take this medicine with any of the following medications: -certain antiviral medicines for HIV or AIDS like delavirdine, indinavir -certain medicines for fungal infections like ketoconazole and itraconazole -narcotic medicines for cough -sodium oxybate This medicine may also interact with the following medications: -alcohol -antihistamines for allergy, cough and cold -certain antibiotics like clarithromycin, erythromycin, isoniazid, rifampin, rifapentine, rifabutin, and troleandomycin -certain medicines for blood pressure, heart disease, irregular heart beat -certain medicines for depression, like amitriptyline, fluoxetine, sertraline -certain medicines for seizures like carbamazepine, oxcarbazepine, phenobarbital, phenytoin,  primidone -cimetidine -cyclosporine -female hormones, like estrogens or progestins and birth control pills, patches, rings, or injections -general anesthetics like halothane, isoflurane, methoxyflurane, propofol -grapefruit juice -local anesthetics like lidocaine, pramoxine, tetracaine -medicines that relax muscles for surgery -narcotic medicines for pain -other antiviral medicines for HIV or AIDS -phenothiazines like chlorpromazine, mesoridazine, prochlorperazine, thioridazine This list may not describe all possible interactions. Give your health care provider a list of all the medicines, herbs, non-prescription drugs, or dietary supplements you use. Also tell them if you smoke, drink alcohol, or use illegal drugs. Some items may interact with your medicine. What should I watch for while using this medicine? Tell your doctor or health care professional if your symptoms do not start to get better or if they get worse. Do not stop taking except on your doctor's advice. You may develop a severe reaction. Your doctor will tell you how much medicine to take. You may get drowsy or dizzy. Do not drive, use machinery, or do anything that needs mental alertness until you know how this medicine affects you. To reduce the risk of dizzy and fainting spells, do not stand or sit up quickly, especially if you are an older patient. Alcohol may increase dizziness and drowsiness. Avoid alcoholic drinks. If you are taking another medicine that also causes drowsiness, you may have more side effects. Give your health care provider a list of all medicines you use. Your doctor will tell you how much medicine to take. Do not take more medicine than directed. Call emergency for help if you have problems breathing or unusual sleepiness. What side effects may I notice from receiving this medicine? Side effects that you should report to your doctor or health care professional as soon as possible: -allergic reactions like  skin rash, itching or hives, swelling of the face, lips, or tongue -breathing problems -confusion -loss of balance or coordination -signs and symptoms  of low blood pressure like dizziness; feeling faint or lightheaded, falls; unusually weak or tired -suicidal thoughts or other mood changes Side effects that usually do not require medical attention (report to your doctor or health care professional if they continue or are bothersome): -dizziness -dry mouth -nausea, vomiting -tiredness This list may not describe all possible side effects. Call your doctor for medical advice about side effects. You may report side effects to FDA at 1-800-FDA-1088. Where should I keep my medicine? Keep out of the reach of children. This medicine can be abused. Keep your medicine in a safe place to protect it from theft. Do not share this medicine with anyone. Selling or giving away this medicine is dangerous and against the law. Store at room temperature between 20 and 25 degrees C (68 and 77 degrees F). This medicine may cause accidental overdose and death if taken by other adults, children, or pets. Mix any unused medicine with a substance like cat litter or coffee grounds. Then throw the medicine away in a sealed container like a sealed bag or a coffee can with a lid. Do not use the medicine after the expiration date. NOTE: This sheet is a summary. It may not cover all possible information. If you have questions about this medicine, talk to your doctor, pharmacist, or health care provider.  2018 [employer]/Gold Standard (2015-06-22 13:47:25)  Lorazepam tablets What is this medicine? LORAZEPAM (lor A ze pam) is a benzodiazepine. It is used to treat anxiety. This medicine may be used for other purposes; ask your health care provider or pharmacist if you have questions. COMMON BRAND NAME(S): Ativan What should I tell my health care provider before I take this medicine? They need to know if you have any of these  conditions: -glaucoma -history of drug or alcohol abuse problem -kidney disease -liver disease -lung or breathing disease, like asthma -mental illness -myasthenia gravis -Parkinson's disease -suicidal thoughts, plans, or attempt; a previous suicide attempt by you or a family member -an unusual or allergic reaction to lorazepam, other medicines, foods, dyes, or preservatives -pregnant or trying to get pregnant -breast-feeding How should I use this medicine? Take this medicine by mouth with a glass of water. Follow the directions on the prescription label. Take your medicine at regular intervals. Do not take it more often than directed. Do not stop taking except on your doctor's advice. A special MedGuide will be given to you by the pharmacist with each prescription and refill. Be sure to read this information carefully each time. Talk to your pediatrician regarding the use of this medicine in children. While this drug may be used in children as young as 12 years for selected conditions, precautions do apply. Overdosage: If you think you have taken too much of this medicine contact a poison control center or emergency room at once. NOTE: This medicine is only for you. Do not share this medicine with others. What if I miss a dose? If you miss a dose, take it as soon as you can. If it is almost time for your next dose, take only that dose. Do not take double or extra doses. What may interact with this medicine? Do not take this medicine with any of the following medications: -narcotic medicines for cough -sodium oxybate This medicine may also interact with the following medications: -alcohol -antihistamines for allergy, cough and cold -certain medicines for anxiety or sleep -certain medicines for depression, like amitriptyline, fluoxetine, sertraline -certain medicines for seizures like carbamazepine, phenobarbital, phenytoin, primidone -  general anesthetics like lidocaine, pramoxine,  tetracaine -MAOIs like Carbex, Eldepryl, Marplan, Nardil, and Parnate -medicines that relax muscles for surgery -narcotic medicines for pain -phenothiazines like chlorpromazine, mesoridazine, prochlorperazine, thioridazine This list may not describe all possible interactions. Give your health care provider a list of all the medicines, herbs, non-prescription drugs, or dietary supplements you use. Also tell them if you smoke, drink alcohol, or use illegal drugs. Some items may interact with your medicine. What should I watch for while using this medicine? Tell your doctor or health care professional if your symptoms do not start to get better or if they get worse. Do not stop taking except on your doctor's advice. You may develop a severe reaction. Your doctor will tell you how much medicine to take. You may get drowsy or dizzy. Do not drive, use machinery, or do anything that needs mental alertness until you know how this medicine affects you. To reduce the risk of dizzy and fainting spells, do not stand or sit up quickly, especially if you are an older patient. Alcohol may increase dizziness and drowsiness. Avoid alcoholic drinks. If you are taking another medicine that also causes drowsiness, you may have more side effects. Give your health care provider a list of all medicines you use. Your doctor will tell you how much medicine to take. Do not take more medicine than directed. Call emergency for help if you have problems breathing or unusual sleepiness. What side effects may I notice from receiving this medicine? Side effects that you should report to your doctor or health care professional as soon as possible: -allergic reactions like skin rash, itching or hives, swelling of the face, lips, or tongue -breathing problems -confusion -loss of balance or coordination -signs and symptoms of low blood pressure like dizziness; feeling faint or lightheaded, falls; unusually weak or tired -suicidal  thoughts or other mood changes Side effects that usually do not require medical attention (report to your doctor or health care professional if they continue or are bothersome): -dizziness -headache -nausea, vomiting -tiredness This list may not describe all possible side effects. Call your doctor for medical advice about side effects. You may report side effects to FDA at 1-800-FDA-1088. Where should I keep my medicine? Keep out of the reach of children. This medicine can be abused. Keep your medicine in a safe place to protect it from theft. Do not share this medicine with anyone. Selling or giving away this medicine is dangerous and against the law. This medicine may cause accidental overdose and death if taken by other adults, children, or pets. Mix any unused medicine with a substance like cat litter or coffee grounds. Then throw the medicine away in a sealed container like a sealed bag or a coffee can with a lid. Do not use the medicine after the expiration date. Store at room temperature between 20 and 25 degrees C (68 and 77 degrees F). Protect from light. Keep container tightly closed. NOTE: This sheet is a summary. It may not cover all possible information. If you have questions about this medicine, talk to your doctor, pharmacist, or health care provider.  2018 [employer]/Gold Standard (2015-06-22 15:54:27)  Buspirone tablets What is this medicine? BUSPIRONE (byoo SPYE rone) is used to treat anxiety disorders. This medicine may be used for other purposes; ask your health care provider or pharmacist if you have questions. COMMON BRAND NAME(S): BuSpar What should I tell my health care provider before I take this medicine? They need to know if you  have any of these conditions: -kidney or liver disease -an unusual or allergic reaction to buspirone, other medicines, foods, dyes, or preservatives -pregnant or trying to get pregnant -breast-feeding How should I use this medicine? Take  this medicine by mouth with a glass of water. Follow the directions on the prescription label. You may take this medicine with or without food. To ensure that this medicine always works the same way for you, you should take it either always with or always without food. Take your doses at regular intervals. Do not take your medicine more often than directed. Do not stop taking except on the advice of your doctor or health care professional. Talk to your pediatrician regarding the use of this medicine in children. Special care may be needed. Overdosage: If you think you have taken too much of this medicine contact a poison control center or emergency room at once. NOTE: This medicine is only for you. Do not share this medicine with others. What if I miss a dose? If you miss a dose, take it as soon as you can. If it is almost time for your next dose, take only that dose. Do not take double or extra doses. What may interact with this medicine? Do not take this medicine with any of the following medications: -linezolid -MAOIs like Carbex, Eldepryl, Marplan, Nardil, and Parnate -methylene blue -procarbazine This medicine may also interact with the following medications: -diazepam -digoxin -diltiazem -erythromycin -grapefruit juice -haloperidol -medicines for mental depression or mood problems -medicines for seizures like carbamazepine, phenobarbital and phenytoin -nefazodone -other medications for anxiety -rifampin -ritonavir -some antifungal medicines like itraconazole, ketoconazole, and voriconazole -verapamil -warfarin This list may not describe all possible interactions. Give your health care provider a list of all the medicines, herbs, non-prescription drugs, or dietary supplements you use. Also tell them if you smoke, drink alcohol, or use illegal drugs. Some items may interact with your medicine. What should I watch for while using this medicine? Visit your doctor or health care  professional for regular checks on your progress. It may take 1 to 2 weeks before your anxiety gets better. You may get drowsy or dizzy. Do not drive, use machinery, or do anything that needs mental alertness until you know how this drug affects you. Do not stand or sit up quickly, especially if you are an older patient. This reduces the risk of dizzy or fainting spells. Alcohol can make you more drowsy and dizzy. Avoid alcoholic drinks. What side effects may I notice from receiving this medicine? Side effects that you should report to your doctor or health care professional as soon as possible: -blurred vision or other vision changes -chest pain -confusion -difficulty breathing -feelings of hostility or anger -muscle aches and pains -numbness or tingling in hands or feet -ringing in the ears -skin rash and itching -vomiting -weakness Side effects that usually do not require medical attention (report to your doctor or health care professional if they continue or are bothersome): -disturbed dreams, nightmares -headache -nausea -restlessness or nervousness -sore throat and nasal congestion -stomach upset This list may not describe all possible side effects. Call your doctor for medical advice about side effects. You may report side effects to FDA at 1-800-FDA-1088. Where should I keep my medicine? Keep out of the reach of children. Store at room temperature below 30 degrees C (86 degrees F). Protect from light. Keep container tightly closed. Throw away any unused medicine after the expiration date. NOTE: This sheet is a summary. It  may not cover all possible information. If you have questions about this medicine, talk to your doctor, pharmacist, or health care provider.  2018 [employer]/Gold Standard (2010-05-03 18:06:11)

## 2018-10-19 ENCOUNTER — Ambulatory Visit: Payer: BC Managed Care – PPO | Admitting: Internal Medicine

## 2018-10-20 ENCOUNTER — Encounter: Payer: Self-pay | Admitting: Internal Medicine

## 2018-10-20 ENCOUNTER — Ambulatory Visit: Payer: BC Managed Care – PPO | Admitting: Internal Medicine

## 2018-10-20 VITALS — BP 100/64 | HR 97 | Temp 98.7°F | Ht 64.0 in | Wt 125.0 lb

## 2018-10-20 DIAGNOSIS — F419 Anxiety disorder, unspecified: Secondary | ICD-10-CM | POA: Diagnosis not present

## 2018-10-20 DIAGNOSIS — F411 Generalized anxiety disorder: Secondary | ICD-10-CM

## 2018-10-20 DIAGNOSIS — F430 Acute stress reaction: Secondary | ICD-10-CM

## 2018-10-20 DIAGNOSIS — R5383 Other fatigue: Secondary | ICD-10-CM | POA: Diagnosis not present

## 2018-10-20 DIAGNOSIS — F32A Depression, unspecified: Secondary | ICD-10-CM

## 2018-10-20 DIAGNOSIS — F329 Major depressive disorder, single episode, unspecified: Secondary | ICD-10-CM

## 2018-10-20 DIAGNOSIS — F4323 Adjustment disorder with mixed anxiety and depressed mood: Secondary | ICD-10-CM

## 2018-10-20 MED ORDER — BUPROPION HCL 75 MG PO TABS: 37.5000 mg | ORAL_TABLET | Freq: Two times a day (BID) | ORAL | 0 refills | Status: DC

## 2018-10-20 MED ORDER — BUSPIRONE HCL 5 MG PO TABS: 5.0000 mg | ORAL_TABLET | Freq: Two times a day (BID) | ORAL | 0 refills | Status: DC

## 2018-10-20 NOTE — Progress Notes (Signed)
Chief Complaint  Patient presents with  . Follow-up   F/u anxiety and depression PHQ 9 2 and GAD 7 score 2 today c/o fatigue on celexa 5 mg qhs and wants to change to wellbutrin due to SIL on it and helps  Review of Systems  Constitutional: Negative for weight loss.  HENT: Negative for hearing loss.   Eyes: Negative for blurred vision.  Respiratory: Negative for shortness of breath.   Cardiovascular: Negative for chest pain.  Skin: Negative for rash.  Neurological: Negative for headaches.  Psychiatric/Behavioral: Negative for depression.   Past Medical History:  Diagnosis Date  . Allergy   . Anxiety   . 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   . BRCA negative 03/2016   MyRisk neg except NBN VUS  . Chicken pox   . Complication of anesthesia   . Family history of breast cancer 03/2016   IBIS=18.4%  . PONV (postoperative nausea and vomiting)    Past Surgical History:  Procedure Laterality Date  . BONE CYST EXCISION     right foot.   Family History  Problem Relation Age of Onset  . Cancer Mother 91       breast / died at 53  . Hypertension Mother   . Depression Mother   . Arthritis Father   . Depression Father        committed suicide   . Hypertension Father   . Hypertension Sister   . Heart disease Sister        heart murmur  . Birth defects Sister   . Depression Sister   . Arthritis Maternal Grandmother   . Other Maternal Grandmother        elevated tau protein   . Cancer Paternal Grandmother        lung  . Emphysema Paternal Grandfather    Social History   Socioeconomic History  . Marital status: Single    Spouse name: Not on file  . Number of children: Not on file  . Years of education: Not on file  . Highest education level: Not on file  Occupational History  . Not on file  Social Needs  . Financial resource strain: Not on file  . Food insecurity:    Worry: Not on file    Inability: Not on file  . Transportation needs:     Medical: Not on file    Non-medical: Not on file  Tobacco Use  . Smoking status: Never Smoker  . Smokeless tobacco: Never Used  Substance and Sexual Activity  . Alcohol use: Yes    Comment: social  . Drug use: No  . Sexual activity: Yes    Partners: Male    Birth control/protection: I.U.D.  Lifestyle  . Physical activity:    Days per week: Not on file    Minutes per session: Not on file  . Stress: Not on file  Relationships  . Social connections:    Talks on phone: Not on file    Gets together: Not on file    Attends religious service: Not on file    Active member of club or organization: Not on file    Attends meetings of clubs or organizations: Not on file    Relationship status: Not on file  . Intimate partner violence:    Fear of current or ex partner: Not on file    Emotionally abused: Not on file    Physically abused: Not on  file    Forced sexual activity: Not on file  Other Topics Concern  . 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 EPINEPHrine (EPIPEN 2-PAK) 0.3 mg/0.3 mL IJ SOAJ injection Inject into the muscle once.  . Levonorgestrel (KYLEENA) 19.5 MG IUD by Intrauterine route.   Allergies  Allergen Reactions  . Cefadroxil Nausea And Vomiting  . [employer]     anaphylaxis  . Septra [Sulfamethoxazole-Trimethoprim] Nausea And Vomiting   No results found for this or any previous visit (from the past 2160 hour(s)). Objective  Body mass index is 21.46 kg/m. Wt Readings from Last 3 Encounters:  10/20/18 125 lb (56.7 kg)  08/14/18 127 lb 6 oz (57.8 kg)  06/16/18 126 lb (57.2 kg)   Temp Readings from Last 3 Encounters:  10/20/18 98.7 F (37.1 C)  08/14/18 98.1 F (36.7 C) (Oral)  10/17/17 99 F (37.2 C) (Oral)   BP Readings from Last 3 Encounters:  10/20/18 100/64  08/14/18 122/80  06/16/18 118/74   Pulse Readings from Last 3 Encounters:  10/20/18 97   08/14/18 79  02/25/18 (!) 57    Physical Exam Vitals signs and nursing note reviewed.  Constitutional:      Appearance: Normal appearance. She is well-developed and normal weight.  HENT:     Head: Normocephalic and atraumatic.     Nose: Nose normal.     Mouth/Throat:     Mouth: Mucous membranes are moist.     Pharynx: Oropharynx is clear.  Eyes:     Conjunctiva/sclera: Conjunctivae normal.     Pupils: Pupils are equal, round, and reactive to light.  Cardiovascular:     Rate and Rhythm: Normal rate and regular rhythm.     Heart sounds: Normal heart sounds.  Pulmonary:     Effort: Pulmonary effort is normal.     Breath sounds: Normal breath sounds.  Skin:    General: Skin is warm and dry.  Neurological:     General: No focal deficit present.     Mental Status: She is alert and oriented to person, place, and time.     Gait: Gait normal.  Psychiatric:        Attention and Perception: Attention and perception normal.        Mood and Affect: Mood and affect normal.        Speech: Speech normal.        Behavior: Behavior normal. Behavior is cooperative.        Thought Content: Thought content normal.        Cognition and Memory: Cognition and memory normal.        Judgment: Judgment normal.     Assessment   1. Anxiety and depression PHQ 9 score and GAD7 score 2  2. HM Plan   1. D/c celexa 5 mg qhs  Change to wellbutrin and buspar  F/u in 2-4 weeks 2.  Flu shot had 07/2018 work  Tdap pt thinks had 2014/2015  Had hpv vaccine age 15 y.o   Hep B immune  sch fasting labs 10/27/2018 Declines sTD check   Pap had 06/13/17 neg not hpv tested. F/u 11/2018 Kyleena IUD due to be replaced 02/22/21 My Myriad and BRCA neg mom died 83 y.o died 18 y.o  H/o Bx neck [place] saw 1 year ago bx normal   Provider: Dr. Olivia Mackie McLean-[facility]

## 2018-10-20 NOTE — Patient Instructions (Addendum)
Stop Celexa   Bupropion tablets (Depression/Mood Disorders) What is this medicine? BUPROPION (byoo PROE pee on) is used to treat depression. This medicine may be used for other purposes; ask your health care provider or pharmacist if you have questions. COMMON BRAND NAME(S): Wellbutrin What should I tell my health care provider before I take this medicine? They need to know if you have any of these conditions: -an eating disorder, such as anorexia or bulimia -bipolar disorder or psychosis -diabetes or high blood sugar, treated with medication -glaucoma -heart disease, previous heart attack, or irregular heart beat -head injury or brain tumor -high blood pressure -kidney or liver disease -seizures -suicidal thoughts or a previous suicide attempt -Tourette's syndrome -weight loss -an unusual or allergic reaction to bupropion, other medicines, foods, dyes, or preservatives -breast-feeding -pregnant or trying to become pregnant How should I use this medicine? Take this medicine by mouth with a glass of water. Follow the directions on the prescription label. You can take it with or without food. If it upsets your stomach, take it with food. Take your medicine at regular intervals. Do not take your medicine more often than directed. Do not stop taking this medicine suddenly except upon the advice of your doctor. Stopping this medicine too quickly may cause serious side effects or your condition may worsen. A special MedGuide will be given to you by the pharmacist with each prescription and refill. Be sure to read this information carefully each time. Talk to your pediatrician regarding the use of this medicine in children. Special care may be needed. Overdosage: If you think you have taken too much of this medicine contact a poison control center or emergency room at once. NOTE: This medicine is only for you. Do not share this medicine with others. What if I miss a dose? If you miss a dose,  take it as soon as you can. If it is less than four hours to your next dose, take only that dose and skip the missed dose. Do not take double or extra doses. What may interact with this medicine? Do not take this medicine with any of the following medications: -linezolid -MAOIs like Azilect, Carbex, Eldepryl, Marplan, Nardil, and Parnate -methylene blue (injected into a vein) -other medicines that contain bupropion like Zyban This medicine may also interact with the following medications: -alcohol -certain medicines for anxiety or sleep -certain medicines for blood pressure like metoprolol, propranolol -certain medicines for depression or psychotic disturbances -certain medicines for HIV or AIDS like efavirenz, lopinavir, nelfinavir, ritonavir -certain medicines for irregular heart beat like propafenone, flecainide -certain medicines for Parkinson's disease like amantadine, levodopa -certain medicines for seizures like carbamazepine, phenytoin, phenobarbital -cimetidine -clopidogrel -cyclophosphamide -digoxin -furazolidone -isoniazid -nicotine -orphenadrine -procarbazine -steroid medicines like prednisone or cortisone -stimulant medicines for attention disorders, weight loss, or to stay awake -tamoxifen -theophylline -thiotepa -ticlopidine -tramadol -warfarin This list may not describe all possible interactions. Give your health care provider a list of all the medicines, herbs, non-prescription drugs, or dietary supplements you use. Also tell them if you smoke, drink alcohol, or use illegal drugs. Some items may interact with your medicine. What should I watch for while using this medicine? Tell your doctor if your symptoms do not get better or if they get worse. Visit your doctor or health care professional for regular checks on your progress. Because it may take several weeks to see the full effects of this medicine, it is important to continue your treatment as prescribed by  your doctor.  Patients and their families should watch out for new or worsening thoughts of suicide or depression. Also watch out for sudden changes in feelings such as feeling anxious, agitated, panicky, irritable, hostile, aggressive, impulsive, severely restless, overly excited and hyperactive, or not being able to sleep. If this happens, especially at the beginning of treatment or after a change in dose, call your health care professional. Avoid alcoholic drinks while taking this medicine. Drinking excessive alcoholic beverages, using sleeping or anxiety medicines, or quickly stopping the use of these agents while taking this medicine may increase your risk for a seizure. Do not drive or use heavy machinery until you know how this medicine affects you. This medicine can impair your ability to perform these tasks. Do not take this medicine close to bedtime. It may prevent you from sleeping. Your mouth may get dry. Chewing sugarless gum or sucking hard candy, and drinking plenty of water may help. Contact your doctor if the problem does not go away or is severe. What side effects may I notice from receiving this medicine? Side effects that you should report to your doctor or health care professional as soon as possible: -allergic reactions like skin rash, itching or hives, swelling of the face, lips, or tongue -breathing problems -changes in vision -confusion -elevated mood, decreased need for sleep, racing thoughts, impulsive behavior -fast or irregular heartbeat -hallucinations, loss of contact with reality -increased blood pressure -redness, blistering, peeling or loosening of the skin, including inside the mouth -seizures -suicidal thoughts or other mood changes -unusually weak or tired -vomiting Side effects that usually do not require medical attention (report to your doctor or health care professional if they continue or are bothersome): -constipation -headache -loss of  appetite -nausea -tremors -weight loss This list may not describe all possible side effects. Call your doctor for medical advice about side effects. You may report side effects to FDA at 1-800-FDA-1088. Where should I keep my medicine? Keep out of the reach of children. Store at room temperature between 20 and 25 degrees C (68 and 77 degrees F), away from direct sunlight and moisture. Keep tightly closed. Throw away any unused medicine after the expiration date. NOTE: This sheet is a summary. It may not cover all possible information. If you have questions about this medicine, talk to your doctor, pharmacist, or health care provider.  2019 Elsevier/Gold Standard (2016-03-15 13:44:21)  Buspirone tablets What is this medicine? BUSPIRONE (byoo SPYE rone) is used to treat anxiety disorders. This medicine may be used for other purposes; ask your health care provider or pharmacist if you have questions. COMMON BRAND NAME(S): BuSpar What should I tell my health care provider before I take this medicine? They need to know if you have any of these conditions: -kidney or liver disease -an unusual or allergic reaction to buspirone, other medicines, foods, dyes, or preservatives -pregnant or trying to get pregnant -breast-feeding How should I use this medicine? Take this medicine by mouth with a glass of water. Follow the directions on the prescription label. You may take this medicine with or without food. To ensure that this medicine always works the same way for you, you should take it either always with or always without food. Take your doses at regular intervals. Do not take your medicine more often than directed. Do not stop taking except on the advice of your doctor or health care professional. Talk to your pediatrician regarding the use of this medicine in children. Special care may be needed. Overdosage: If  you think you have taken too much of this medicine contact a poison control center or  emergency room at once. NOTE: This medicine is only for you. Do not share this medicine with others. What if I miss a dose? If you miss a dose, take it as soon as you can. If it is almost time for your next dose, take only that dose. Do not take double or extra doses. What may interact with this medicine? Do not take this medicine with any of the following medications: -linezolid -MAOIs like Carbex, Eldepryl, Marplan, Nardil, and Parnate -methylene blue -procarbazine This medicine may also interact with the following medications: -diazepam -digoxin -diltiazem -erythromycin -grapefruit juice -haloperidol -medicines for mental depression or mood problems -medicines for seizures like carbamazepine, phenobarbital and phenytoin -nefazodone -other medications for anxiety -rifampin -ritonavir -some antifungal medicines like itraconazole, ketoconazole, and voriconazole -verapamil -warfarin This list may not describe all possible interactions. Give your health care provider a list of all the medicines, herbs, non-prescription drugs, or dietary supplements you use. Also tell them if you smoke, drink alcohol, or use illegal drugs. Some items may interact with your medicine. What should I watch for while using this medicine? Visit your doctor or health care professional for regular checks on your progress. It may take 1 to 2 weeks before your anxiety gets better. You may get drowsy or dizzy. Do not drive, use machinery, or do anything that needs mental alertness until you know how this drug affects you. Do not stand or sit up quickly, especially if you are an older patient. This reduces the risk of dizzy or fainting spells. Alcohol can make you more drowsy and dizzy. Avoid alcoholic drinks. What side effects may I notice from receiving this medicine? Side effects that you should report to your doctor or health care professional as soon as possible: -blurred vision or other vision changes -chest  pain -confusion -difficulty breathing -feelings of hostility or anger -muscle aches and pains -numbness or tingling in hands or feet -ringing in the ears -skin rash and itching -vomiting -weakness Side effects that usually do not require medical attention (report to your doctor or health care professional if they continue or are bothersome): -disturbed dreams, nightmares -headache -nausea -restlessness or nervousness -sore throat and nasal congestion -stomach upset This list may not describe all possible side effects. Call your doctor for medical advice about side effects. You may report side effects to FDA at 1-800-FDA-1088. Where should I keep my medicine? Keep out of the reach of children. Store at room temperature below 30 degrees C (86 degrees F). Protect from light. Keep container tightly closed. Throw away any unused medicine after the expiration date. NOTE: This sheet is a summary. It may not cover all possible information. If you have questions about this medicine, talk to your doctor, pharmacist, or health care provider.  2019 Elsevier/Gold Standard (2010-05-03 18:06:11)

## 2018-10-27 ENCOUNTER — Other Ambulatory Visit (INDEPENDENT_AMBULATORY_CARE_PROVIDER_SITE_OTHER): Payer: BC Managed Care – PPO

## 2018-10-27 DIAGNOSIS — F411 Generalized anxiety disorder: Secondary | ICD-10-CM | POA: Diagnosis not present

## 2018-10-27 DIAGNOSIS — F430 Acute stress reaction: Secondary | ICD-10-CM

## 2018-10-27 DIAGNOSIS — Z13220 Encounter for screening for lipoid disorders: Secondary | ICD-10-CM | POA: Diagnosis not present

## 2018-10-27 DIAGNOSIS — Z1329 Encounter for screening for other suspected endocrine disorder: Secondary | ICD-10-CM | POA: Diagnosis not present

## 2018-10-27 DIAGNOSIS — Z1389 Encounter for screening for other disorder: Secondary | ICD-10-CM

## 2018-10-27 DIAGNOSIS — Z0000 Encounter for general adult medical examination without abnormal findings: Secondary | ICD-10-CM | POA: Diagnosis not present

## 2018-10-27 DIAGNOSIS — Z1159 Encounter for screening for other viral diseases: Secondary | ICD-10-CM

## 2018-10-27 DIAGNOSIS — Z0184 Encounter for antibody response examination: Secondary | ICD-10-CM

## 2018-10-27 DIAGNOSIS — E559 Vitamin D deficiency, unspecified: Secondary | ICD-10-CM | POA: Diagnosis not present

## 2018-10-27 LAB — LIPID PANEL
CHOLESTEROL: 131 mg/dL (ref 0–200)
HDL: 42.9 mg/dL (ref >39.00)
LDL Cholesterol: 76 mg/dL (ref 0–99)
NonHDL: 88.25
Total CHOL/HDL Ratio: 3
Triglycerides: 62 mg/dL (ref 0.0–149.0)
VLDL: 12.4 mg/dL (ref 0.0–40.0)

## 2018-10-27 LAB — COMPREHENSIVE METABOLIC PANEL
ALBUMIN: 4.7 g/dL (ref 3.5–5.2)
ALK PHOS: 53 U/L (ref 39–117)
ALT: 13 U/L (ref 0–35)
AST: 16 U/L (ref 0–37)
BILIRUBIN TOTAL: 0.7 mg/dL (ref 0.2–1.2)
BUN: 12 mg/dL (ref 6–23)
CO2: 26 mEq/L (ref 19–32)
Calcium: 9.9 mg/dL (ref 8.4–10.5)
Chloride: 103 mEq/L (ref 96–112)
Creatinine, Ser: 0.72 mg/dL (ref 0.40–1.20)
GFR: 94.56 mL/min (ref >60.00)
Glucose, Bld: 87 mg/dL (ref 70–99)
POTASSIUM: 3.8 meq/L (ref 3.5–5.1)
Sodium: 137 mEq/L (ref 135–145)
TOTAL PROTEIN: 7.2 g/dL (ref 6.0–8.3)

## 2018-10-27 LAB — CBC WITH DIFFERENTIAL/PLATELET
BASOS PCT: 1.2 % (ref 0.0–3.0)
Basophils Absolute: 0.1 10*3/uL (ref 0.0–0.1)
EOS PCT: 1.6 % (ref 0.0–5.0)
Eosinophils Absolute: 0.1 10*3/uL (ref 0.0–0.7)
HCT: 41.7 % (ref 36.0–46.0)
HEMOGLOBIN: 14 g/dL (ref 12.0–15.0)
LYMPHS ABS: 1.7 10*3/uL (ref 0.7–4.0)
Lymphocytes Relative: 27.5 % (ref 12.0–46.0)
MCHC: 33.7 g/dL (ref 30.0–36.0)
MCV: 95.9 fl (ref 78.0–100.0)
Monocytes Absolute: 0.4 10*3/uL (ref 0.1–1.0)
Monocytes Relative: 6.1 % (ref 3.0–12.0)
Neutro Abs: 4 10*3/uL (ref 1.4–7.7)
Neutrophils Relative %: 63.6 % (ref 43.0–77.0)
Platelets: 281 10*3/uL (ref 150.0–400.0)
RBC: 4.35 Mil/uL (ref 3.87–5.11)
RDW: 12.7 % (ref 11.5–15.5)
WBC: 6.3 10*3/uL (ref 4.0–10.5)

## 2018-10-27 LAB — TSH: TSH: 2.24 u[IU]/mL (ref 0.35–4.50)

## 2018-10-27 LAB — VITAMIN D 25 HYDROXY (VIT D DEFICIENCY, FRACTURES): VITD: 31.59 ng/mL (ref 30.00–100.00)

## 2018-10-27 NOTE — Addendum Note (Signed)
Addended by: Arby Barrette on: 10/27/2018 10:13 AM   Modules accepted: Orders

## 2018-10-28 LAB — MEASLES/MUMPS/RUBELLA IMMUNITY
Mumps IgG: 57.1 AU/mL
RUBELLA: 2.57 {index}
Rubeola IgG: 124 AU/mL

## 2018-10-28 NOTE — Addendum Note (Signed)
Addended by: Leeanne Rio on: 10/28/2018 04:35 PM   Modules accepted: Orders

## 2018-10-29 LAB — URINALYSIS, ROUTINE W REFLEX MICROSCOPIC
Bilirubin, UA: NEGATIVE
Glucose, UA: NEGATIVE
Ketones, UA: NEGATIVE
Nitrite, UA: NEGATIVE
PH UA: 6.5 (ref 5.0–7.5)
Protein, UA: NEGATIVE
RBC, UA: NEGATIVE
Specific Gravity, UA: 1.007 (ref 1.005–1.030)
Urobilinogen, Ur: 0.2 mg/dL (ref 0.2–1.0)

## 2018-10-29 LAB — MICROSCOPIC EXAMINATION: Casts: NONE SEEN /lpf

## 2018-11-10 ENCOUNTER — Ambulatory Visit: Payer: BC Managed Care – PPO | Admitting: Internal Medicine

## 2018-11-10 ENCOUNTER — Other Ambulatory Visit: Payer: Self-pay | Admitting: *Deleted

## 2018-11-10 ENCOUNTER — Encounter: Payer: Self-pay | Admitting: Internal Medicine

## 2018-11-10 VITALS — BP 128/68 | HR 72 | Temp 98.3°F | Ht 64.0 in | Wt 125.4 lb

## 2018-11-10 DIAGNOSIS — R8281 Pyuria: Secondary | ICD-10-CM

## 2018-11-10 DIAGNOSIS — F430 Acute stress reaction: Secondary | ICD-10-CM

## 2018-11-10 DIAGNOSIS — R41840 Attention and concentration deficit: Secondary | ICD-10-CM

## 2018-11-10 DIAGNOSIS — F329 Major depressive disorder, single episode, unspecified: Secondary | ICD-10-CM

## 2018-11-10 DIAGNOSIS — F419 Anxiety disorder, unspecified: Secondary | ICD-10-CM

## 2018-11-10 DIAGNOSIS — F411 Generalized anxiety disorder: Secondary | ICD-10-CM

## 2018-11-10 DIAGNOSIS — F32A Depression, unspecified: Secondary | ICD-10-CM

## 2018-11-10 DIAGNOSIS — F4323 Adjustment disorder with mixed anxiety and depressed mood: Secondary | ICD-10-CM

## 2018-11-10 MED ORDER — BUPROPION HCL 75 MG PO TABS: 37.5000 mg | ORAL_TABLET | Freq: Two times a day (BID) | ORAL | 3 refills | Status: DC

## 2018-11-10 MED ORDER — BUSPIRONE HCL 5 MG PO TABS: 5.0000 mg | ORAL_TABLET | Freq: Two times a day (BID) | ORAL | 3 refills | Status: DC

## 2018-11-10 NOTE — Patient Instructions (Addendum)
D3 2000 IU daily

## 2018-11-10 NOTE — Progress Notes (Signed)
Pre visit review using our clinic review tool, if applicable. No additional management support is needed unless otherwise documented below in the visit note. 

## 2018-11-10 NOTE — Progress Notes (Signed)
Chief Complaint  Patient presents with  . Follow-up   F/u  1. Anxiety/depression PHQ 9 score 3, GAD 7 score 7 sx's improved on wellbutrin 37.5 mg bid and buspar taking 2.5 mg bid 3rd or 4th day had increased anxiety but now ok and energy improved since off celexa. She also initially had a h/a  2. C/w attention issues and c/w ADD/ADHD    Review of Systems  Constitutional: Negative for malaise/fatigue and weight loss.  HENT: Negative for hearing loss.   Eyes: Negative for blurred vision.  Respiratory: Negative for shortness of breath.   Cardiovascular: Negative for chest pain.  Gastrointestinal: Negative for abdominal pain.  Skin: Negative for rash.  Neurological: Negative for headaches.  Psychiatric/Behavioral: Negative for depression. The patient is nervous/anxious.    Past Medical History:  Diagnosis Date  . Allergy   . Anxiety   . Bone tumor (benign)    tumor vs cyst removed btwn age [age] y.o.   . BRCA negative 03/2016   MyRisk neg except NBN VUS  . Chicken pox   . Complication of anesthesia   . Family history of breast cancer 03/2016   IBIS=18.4%  . PONV (postoperative nausea and vomiting)    Past Surgical History:  Procedure Laterality Date  . BONE CYST EXCISION     right foot.   Family History  Problem Relation Age of Onset  . Cancer Mother 54       breast / died at 50  . Hypertension Mother   . Depression Mother   . Arthritis Father   . Depression Father        committed suicide   . Hypertension Father   . Hypertension Sister   . Heart disease Sister        heart murmur  . Birth defects Sister   . Depression Sister   . Arthritis Maternal Grandmother   . Other Maternal Grandmother        elevated tau protein   . Cancer Paternal Grandmother        lung  . Emphysema Paternal Grandfather    Social History   Socioeconomic History  . Marital status: Single    Spouse name: Not on file  . Number of children: Not on file  . Years of education: Not on  file  . Highest education level: Not on file  Occupational History  . Not on file  Social Needs  . Financial resource strain: Not on file  . Food insecurity:    Worry: Not on file    Inability: Not on file  . Transportation needs:    Medical: Not on file    Non-medical: Not on file  Tobacco Use  . Smoking status: Never Smoker  . Smokeless tobacco: Never Used  Substance and Sexual Activity  . Alcohol use: Yes    Comment: social  . Drug use: No  . Sexual activity: Yes    Partners: Male    Birth control/protection: I.U.D.  Lifestyle  . Physical activity:    Days per week: Not on file    Minutes per session: Not on file  . Stress: Not on file  Relationships  . Social connections:    Talks on phone: Not on file    Gets together: Not on file    Attends religious service: Not on file    Active member of club or organization: Not on file    Attends meetings of clubs or organizations: Not on file  Relationship status: Not on file  . Intimate partner violence:    Fear of current or ex partner: Not on file    Emotionally abused: Not on file    Physically abused: Not on file    Forced sexual activity: Not on file  Other Topics Concern  . 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 buPROPion (WELLBUTRIN) 75 MG tablet Take 0.5 tablets (37.5 mg total) by mouth 2 (two) times daily. PLEASE CUT PILLS IN 1/2  . busPIRone (BUSPAR) 5 MG tablet Take 1 tablet (5 mg total) by mouth 2 (two) times daily.  Marland Kitchen EPINEPHrine (EPIPEN 2-PAK) 0.3 mg/0.3 mL IJ SOAJ injection Inject into the muscle once.  . Levonorgestrel (KYLEENA) 19.5 MG IUD by Intrauterine route.  . [DISCONTINUED] buPROPion (WELLBUTRIN) 75 MG tablet Take 0.5 tablets (37.5 mg total) by mouth 2 (two) times daily.  . [DISCONTINUED] busPIRone (BUSPAR) 5 MG tablet Take 1 tablet (5 mg total) by mouth 2 (two) times daily.   Allergies  Allergen  Reactions  . Cefadroxil Nausea And Vomiting  . Fire Dynegy     anaphylaxis  . Septra [Sulfamethoxazole-Trimethoprim] Nausea And Vomiting  . Zoloft [Sertraline Hcl]     Abnormal dreams     Recent Results (from the past 2160 hour(s))  Measles/Mumps/Rubella Immunity     Status: None   Collection Time: 10/27/18 10:14 AM  Result Value Ref Range   Rubeola IgG 124.00 AU/mL    Comment: AU/mL            Interpretation -----            -------------- <13.50           Negative 13.50-16.49      Equivocal >16.49           Positive . A positive result indicates that the patient has antibody to measles virus. It does not differentiate  between an active or past infection. The clinical  diagnosis must be interpreted in conjunction with  clinical signs and symptoms of the patient.    Mumps IgG 57.10 AU/mL    Comment:  AU/mL           Interpretation -------         ---------------- <9.00             Negative 9.00-10.99        Equivocal >10.99            Positive A positive result indicates that the patient has  antibody to mumps virus. It does not differentiate between an  active or past infection. The clinical diagnosis must be interpreted in conjunction with clinical signs and symptoms of the patient. .    Rubella 2.57 index    Comment:     Index            Interpretation     -----            --------------       <0.90            Not consistent with Immunity     0.90-0.99        Equivocal     > or = 1.00      Consistent with Immunity  . The presence of rubella IgG antibody suggests  immunization or past or current infection with rubella virus.   Vitamin D (25  hydroxy)     Status: None   Collection Time: 10/27/18 10:14 AM  Result Value Ref Range   VITD 31.59 30.00 - 100.00 ng/mL  TSH     Status: None   Collection Time: 10/27/18 10:14 AM  Result Value Ref Range   TSH 2.24 0.35 - 4.50 uIU/mL  Lipid panel     Status: None   Collection Time: 10/27/18 10:14 AM  Result Value Ref  Range   Cholesterol 131 0 - 200 mg/dL    Comment: ATP III Classification       Desirable:  < 200 mg/dL               Borderline High:  200 - 239 mg/dL          High:  > = 240 mg/dL   Triglycerides 62.0 0.0 - 149.0 mg/dL    Comment: Normal:  <150 mg/dLBorderline High:  150 - 199 mg/dL   HDL 42.90 >39.00 mg/dL   VLDL 12.4 0.0 - 40.0 mg/dL   LDL Cholesterol 76 0 - 99 mg/dL   Total CHOL/HDL Ratio 3     Comment:                Men          Women1/2 Average Risk     3.4          3.3Average Risk          5.0          4.42X Average Risk          9.6          7.13X Average Risk          15.0          11.0                       NonHDL 88.25     Comment: NOTE:  Non-HDL goal should be 30 mg/dL higher than patient's LDL goal (i.e. LDL goal of < 70 mg/dL, would have non-HDL goal of < 100 mg/dL)  CBC with Differential/Platelet     Status: None   Collection Time: 10/27/18 10:14 AM  Result Value Ref Range   WBC 6.3 4.0 - 10.5 K/uL   RBC 4.35 3.87 - 5.11 Mil/uL   Hemoglobin 14.0 12.0 - 15.0 g/dL   HCT 41.7 36.0 - 46.0 %   MCV 95.9 78.0 - 100.0 fl   MCHC 33.7 30.0 - 36.0 g/dL   RDW 12.7 11.5 - 15.5 %   Platelets 281.0 150.0 - 400.0 K/uL   Neutrophils Relative % 63.6 43.0 - 77.0 %   Lymphocytes Relative 27.5 12.0 - 46.0 %   Monocytes Relative 6.1 3.0 - 12.0 %   Eosinophils Relative 1.6 0.0 - 5.0 %   Basophils Relative 1.2 0.0 - 3.0 %   Neutro Abs 4.0 1.4 - 7.7 K/uL   Lymphs Abs 1.7 0.7 - 4.0 K/uL   Monocytes Absolute 0.4 0.1 - 1.0 K/uL   Eosinophils Absolute 0.1 0.0 - 0.7 K/uL   Basophils Absolute 0.1 0.0 - 0.1 K/uL  Comprehensive metabolic panel     Status: None   Collection Time: 10/27/18 10:14 AM  Result Value Ref Range   Sodium 137 135 - 145 mEq/L   Potassium 3.8 3.5 - 5.1 mEq/L   Chloride 103 96 - 112 mEq/L   CO2 26 19 - 32 mEq/L   Glucose, Bld 87 70 - 99 mg/dL  BUN 12 6 - 23 mg/dL   Creatinine, Ser 0.72 0.40 - 1.20 mg/dL   Total Bilirubin 0.7 0.2 - 1.2 mg/dL   Alkaline Phosphatase 53  39 - 117 U/L   AST 16 0 - 37 U/L   ALT 13 0 - 35 U/L   Total Protein 7.2 6.0 - 8.3 g/dL   Albumin 4.7 3.5 - 5.2 g/dL   Calcium 9.9 8.4 - 10.5 mg/dL   GFR 94.56 >60.00 mL/min  Urinalysis, Routine w reflex microscopic     Status: Abnormal   Collection Time: 10/27/18 10:14 AM  Result Value Ref Range   Specific Gravity, UA 1.007 1.005 - 1.030   pH, UA 6.5 5.0 - 7.5   Color, UA Yellow Yellow   Appearance Ur Clear Clear   Leukocytes, UA Trace (A) Negative   Protein, UA Negative Negative/Trace   Glucose, UA Negative Negative   Ketones, UA Negative Negative   RBC, UA Negative Negative   Bilirubin, UA Negative Negative   Urobilinogen, Ur 0.2 0.2 - 1.0 mg/dL   Nitrite, UA Negative Negative   Microscopic Examination See below:     Comment: Microscopic was indicated and was performed.  Microscopic Examination     Status: None   Collection Time: 10/27/18 10:14 AM  Result Value Ref Range   WBC, UA 0-5 0 - 5 /hpf   RBC, UA 0-2 0 - 2 /hpf   Epithelial Cells (non renal) 0-10 0 - 10 /hpf   Casts None seen None seen /lpf   Mucus, UA Present Not Estab.   Bacteria, UA Few None seen/Few   Objective  Body mass index is 21.52 kg/m. Wt Readings from Last 3 Encounters:  11/10/18 125 lb 6.4 oz (56.9 kg)  10/20/18 125 lb (56.7 kg)  08/14/18 127 lb 6 oz (57.8 kg)   Temp Readings from Last 3 Encounters:  11/10/18 98.3 F (36.8 C) (Oral)  10/20/18 98.7 F (37.1 C)  08/14/18 98.1 F (36.7 C) (Oral)   BP Readings from Last 3 Encounters:  11/10/18 128/68  10/20/18 100/64  08/14/18 122/80   Pulse Readings from Last 3 Encounters:  11/10/18 72  10/20/18 97  08/14/18 79    Physical Exam Vitals signs and nursing note reviewed.  Constitutional:      Appearance: Normal appearance. She is well-developed and well-groomed.  HENT:     Head: Normocephalic and atraumatic.     Nose: Nose normal.     Mouth/Throat:     Mouth: Mucous membranes are moist.     Pharynx: Oropharynx is clear.  Eyes:      Conjunctiva/sclera: Conjunctivae normal.     Pupils: Pupils are equal, round, and reactive to light.  Cardiovascular:     Rate and Rhythm: Normal rate and regular rhythm.     Heart sounds: Normal heart sounds. No murmur.  Pulmonary:     Effort: Pulmonary effort is normal.     Breath sounds: Normal breath sounds.  Skin:    General: Skin is warm and dry.  Neurological:     General: No focal deficit present.     Mental Status: She is alert and oriented to person, place, and time. Mental status is at baseline.     Gait: Gait normal.  Psychiatric:        Attention and Perception: Attention and perception normal.        Mood and Affect: Mood and affect normal.        Speech: Speech normal.  Behavior: Behavior normal. Behavior is cooperative.        Thought Content: Thought content normal.        Cognition and Memory: Cognition and memory normal.        Judgment: Judgment normal.     Assessment   1.anxiety and depression improved  2. Attention issues  3. HM Plan  1. Cont meds same  Doses for now  2. Refer psychology for testing add/adhd  3.  Flu shot had 07/2018 work Tdap pt thinks had 2014/2015  Had hpv vaccine age 33 y.o   Hep B, MMR immune  rec D3 2000 Iu daily  Declines sTD check  Recheck urine today  Pap had 06/13/17 neg not hpv tested. F/u 11/2018 Kyleena IUD due to be replaced 02/22/21 My Myriad and BRCA neg mom died 14 y.o died 70 y.o  H/o Bx neck [place] saw 1 year ago bx normal   Provider: Dr. Olivia Mackie McLean-Scocuzza-[facility]

## 2018-11-10 NOTE — Addendum Note (Signed)
Addended by: Leeanne Rio on: 11/10/2018 04:54 PM   Modules accepted: Orders

## 2018-11-11 LAB — URINALYSIS, ROUTINE W REFLEX MICROSCOPIC
Bilirubin, UA: NEGATIVE
GLUCOSE, UA: NEGATIVE
Ketones, UA: NEGATIVE
Leukocytes, UA: NEGATIVE
Nitrite, UA: NEGATIVE
Protein, UA: NEGATIVE
RBC, UA: NEGATIVE
Specific Gravity, UA: 1.022 (ref 1.005–1.030)
Urobilinogen, Ur: 0.2 mg/dL (ref 0.2–1.0)
pH, UA: 6 (ref 5.0–7.5)

## 2018-11-11 LAB — SPECIMEN STATUS REPORT

## 2018-11-22 ENCOUNTER — Other Ambulatory Visit: Payer: Self-pay | Admitting: Internal Medicine

## 2018-11-22 DIAGNOSIS — F32A Depression, unspecified: Secondary | ICD-10-CM

## 2018-11-22 DIAGNOSIS — F419 Anxiety disorder, unspecified: Principal | ICD-10-CM

## 2018-11-22 DIAGNOSIS — F329 Major depressive disorder, single episode, unspecified: Secondary | ICD-10-CM

## 2018-11-22 MED ORDER — BUPROPION HCL 75 MG PO TABS: 37.5000 mg | ORAL_TABLET | Freq: Two times a day (BID) | ORAL | 3 refills | Status: DC

## 2018-12-16 ENCOUNTER — Other Ambulatory Visit: Payer: Self-pay

## 2018-12-16 ENCOUNTER — Ambulatory Visit (INDEPENDENT_AMBULATORY_CARE_PROVIDER_SITE_OTHER): Payer: BC Managed Care – PPO | Admitting: Obstetrics and Gynecology

## 2018-12-16 ENCOUNTER — Encounter: Payer: Self-pay | Admitting: Obstetrics and Gynecology

## 2018-12-16 VITALS — BP 122/74 | Ht 64.0 in | Wt 130.0 lb

## 2018-12-16 DIAGNOSIS — Z803 Family history of malignant neoplasm of breast: Secondary | ICD-10-CM

## 2018-12-16 NOTE — Progress Notes (Signed)
Obstetrics & Gynecology Office Visit   Chief Complaint  Patient presents with  . Follow-up  Breast cancer surveillance  History of Present Illness: 33 y.o. G54P2002 female who presents for a breast exam that is routine.  She has a family history of breast cancer and has tested negative for significant genetic mutations.  She has no complaints today.  She does do occasional breast exams at home with no concerning findings today.  Past Medical History:  Diagnosis Date  . Allergy   . Anxiety   . Bone tumor (benign)    tumor vs cyst removed btwn age [age] y.o.   . BRCA negative 03/2016   MyRisk neg except NBN VUS  . Chicken pox   . Complication of anesthesia   . Family history of breast cancer 03/2016   IBIS=18.4%  . PONV (postoperative nausea and vomiting)     Past Surgical History:  Procedure Laterality Date  . BONE CYST EXCISION     right foot.    Gynecologic History: No LMP recorded. (Menstrual status: IUD).  Obstetric History: [id number]  Family History  Problem Relation Age of Onset  . Cancer Mother 57       breast / died at 35  . Hypertension Mother   . Depression Mother   . Arthritis Father   . Depression Father        committed suicide   . Hypertension Father   . Hypertension Sister   . Heart disease Sister        heart murmur  . Birth defects Sister   . Depression Sister   . Arthritis Maternal Grandmother   . Other Maternal Grandmother        elevated tau protein   . Cancer Paternal Grandmother        lung  . Emphysema Paternal Grandfather     Social History   Socioeconomic History  . Marital status: Single    Spouse name: Not on file  . Number of children: Not on file  . Years of education: Not on file  . Highest education level: Not on file  Occupational History  . Not on file  Social Needs  . Financial resource strain: Not on file  . Food insecurity:    Worry: Not on file    Inability: Not on file  . Transportation needs:    Medical: Not  on file    Non-medical: Not on file  Tobacco Use  . Smoking status: Never Smoker  . Smokeless tobacco: Never Used  Substance and Sexual Activity  . Alcohol use: Yes    Comment: social  . Drug use: No  . Sexual activity: Yes    Partners: Male    Birth control/protection: I.U.D.  Lifestyle  . Physical activity:    Days per week: Not on file    Minutes per session: Not on file  . Stress: Not on file  Relationships  . Social connections:    Talks on phone: Not on file    Gets together: Not on file    Attends religious service: Not on file    Active member of club or organization: Not on file    Attends meetings of clubs or organizations: Not on file    Relationship status: Not on file  . Intimate partner violence:    Fear of current or ex partner: Not on file    Emotionally abused: Not on file    Physically abused: Not on file    Forced sexual  activity: Not on file  Other Topics Concern  . 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 Cefadroxil Nausea And Vomiting  . Fire Dynegy     anaphylaxis  . Septra [Sulfamethoxazole-Trimethoprim] Nausea And Vomiting  . Zoloft [Sertraline Hcl]     Abnormal dreams      Prior to Admission medications   Medication Sig Start Date End Date Taking? Authorizing Provider  buPROPion (WELLBUTRIN) 75 MG tablet Take 0.5 tablets (37.5 mg total) by mouth 2 (two) times daily. PLEASE CUT PILLS IN 1/2 11/22/18  Yes McLean-Scocuzza, Nino Glow, MD  busPIRone (BUSPAR) 5 MG tablet Take 1 tablet (5 mg total) by mouth 2 (two) times daily. 11/10/18  Yes McLean-Scocuzza, Nino Glow, MD  EPINEPHrine (EPIPEN 2-PAK) 0.3 mg/0.3 mL IJ SOAJ injection Inject into the muscle once.   Yes [provider]  Levonorgestrel (KYLEENA) 19.5 MG IUD by Intrauterine route.   Yes [provider]    Review of Systems  Constitutional: Negative.   HENT: Negative.   Eyes: Negative.    Respiratory: Negative.   Cardiovascular: Negative.   Gastrointestinal: Negative.   Genitourinary: Negative.   Musculoskeletal: Negative.   Skin: Negative.   Neurological: Negative.   Psychiatric/Behavioral: Negative.      Physical Exam BP 122/74   Ht '5\' 4"'  (1.626 m)   Wt 130 lb (59 kg)   BMI 22.31 kg/m  No LMP recorded. (Menstrual status: IUD). Physical Exam Constitutional:      General: She is not in acute distress.    Appearance: Normal appearance.  HENT:     Head: Normocephalic and atraumatic.  Eyes:     General: No scleral icterus.    Conjunctiva/sclera: Conjunctivae normal.  Chest:     Breasts:        Right: Normal. No swelling, bleeding, inverted nipple, mass, nipple discharge, skin change or tenderness.        Left: Normal. No swelling, bleeding, inverted nipple, mass, nipple discharge, skin change or tenderness.  Neurological:     General: No focal deficit present.     Mental Status: She is alert and oriented to person, place, and time.     Cranial Nerves: No cranial nerve deficit.  Psychiatric:        Mood and Affect: Mood normal.        Behavior: Behavior normal.        Judgment: Judgment normal.  Exam conducted with a chaperone present.    Female chaperone present for pelvic and breast  portions of the physical exam  Assessment: 33 y.o. G91P1002 female here for  1. Family history of breast cancer      Plan: Problem List Items Addressed This Visit      Other   Family history of breast cancer - Primary     Follow up in six months for routine annual and ongoing breast cancer surveillance.   Prentice Docker, MD 12/16/2018 9:42 AM

## 2019-03-29 ENCOUNTER — Ambulatory Visit (INDEPENDENT_AMBULATORY_CARE_PROVIDER_SITE_OTHER): Payer: BC Managed Care – PPO | Admitting: Psychology

## 2019-03-29 DIAGNOSIS — F411 Generalized anxiety disorder: Secondary | ICD-10-CM

## 2019-04-20 ENCOUNTER — Ambulatory Visit (INDEPENDENT_AMBULATORY_CARE_PROVIDER_SITE_OTHER): Payer: BC Managed Care – PPO | Admitting: Psychology

## 2019-04-20 DIAGNOSIS — F411 Generalized anxiety disorder: Secondary | ICD-10-CM | POA: Diagnosis not present

## 2019-04-20 DIAGNOSIS — F909 Attention-deficit hyperactivity disorder, unspecified type: Secondary | ICD-10-CM | POA: Diagnosis not present

## 2019-05-06 ENCOUNTER — Ambulatory Visit (INDEPENDENT_AMBULATORY_CARE_PROVIDER_SITE_OTHER): Payer: BC Managed Care – PPO | Admitting: Psychology

## 2019-05-06 DIAGNOSIS — F4010 Social phobia, unspecified: Secondary | ICD-10-CM | POA: Diagnosis not present

## 2019-05-06 DIAGNOSIS — F411 Generalized anxiety disorder: Secondary | ICD-10-CM | POA: Diagnosis not present

## 2019-05-11 ENCOUNTER — Other Ambulatory Visit: Payer: Self-pay

## 2019-05-11 ENCOUNTER — Ambulatory Visit (INDEPENDENT_AMBULATORY_CARE_PROVIDER_SITE_OTHER): Payer: BC Managed Care – PPO | Admitting: Internal Medicine

## 2019-05-11 ENCOUNTER — Encounter: Payer: Self-pay | Admitting: Internal Medicine

## 2019-05-11 VITALS — Ht 63.0 in | Wt 140.0 lb

## 2019-05-11 DIAGNOSIS — F32A Depression, unspecified: Secondary | ICD-10-CM

## 2019-05-11 DIAGNOSIS — F419 Anxiety disorder, unspecified: Secondary | ICD-10-CM

## 2019-05-11 DIAGNOSIS — F411 Generalized anxiety disorder: Secondary | ICD-10-CM

## 2019-05-11 DIAGNOSIS — F329 Major depressive disorder, single episode, unspecified: Secondary | ICD-10-CM

## 2019-05-11 DIAGNOSIS — F4010 Social phobia, unspecified: Secondary | ICD-10-CM | POA: Diagnosis not present

## 2019-05-11 DIAGNOSIS — F429 Obsessive-compulsive disorder, unspecified: Secondary | ICD-10-CM

## 2019-05-11 MED ORDER — ALPRAZOLAM 0.25 MG PO TABS: 0.1250 mg | ORAL_TABLET | Freq: Every day | ORAL | 2 refills | Status: DC | PRN

## 2019-05-11 MED ORDER — BUSPIRONE HCL 10 MG PO TABS: 10.0000 mg | ORAL_TABLET | Freq: Two times a day (BID) | ORAL | 3 refills | Status: DC

## 2019-05-11 NOTE — Progress Notes (Addendum)
Telephone Note  I connected with Chelsea Duncan   on 05/11/19 at  3:15 PM EDT by a telephone and verified that I am speaking with the correct person using two identifiers.  Location patient: home Location provider:work or home office Persons participating in the virtual visit: patient, provider  I discussed the limitations of evaluation and management by telemedicine and the availability of in person appointments. The patient expressed understanding and agreed to proceed.   HPI:  Therapy appt revealed GAD/SAD no ADD/ADHD she does have OCD/frustration/verbal aggression triggered by only the work setting   She was on Wellbutrin 37.5 mg bid and buspar 5 mg bid but this made her feel scattered brain/spaced out and she stopped both and resumed buspar 5 mg bid 1.5 to 2 weeks ago   In the past she think Zoloft made her hallucinate after her mom died and celexa caused fatigue   Reviewed other options for SAD effexor, paxil 10, propranolol 10 mg prn, she declines gabapentin. For now we will try benzo   ROS: See pertinent positives and negatives per HPI.  Past Medical History:  Diagnosis Date  . Allergy   . Anxiety   . Bone tumor (benign)    tumor vs cyst removed btwn age [age] y.o.   . BRCA negative 03/2016   MyRisk neg except NBN VUS  . Chicken pox   . Complication of anesthesia   . Family history of breast cancer 03/2016   IBIS=18.4%  . PONV (postoperative nausea and vomiting)     Past Surgical History:  Procedure Laterality Date  . BONE CYST EXCISION     right foot.    Family History  Problem Relation Age of Onset  . Cancer Mother 25       breast / died at 80  . Hypertension Mother   . Depression Mother   . Arthritis Father   . Depression Father        committed suicide   . Hypertension Father   . Hypertension Sister   . Heart disease Sister        heart murmur  . Birth defects Sister   . Depression Sister   . Arthritis Maternal Grandmother   . Other Maternal  Grandmother        elevated tau protein   . Cancer Paternal Grandmother        lung  . Emphysema Paternal Grandfather     SOCIAL HX: married with kids    Current Outpatient Medications:  .  ALPRAZolam (XANAX) 0.25 MG tablet, Take 0.5 tablets (0.125 mg total) by mouth daily as needed for anxiety., Disp: 30 tablet, Rfl: 2 .  buPROPion (WELLBUTRIN) 75 MG tablet, Take 0.5 tablets (37.5 mg total) by mouth 2 (two) times daily. PLEASE CUT PILLS IN 1/2, Disp: 90 tablet, Rfl: 3 .  busPIRone (BUSPAR) 10 MG tablet, Take 1 tablet (10 mg total) by mouth 2 (two) times daily., Disp: 180 tablet, Rfl: 3 .  EPINEPHrine (EPIPEN 2-PAK) 0.3 mg/0.3 mL IJ SOAJ injection, Inject into the muscle once., Disp: , Rfl:  .  Levonorgestrel (KYLEENA) 19.5 MG IUD, by Intrauterine route., Disp: , Rfl:   EXAM:  VITALS per patient if applicable:  GENERAL: alert, oriented, appears well and in no acute distress  HEENT: atraumatic, conjunttiva clear, no obvious abnormalities on inspection of external nose and ears  NECK: normal movements of the head and neck  LUNGS: on inspection no signs of respiratory distress, breathing rate appears normal, no obvious gross  SOB, gasping or wheezing  CV: no obvious cyanosis  MS: moves all visible extremities without noticeable abnormality  PSYCH/NEURO: pleasant and cooperative, no obvious depression or anxiety, speech and thought processing grossly intact  ASSESSMENT AND PLAN:  Discussed the following assessment and plan:  GAD (generalized anxiety disorder)/social anxiety related to work/ock- Plan: busPIRone (BUSPAR) 10 MG tablet bid increased from 5 mg bid, ALPRAZolam (XANAX) 0.25 MG tablet 1/2 to 1 pill qd prn  F/u in 1 month  Consider paxil 10 mg or effexor low dose or propranolol 10 mg prn pt declines gabapentin In past did not like the way zoloft made her feel and celexa caused fatigue  Reviewed [facility] records psychology 05/05/19 results Dr. Glennon Hamilton:  -low level  inattention and high level of hyperactivity-impulsivity no ADHD noted, +GAD, SAD+ esp with perfomannce. rec cbt and mindfulness   HM-physical due 08/2019  Flu shot had 07/2018 work Tdap pt thinks had 2014/2015  Had hpv vaccine age 46 y.o   Hep B, MMR immune  rec D3 2000 Iu daily  Declines sTD check  Recheck urine today  Pap had 06/13/17 neg not hpv tested.F/u 11/2018 Kyleena IUD due to be replaced 02/22/21 My Myriad and BRCA neg mom died 42 y.o died 81 y.o  H/o Bx neck [place] saw 1 year ago bx normal    I discussed the assessment and treatment plan with the patient. The patient was provided an opportunity to ask questions and all were answered. The patient agreed with the plan and demonstrated an understanding of the instructions.   The patient was advised to call back or seek an in-person evaluation if the symptoms worsen or if the condition fails to improve as anticipated.  Time spent 15 minutes  [name], MD

## 2019-05-19 ENCOUNTER — Encounter: Payer: Self-pay | Admitting: Internal Medicine

## 2019-06-01 ENCOUNTER — Other Ambulatory Visit: Payer: Self-pay

## 2019-06-01 DIAGNOSIS — Z20822 Contact with and (suspected) exposure to covid-19: Secondary | ICD-10-CM

## 2019-06-01 NOTE — Progress Notes (Unsigned)
lab

## 2019-06-02 LAB — NOVEL CORONAVIRUS, NAA: SARS-CoV-2, NAA: NOT DETECTED

## 2019-06-10 ENCOUNTER — Other Ambulatory Visit: Payer: Self-pay

## 2019-06-10 ENCOUNTER — Ambulatory Visit (INDEPENDENT_AMBULATORY_CARE_PROVIDER_SITE_OTHER): Payer: BC Managed Care – PPO | Admitting: Internal Medicine

## 2019-06-10 DIAGNOSIS — F411 Generalized anxiety disorder: Secondary | ICD-10-CM

## 2019-06-10 DIAGNOSIS — F4010 Social phobia, unspecified: Secondary | ICD-10-CM | POA: Diagnosis not present

## 2019-06-10 DIAGNOSIS — F429 Obsessive-compulsive disorder, unspecified: Secondary | ICD-10-CM | POA: Diagnosis not present

## 2019-06-10 NOTE — Progress Notes (Signed)
Virtual Visit via Video Note  I connected with Chelsea Duncan   on 06/11/19 at  4:00 PM EDT by a video enabled telemedicine application and verified that I am speaking with the correct person using two identifiers.  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 The patient expressed understanding and agreed to proceed.   HPI: Social anxiety which is mostly due to work doing well on Buspar 10 mg but only taking 1x per day not bid does not feel like she needs it at home but takes the edge of anxiety off at work. 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  Past Medical History:  Diagnosis Date  . Allergy   . Anxiety   . Bone tumor (benign)    tumor vs cyst removed btwn age [age] y.o.   . BRCA negative 03/2016   MyRisk neg except NBN VUS  . Chicken pox   . Complication of anesthesia   . Family history of breast cancer 03/2016   IBIS=18.4%  . PONV (postoperative nausea and vomiting)     Past Surgical History:  Procedure Laterality Date  . BONE CYST EXCISION     right foot.    Family History  Problem Relation Age of Onset  . Cancer Mother 72       breast / died at 48 in December 23, 2009  . Hypertension Mother   . Depression Mother   . Arthritis Father   . Depression Father        committed suicide in 12-23-2014  . Hypertension Father   . Hypertension Sister   . Heart disease Sister        heart murmur  . Birth defects Sister   .  Depression Sister   . Arthritis Maternal Grandmother   . Other Maternal Grandmother        elevated tau protein   . Cancer Paternal Grandmother        lung  . 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  ALPRAZolam (XANAX) 0.25 MG tablet, Take 0.5 tablets (0.125 mg total) by mouth daily as needed for anxiety., Disp: 30 tablet, Rfl: 2 .  busPIRone (BUSPAR) 10 MG tablet, Take 1 tablet (10 mg total) by mouth 2 (two) times daily., Disp: 180 tablet, Rfl: 3 .  EPINEPHrine (EPIPEN 2-PAK) 0.3 mg/0.3 mL IJ SOAJ injection, Inject into the muscle once., Disp: , Rfl:  .  Levonorgestrel (KYLEENA) 19.5 MG IUD, by Intrauterine route., Disp: , Rfl:   EXAM:  VITALS per patient if applicable:  GENERAL: alert, oriented, appears well and in no acute distress  HEENT: atraumatic, conjunttiva clear, no obvious abnormalities on inspection of external nose and ears  NECK: normal movements  of the head and neck  LUNGS: on inspection no signs of respiratory distress, breathing rate appears normal, no obvious gross SOB, gasping or wheezing  CV: no obvious cyanosis  MS: moves all visible extremities without noticeable abnormality  PSYCH/NEURO: pleasant and cooperative, no obvious depression or anxiety, speech and thought processing grossly intact  ASSESSMENT AND PLAN:  Discussed the following assessment and plan:  Social anxiety disorder  GAD (generalized anxiety disorder)  Obsessive-compulsive disorder, unspecified type -cont same meds for now Buspar rec take 10 mg bid and prn xanax 0.25 1/2 pill prn will try  -prev disc paxil, propranolol vs effexor will not changes meds for now doing ok   HM-physical due f/u 10/2019 needs pm appt  Flu shot had 07/2018 work Tdap pt thinks had 2014/2015  Had hpv vaccine age 3 y.o   Hep B, MMRimmune   rec D3 2000 Iu daily Declines sTD check  Labs had 10/27/18  Pap had 06/13/17 neg not hpv tested.F/u 11/2018 Kyleena IUD due to be replaced 02/22/21 -she f/u with ob/gyn Dr. Prentice Docker   My Myriad and BRCA neg mom died 84 y.o died 84 y.o  H/o Bx neck [place] saw 1 year ago bx normal    I discussed the assessment and treatment plan with the patient. The patient was provided an opportunity to ask questions and all were answered. The patient agreed with the plan and demonstrated an understanding of the instructions.   The patient was advised to call back or seek an in-person evaluation if the symptoms worsen or if the condition fails to improve as anticipated.  Time spent 15 minutes  [name], MD

## 2019-06-11 NOTE — Progress Notes (Signed)
Pt not in [employer]

## 2019-06-21 ENCOUNTER — Other Ambulatory Visit (HOSPITAL_COMMUNITY)
Admission: RE | Admit: 2019-06-21 | Discharge: 2019-06-21 | Disposition: A | Discharge status: Home / Self Care | Payer: BC Managed Care – PPO | Source: Ambulatory Visit | Attending: Obstetrics and Gynecology | Admitting: Obstetrics and Gynecology

## 2019-06-21 ENCOUNTER — Ambulatory Visit (INDEPENDENT_AMBULATORY_CARE_PROVIDER_SITE_OTHER): Payer: BC Managed Care – PPO | Admitting: Obstetrics and Gynecology

## 2019-06-21 ENCOUNTER — Other Ambulatory Visit: Payer: Self-pay

## 2019-06-21 VITALS — BP 114/70 | Ht 64.0 in | Wt 116.0 lb

## 2019-06-21 DIAGNOSIS — Z124 Encounter for screening for malignant neoplasm of cervix: Secondary | ICD-10-CM

## 2019-06-21 DIAGNOSIS — Z01419 Encounter for gynecological examination (general) (routine) without abnormal findings: Secondary | ICD-10-CM | POA: Insufficient documentation

## 2019-06-21 DIAGNOSIS — Z1331 Encounter for screening for depression: Secondary | ICD-10-CM

## 2019-06-21 DIAGNOSIS — Z1339 Encounter for screening examination for other mental health and behavioral disorders: Secondary | ICD-10-CM

## 2019-06-21 NOTE — Progress Notes (Signed)
Gynecology Annual Exam  PCP: McLean-Scocuzza, Nino Glow, MD  Chief Complaint  Patient presents with  . Annual Exam   History of Present Illness:  Chelsea Duncan is a 33 y.o. G2P1002 who LMP was Patient's last menstrual period was 05/24/2019., presents today for her annual examination.  Her menses are very light. She has spotting once a month.   She is sexually active with a single partner, contraception - IUD  Verdia Kuba).  Last Pap: 06/11/2017  Results were: no abnormalities /neg HPV DNA not done due to age. Hx of STDs: none  There is a FH of breast cancer.  She has tested negative for BRCA mutations (Myriad). There is no FH of ovarian cancer. The patient does do self-breast exams. Her IBIS Lovell Sheehan) is 18.4%. She has been monitored with twice yearly breast exams.   Tobacco use: The patient denies current or previous tobacco use. Alcohol use: social drinker Exercise: not active  The patient wears seatbelts: yes.   The patient reports that domestic violence in her life is absent.   Past Medical History:  Diagnosis Date  . Allergy   . Anxiety   . 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   . BRCA negative 03/2016   MyRisk neg except NBN VUS  . Chicken pox   . Complication of anesthesia   . Family history of breast cancer 03/2016   IBIS=18.4%  . PONV (postoperative nausea and vomiting)    Past Surgical History:  Procedure Laterality Date  . BONE CYST EXCISION     right foot.   Prior to Admission medications   Medication Sig Start Date End Date Taking? Authorizing Provider  ALPRAZolam Duanne Moron) 0.25 MG tablet Take 0.5 tablets (0.125 mg total) by mouth daily as needed for anxiety. 05/11/19  Yes McLean-Scocuzza, Nino Glow, MD  busPIRone (BUSPAR) 10 MG tablet Take 1 tablet (10 mg total) by mouth 2 (two) times daily. 05/11/19  Yes McLean-Scocuzza, Nino Glow, MD  EPINEPHrine (EPIPEN 2-PAK) 0.3 mg/0.3 mL IJ SOAJ injection Inject into the muscle once.   Yes [provider]  Levonorgestrel (KYLEENA) 19.5 MG IUD by Intrauterine route.   Yes [provider]    Allergies  Allergen Reactions  . Cefadroxil Nausea And Vomiting  . [employer]     anaphylaxis  . Septra [Sulfamethoxazole-Trimethoprim] Nausea And Vomiting  . Zoloft [Sertraline Hcl]     Abnormal dreams      Obstetric History: [id number]  Social History   Socioeconomic History  . Marital status: Single    Spouse name: Not on file  . Number of children: Not on file  . Years of education: Not on file  . Highest education level: Not on file  Occupational History  . Not on file  Social Needs  . Financial resource strain: Not on file  . Food insecurity    Worry: Not on file    Inability: Not on file  . Transportation needs    Medical: Not on file    Non-medical: Not on file  Tobacco Use  . Smoking status: Never Smoker  . Smokeless tobacco: Never Used  Substance and Sexual Activity  . Alcohol use: Yes    Comment: social  . Drug use: No  . Sexual activity: Yes    Partners: Male    Birth control/protection: I.U.D.  Lifestyle  . Physical activity    Days per week: Not on file    Minutes per session:  Not on file  . Stress: Not on file  Relationships  . Social Herbalist on phone: Not on file    Gets together: Not on file    Attends religious service: Not on file    Active member of club or organization: Not on file    Attends meetings of clubs or organizations: Not on file    Relationship status: Not on file  . Intimate partner violence    Fear of current or ex partner: Not on file    Emotionally abused: Not on file    Physically abused: Not on file    Forced sexual activity: Not on file  Other Topics Concern  . 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 Cancer Mother 8        breast / died at 1 in 29-Dec-2009  . Hypertension Mother   . Depression Mother   . Arthritis Father   . Depression Father        committed suicide in 12/29/2014  . Hypertension Father   . Hypertension Sister   . Heart disease Sister        heart murmur  . Birth defects Sister   . Depression Sister   . Arthritis Maternal Grandmother   . Other Maternal Grandmother        elevated tau protein   . Cancer Paternal Grandmother        lung  . Emphysema Paternal Grandfather     Review of Systems  Constitutional: Negative.   HENT: Negative.   Eyes: Negative.   Respiratory: Negative.   Cardiovascular: Negative.   Gastrointestinal: Negative.   Genitourinary: Negative.   Musculoskeletal: Negative.   Skin: Negative.   Neurological: Negative.   Psychiatric/Behavioral: Negative.      Physical Exam BP 114/70   Ht '5\' 4"'  (1.626 m)   Wt 116 lb (52.6 kg)   LMP 05/24/2019   BMI 19.91 kg/m    Physical Exam Constitutional:      General: She is not in acute distress.    Appearance: Normal appearance. She is well-developed.  Genitourinary:     Pelvic exam was performed with patient supine.     Vulva, urethra, bladder and uterus normal.     No inguinal adenopathy present in the right or left side.    No signs of injury in the vagina.     No vaginal discharge, erythema, tenderness or bleeding.     No cervical motion tenderness, discharge, lesion or polyp.     Uterus is mobile.     Uterus is not enlarged or tender.     No uterine mass detected.    Uterus is anteverted.     No right or left adnexal mass present.     Right adnexa not tender or full.     Left adnexa not tender or full.  HENT:     Head: Normocephalic and atraumatic.  Eyes:     General: No scleral icterus.    Conjunctiva/sclera: Conjunctivae normal.  Neck:     Musculoskeletal: Normal range of motion and neck supple.     Thyroid: No thyromegaly.  Cardiovascular:     Rate and Rhythm: Normal rate and regular rhythm.  Heart  sounds: No murmur. No friction rub. No gallop.   Pulmonary:     Effort: Pulmonary effort is normal. No respiratory distress.     Breath sounds: Normal breath sounds. No wheezing or rales.  Chest:     Breasts:        Right: No inverted nipple, mass, nipple discharge, skin change or tenderness.        Left: No inverted nipple, mass, nipple discharge, skin change or tenderness.  Abdominal:     General: Bowel sounds are normal. There is no distension.     Palpations: Abdomen is soft. There is no mass.     Tenderness: There is no abdominal tenderness. There is no guarding or rebound.  Musculoskeletal: Normal range of motion.        General: No swelling or tenderness.  Lymphadenopathy:     Cervical: No cervical adenopathy.     Lower Body: No right inguinal adenopathy. No left inguinal adenopathy.  Neurological:     General: No focal deficit present.     Mental Status: She is alert and oriented to person, place, and time.     Cranial Nerves: No cranial nerve deficit.  Skin:    General: Skin is warm and dry.     Findings: No erythema or rash.  Psychiatric:        Mood and Affect: Mood normal.        Behavior: Behavior normal.        Judgment: Judgment normal.    Female chaperone present for pelvic and breast  portions of the physical exam  Results: AUDIT Questionnaire (screen for alcoholism): 3 PHQ-9: 4  Assessment: 33 y.o. G38P1002 female here for routine annual gynecologic examination  Plan: Problem List Items Addressed This Visit    None    Visit Diagnoses    Women's annual routine gynecological examination    -  Primary   Relevant Orders   Cytology - PAP   Screening for depression       Screening for alcoholism       Pap smear for cervical cancer screening       Relevant Orders   Cytology - PAP      Screening: -- Blood pressure screen normal -- Weight screening: normal -- Depression screening negative (PHQ-9) -- Nutrition: normal -- cholesterol screening: not due  for screening -- osteoporosis screening: not due -- tobacco screening: not using -- alcohol screening: AUDIT questionnaire indicates low-risk usage. -- family history of breast cancer screening: done. not at high risk. -- no evidence of domestic violence or intimate partner violence. -- STD screening: gonorrhea/chlamydia NAAT not collected per patient request. -- pap smear collected per ASCCP guidelines -- flu vaccine: Will get at a later date   Prentice Docker, MD 06/22/2019 8:10 AM

## 2019-06-22 ENCOUNTER — Encounter: Payer: Self-pay | Admitting: Obstetrics and Gynecology

## 2019-06-23 LAB — CYTOLOGY - PAP
Diagnosis: NEGATIVE
HPV: NOT DETECTED

## 2019-09-09 ENCOUNTER — Encounter: Payer: Self-pay | Admitting: Internal Medicine

## 2019-09-09 ENCOUNTER — Other Ambulatory Visit: Payer: BC Managed Care – PPO

## 2019-09-09 ENCOUNTER — Other Ambulatory Visit: Payer: Self-pay

## 2019-09-09 ENCOUNTER — Ambulatory Visit (INDEPENDENT_AMBULATORY_CARE_PROVIDER_SITE_OTHER): Payer: BC Managed Care – PPO | Admitting: Internal Medicine

## 2019-09-09 VITALS — Ht 64.0 in | Wt 114.0 lb

## 2019-09-09 DIAGNOSIS — N3000 Acute cystitis without hematuria: Secondary | ICD-10-CM

## 2019-09-09 MED ORDER — FLUCONAZOLE 150 MG PO TABS: 150.0000 mg | ORAL_TABLET | Freq: Once | ORAL | 0 refills | Status: AC

## 2019-09-09 MED ORDER — CIPROFLOXACIN HCL 500 MG PO TABS: 500.0000 mg | ORAL_TABLET | Freq: Two times a day (BID) | ORAL | 0 refills | Status: DC

## 2019-09-09 NOTE — Patient Instructions (Signed)

## 2019-09-09 NOTE — Progress Notes (Signed)
Virtual Visit via Video Note  I connected with Chelsea Duncan   on 09/09/19 at 11:30 AM EST by a video enabled telemedicine application and verified that I am speaking with the correct person using two identifiers.  Location patient: work Environmental manager or home office Persons participating in the virtual visit: patient, provider  I discussed the limitations of evaluation and management by telemedicine and the availability of in person appointments. The patient expressed understanding and agreed to proceed.   HPI: 1. Since 09/04/19 c/o increased urinary freq, hesistancy, urgency, some burning last time she waited to long with UTI only had 1 x before and had a kidney infection. No fever. She tried AZO otc w/o relief and called for appt 3 days ago    ROS: See pertinent positives and negatives per HPI.  Past Medical History:  Diagnosis Date  . Allergy   . Anxiety   . 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   . BRCA negative 03/2016   MyRisk neg except NBN VUS  . Chicken pox   . Complication of anesthesia   . Family history of breast cancer 03/2016   IBIS=18.4%  . PONV (postoperative nausea and vomiting)   . Pyelonephritis   . UTI (urinary tract infection)     Past Surgical History:  Procedure Laterality Date  . BONE CYST EXCISION     right foot.    Family History  Problem Relation Age of Onset  . Cancer Mother 23       breast / died at 45 in 12-13-2009  . Hypertension Mother   . Depression Mother   . Arthritis Father   . Depression Father        committed suicide in 12/13/14  . Hypertension Father   . Hypertension Sister   . Heart disease Sister        heart murmur  . Birth defects Sister   . Depression Sister   . Arthritis Maternal Grandmother   . Other Maternal Grandmother        elevated tau protein   . Cancer Paternal Grandmother        lung  . 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  ALPRAZolam (XANAX) 0.25 MG tablet, Take 0.5 tablets (0.125 mg total) by mouth daily as needed for anxiety., Disp: 30 tablet, Rfl: 2 .  busPIRone (BUSPAR) 10 MG tablet, Take 1 tablet (10 mg total) by mouth 2 (two) times daily., Disp: 180 tablet, Rfl: 3 .  EPINEPHrine (EPIPEN 2-PAK) 0.3 mg/0.3 mL IJ SOAJ injection, Inject into the muscle once., Disp: , Rfl:  .  Levonorgestrel (KYLEENA) 19.5 MG IUD, by Intrauterine route., Disp: , Rfl:  .  ciprofloxacin (CIPRO) 500 MG tablet, Take 1 tablet (500 mg total) by mouth 2 (two) times daily. With food, Disp: 14 tablet, Rfl: 0 .  fluconazole (DIFLUCAN) 150 MG tablet, Take 1 tablet (150 mg total) by mouth once for 1 dose., Disp: 1 tablet, Rfl: 0  EXAM:  VITALS per patient if applicable:  GENERAL: alert, oriented, appears well and in no acute distress  HEENT: atraumatic, conjunttiva clear, no obvious abnormalities on inspection of external nose and ears  NECK: normal movements of the head and neck  LUNGS: on inspection no signs of  respiratory distress, breathing rate appears normal, no obvious gross SOB, gasping or wheezing  CV: no obvious cyanosis  MS: moves all visible extremities without noticeable abnormality  PSYCH/NEURO: pleasant and cooperative, no obvious depression or anxiety, speech and thought processing grossly intact  ASSESSMENT AND PLAN:  Discussed the following assessment and plan:  Acute cystitis without hematuria - Plan: Urinalysis, Routine w reflex microscopic, Urine Culture, ciprofloxacin (CIPRO) 500 MG tablet bid x 5-7 days, fluconazole (DIFLUCAN) 150 MG tablet Given ed on prevention of UTI   -we discussed possible serious and likely etiologies, options for evaluation and workup, limitations of telemedicine visit vs in person visit, treatment, treatment risks and precautions. Pt prefers to treat via telemedicine  empirically rather then risking or undertaking an in person visit at this moment. Patient agrees to seek prompt in person care if worsening, new symptoms arise, or if is not improving with treatment.   I discussed the assessment and treatment plan with the patient. The patient was provided an opportunity to ask questions and all were answered. The patient agreed with the plan and demonstrated an understanding of the instructions.   The patient was advised to call back or seek an in-person evaluation if the symptoms worsen or if the condition fails to improve as anticipated.  Time spent 15 minutes  [name], MD

## 2019-09-10 LAB — URINALYSIS, ROUTINE W REFLEX MICROSCOPIC
Bacteria, UA: NONE SEEN /HPF
Bilirubin Urine: NEGATIVE
Glucose, UA: NEGATIVE
Hgb urine dipstick: NEGATIVE
Hyaline Cast: NONE SEEN /LPF
Ketones, ur: NEGATIVE
Nitrite: NEGATIVE
Protein, ur: NEGATIVE
Specific Gravity, Urine: 1.023 (ref 1.001–1.03)
pH: 5.5 (ref 5.0–8.0)

## 2019-09-10 LAB — URINE CULTURE
MICRO NUMBER:: 1160332
Result:: NO GROWTH
SPECIMEN QUALITY:: ADEQUATE

## 2019-10-21 ENCOUNTER — Ambulatory Visit: Payer: BC Managed Care – PPO | Admitting: Obstetrics and Gynecology

## 2019-10-26 ENCOUNTER — Other Ambulatory Visit: Payer: Self-pay

## 2019-10-29 ENCOUNTER — Encounter: Payer: Self-pay | Admitting: Internal Medicine

## 2019-10-29 ENCOUNTER — Ambulatory Visit (INDEPENDENT_AMBULATORY_CARE_PROVIDER_SITE_OTHER): Payer: BC Managed Care – PPO | Admitting: Internal Medicine

## 2019-10-29 ENCOUNTER — Other Ambulatory Visit: Payer: Self-pay

## 2019-10-29 VITALS — BP 106/60 | HR 70 | Temp 98.0°F | Ht 64.0 in | Wt 121.4 lb

## 2019-10-29 DIAGNOSIS — N644 Mastodynia: Secondary | ICD-10-CM

## 2019-10-29 DIAGNOSIS — Z0000 Encounter for general adult medical examination without abnormal findings: Secondary | ICD-10-CM | POA: Diagnosis not present

## 2019-10-29 DIAGNOSIS — F429 Obsessive-compulsive disorder, unspecified: Secondary | ICD-10-CM

## 2019-10-29 DIAGNOSIS — Z1329 Encounter for screening for other suspected endocrine disorder: Secondary | ICD-10-CM | POA: Diagnosis not present

## 2019-10-29 DIAGNOSIS — Z1389 Encounter for screening for other disorder: Secondary | ICD-10-CM

## 2019-10-29 DIAGNOSIS — Z803 Family history of malignant neoplasm of breast: Secondary | ICD-10-CM

## 2019-10-29 DIAGNOSIS — F411 Generalized anxiety disorder: Secondary | ICD-10-CM

## 2019-10-29 DIAGNOSIS — F4010 Social phobia, unspecified: Secondary | ICD-10-CM

## 2019-10-29 LAB — POCT URINE PREGNANCY: Preg Test, Ur: NEGATIVE

## 2019-10-29 MED ORDER — BUSPIRONE HCL 10 MG PO TABS: ORAL_TABLET | ORAL | 3 refills | Status: DC

## 2019-10-29 NOTE — Patient Instructions (Addendum)
covid exposure For cough mucinex DM green label  Vitamin C 1000 mg daily  Vitamin D3 4000 IU daily  Zinc 100 mg daily  Quercetin 250-500 mg 2x per day   Take a multivitamin daily but above just in case   Breast Tenderness Breast tenderness is a common problem for women of all ages, but may also occur in men. Breast tenderness may range from mild discomfort to severe pain. In women, the pain usually comes and goes with the menstrual cycle, but it can also be constant. Breast tenderness has many possible causes, including hormone changes, infections, and taking certain medicines. You may have tests, such as a mammogram or an ultrasound, to check for any unusual findings. Having breast tenderness usually does not mean that you have breast cancer. Follow these instructions at home: Managing pain and discomfort   If directed, put ice to the painful area. To do this: ? Put ice in a plastic bag. ? Place a towel between your skin and the bag. ? Leave the ice on for 20 minutes, 2-3 times a day.  Wear a supportive bra, especially during exercise. You may also want to wear a supportive bra while sleeping if your breasts are very tender. Medicines  Take over-the-counter and prescription medicines only as told by your health care provider. If the cause of your pain is infection, you may be prescribed an antibiotic medicine.  If you were prescribed an antibiotic, take it as told by your health care provider. Do not stop taking the antibiotic even if you start to feel better. Eating and drinking  Your health care provider may recommend that you lessen the amount of fat in your diet. You can do this by: ? Limiting fried foods. ? Cooking foods using methods such as baking, boiling, grilling, and broiling.  Decrease the amount of caffeine in your diet. Instead, drink more water and choose caffeine-free drinks. General instructions   Keep a log of the days and times when your breasts are most  tender.  Ask your health care provider how to do breast exams at home. This will help you notice if you have an unusual growth or lump.  Keep all follow-up visits as told by your health care provider. This is important. Contact a health care provider if:  Any part of your breast is hard, red, and hot to the touch. This may be a sign of infection.  You are a woman and: ? Not breastfeeding and you have fluid, especially blood or pus, coming out of your nipples. ? Have a new or painful lump in your breast that remains after your menstrual period ends.  You have a fever.  Your pain does not improve or it gets worse.  Your pain is interfering with your daily activities. Summary  Breast tenderness may range from mild discomfort to severe pain.  Breast tenderness has many possible causes, including hormone changes, infections, and taking certain medicines.  It can be treated with ice, wearing a supportive bra, and medicines.  Make changes to your diet if told to by your health care provider. This information is not intended to replace advice given to you by your health care provider. Make sure you discuss any questions you have with your health care provider. Document Revised: 02/15/2019 Document Reviewed: 02/15/2019 Elsevier Patient Education  [place].

## 2019-10-29 NOTE — Progress Notes (Signed)
Chief Complaint  Patient presents with  . Annual Exam   Annual  1. Anxiety increased she is taking buspar 10 mg qd and xanax 0.25 only used 2x. She had been on celexa and wellbutrin but this was changed to these new medications  -of note her and husband thinking about having another baby but she is thinking about putting if off until 1 more year Will speak with ob/gyn 12/2019 appt   2. C/o breast ttp right breast and some on left breast R>L. Right breast is also slightly larger than left but when this happened she was pregnant years ago and 1st sx. She took at home pregnancy test which was negative w/in the last 1-2 weeks. Her symptoms have been going on x ~1.5 weeks nothing tried     Review of Systems  Constitutional: Negative for weight loss.  HENT: Negative for hearing loss.   Eyes:       Nl vision   Respiratory: Negative for shortness of breath.   Cardiovascular: Negative for chest pain.  Gastrointestinal: Negative for abdominal pain.  Genitourinary: Negative for dysuria.       No uti sx's   Musculoskeletal: Negative for falls.  Skin: Negative for rash.       Burn to neck from curling iron   Psychiatric/Behavioral: The patient is nervous/anxious.    Past Medical History:  Diagnosis Date  . Allergy   . Anxiety   . Bone tumor (benign)    tumor vs cyst removed btwn age [age] y.o.   . BRCA negative 03/2016   MyRisk neg except NBN VUS  . Chicken pox   . Complication of anesthesia   . Family history of breast cancer 03/2016   IBIS=18.4%  . PONV (postoperative nausea and vomiting)   . Pyelonephritis   . S/P skin biopsy    neck normal   . UTI (urinary tract infection)    Past Surgical History:  Procedure Laterality Date  . BONE CYST EXCISION     right foot.   Family History  Problem Relation Age of Onset  . Cancer Mother 67       breast / died at 66 in Dec 16, 2009  . Hypertension Mother   . Depression Mother   . Arthritis Father   . Depression Father        committed  suicide in 2014/12/16  . Hypertension Father   . Hypertension Sister   . Heart disease Sister        heart murmur  . Birth defects Sister   . Depression Sister   . Arthritis Maternal Grandmother   . Other Maternal Grandmother        elevated tau protein   . Cancer Paternal Grandmother        lung  . Emphysema Paternal Grandfather    Social History   Socioeconomic History  . Marital status: Single    Spouse name: Not on file  . Number of children: Not on file  . Years of education: Not on file  . Highest education level: Not on file  Occupational History  . Not on file  Tobacco Use  . Smoking status: Never Smoker  . Smokeless tobacco: Never Used  Substance and Sexual Activity  . Alcohol use: Yes    Comment: social  . Drug use: No  . Sexual activity: Yes    Partners: Male    Birth control/protection: I.U.D.  Other Topics Concern  . Not on file  Social History Narrative  As[employer]    Married 2 boys    Grew up in [place]    1 older sister 67 years older than her    Social Determinants of Health   Financial Resource Strain:   . Difficulty of Paying Living Expenses: Not on file  Food Insecurity:   . Worried About Charity fundraiser in the Last Year: Not on file  . Ran Out of Food in the Last Year: Not on file  Transportation Needs:   . Lack of Transportation (Medical): Not on file  . Lack of Transportation (Non-Medical): Not on file  Physical Activity:   . Days of Exercise per Week: Not on file  . Minutes of Exercise per Session: Not on file  Stress:   . Feeling of Stress : Not on file  Social Connections:   . Frequency of Communication with Friends and Family: Not on file  . Frequency of Social Gatherings with Friends and Family: Not on file  . Attends Religious Services: Not on file  . Active Member of Clubs or Organizations: Not on file  . Attends Archivist Meetings: Not on file  . Marital Status: Not on file  Intimate  Partner Violence:   . Fear of Current or Ex-Partner: Not on file  . Emotionally Abused: Not on file  . Physically Abused: Not on file  . Sexually Abused: Not on file   Current Meds  Medication Sig  . ALPRAZolam (XANAX) 0.25 MG tablet Take 0.5 tablets (0.125 mg total) by mouth daily as needed for anxiety.  . busPIRone (BUSPAR) 10 MG tablet 10 mg in the am and 5 mg at lunch  . EPINEPHrine (EPIPEN 2-PAK) 0.3 mg/0.3 mL IJ SOAJ injection Inject into the muscle once.  . Levonorgestrel (KYLEENA) 19.5 MG IUD by Intrauterine route.  . [DISCONTINUED] busPIRone (BUSPAR) 10 MG tablet Take 1 tablet (10 mg total) by mouth 2 (two) times daily.  . [DISCONTINUED] ciprofloxacin (CIPRO) 500 MG tablet Take 1 tablet (500 mg total) by mouth 2 (two) times daily. With food   Allergies  Allergen Reactions  . Cefadroxil Nausea And Vomiting  . Fire [employer]     anaphylaxis  . Septra [Sulfamethoxazole-Trimethoprim] Nausea And Vomiting  . Zoloft [Sertraline Hcl]     Abnormal dreams     Recent Results (from the past 2160 hour(s))  Urine Culture     Status: None   Collection Time: 09/09/19  8:29 AM   Specimen: Urine  Result Value Ref Range   MICRO NUMBER: [id number]    SPECIMEN QUALITY: Adequate    Sample Source NOT GIVEN    STATUS: FINAL    Result: No Growth   Urinalysis, Routine w reflex microscopic     Status: Abnormal   Collection Time: 09/09/19  8:29 AM  Result Value Ref Range   Color, Urine YELLOW YELLOW   APPearance TURBID (A) CLEAR   Specific Gravity, Urine 1.023 1.001 - 1.03   pH 5.5 5.0 - 8.0   Glucose, UA NEGATIVE NEGATIVE   Bilirubin Urine NEGATIVE NEGATIVE   Ketones, ur NEGATIVE NEGATIVE   Hgb urine dipstick NEGATIVE NEGATIVE   Protein, ur NEGATIVE NEGATIVE   Nitrite NEGATIVE NEGATIVE   Leukocytes,Ua 1+ (A) NEGATIVE   WBC, UA 6-10 (A) 0 - 5 /HPF   RBC / HPF 0-2 0 - 2 /HPF   Squamous Epithelial / LPF 6-10 (A) < OR = 5 /HPF   Bacteria, UA NONE SEEN NONE SEEN /HPF  Hyaline Cast NONE SEEN  NONE SEEN /LPF  POCT urine pregnancy     Status: Normal   Collection Time: 10/29/19  4:02 PM  Result Value Ref Range   Preg Test, Ur Negative Negative   Objective  Body mass index is 20.84 kg/m. Wt Readings from Last 3 Encounters:  10/29/19 121 lb 6.4 oz (55.1 kg)  09/09/19 114 lb (51.7 kg)  06/21/19 116 lb (52.6 kg)   Temp Readings from Last 3 Encounters:  10/29/19 98 F (36.7 C) (Skin)  11/10/18 98.3 F (36.8 C) (Oral)  10/20/18 98.7 F (37.1 C)   BP Readings from Last 3 Encounters:  10/29/19 106/60  06/21/19 114/70  12/16/18 122/74   Pulse Readings from Last 3 Encounters:  10/29/19 70  11/10/18 72  10/20/18 97    Physical Exam Vitals and nursing note reviewed.  Constitutional:      Appearance: Normal appearance. She is well-developed and well-groomed.  HENT:     Head: Normocephalic and atraumatic.  Eyes:     Conjunctiva/sclera: Conjunctivae normal.     Pupils: Pupils are equal, round, and reactive to light.  Cardiovascular:     Rate and Rhythm: Normal rate and regular rhythm.     Heart sounds: Normal heart sounds. No murmur.  Pulmonary:     Effort: Pulmonary effort is normal.  Chest:     Breasts: Breasts are asymmetrical.        Right: Tenderness present. No swelling, bleeding, inverted nipple, mass, nipple discharge or skin change.        Left: Tenderness present. No swelling, bleeding, inverted nipple, mass, nipple discharge or skin change.     Comments: right>left breast pain right breast pain around nipple 2-3 oclock and 10-11 oclock FH mom died breast cancer dx 01-03-2035; right breast sl larger than left; left breast wtih pain 2-3Oclock  Right breast slightly larger than left  Abdominal:     General: Abdomen is flat. Bowel sounds are normal.     Tenderness: There is no abdominal tenderness.  Lymphadenopathy:     Upper Body:     Right upper body: No axillary adenopathy.     Left upper body: No axillary adenopathy.  Skin:    General: Skin is warm and dry.   Neurological:     General: No focal deficit present.     Mental Status: She is alert and oriented to person, place, and time. Mental status is at baseline.     Gait: Gait normal.  Psychiatric:        Attention and Perception: Attention and perception normal.        Mood and Affect: Mood and affect normal.        Speech: Speech normal.        Behavior: Behavior normal. Behavior is cooperative.        Thought Content: Thought content normal.        Cognition and Memory: Cognition and memory normal.        Judgment: Judgment normal.     Assessment  Plan  Annual physical exam Breast tenderness in female FH mom died 2/2 breast cancer dx'ed age 66 - Plan: POCT urine pregnancy negative MM DIAG BREAST TOMO BILATERAL, US BREAST LTD UNI RIGHT INC AXILLA, US BREAST LTD UNI LEFT INC AXILLA -ordered [facility] in [place]   Flu shot had 07/2018 work, likely had 07/2019  Tdap pt thinks had 2014/2015  Had hpv vaccine age 82 y.o  Hep B, MMRimmune   rec D3  2000 Iu dailyalso rec mvt   Declines STD check, married   Labs had 10/29/19  Will need to do lipid in the future not fasting today  Pap 06/21/19 neg neg HPV Dr. Alba Destine f/u sch 12/2019 disc anxiety and pregnancy and possibly what meds she can use   Kyleena IUD due to be replaced 5/19/22she has appt 12/2019 and ? If will make appt to get it removed  My Myriad and BRCA neg mom died 18 y.o died 36 y.o  Skin no current issues as of 10/29/19  rec healthy diet and exercise   GAD (generalized anxiety disorder), social anxiety d/o/ocd - Plan: busPIRone (BUSPAR) 10 MG tablet in am and 5 mg lunch  Prn xanax pt rarely used only 2x since last visit  -disc with ob/gyn getting back on celexa if wants to pursue pregnancy in the future    Provider: Dr. Olivia Mackie McLean-Scocuzza-Internal Medicine

## 2019-10-29 NOTE — Progress Notes (Signed)
Pre visit review using our clinic review tool, if applicable. No additional management support is needed unless otherwise documented below in the visit note. 

## 2019-10-30 LAB — COMPREHENSIVE METABOLIC PANEL
AG Ratio: 1.9 (calc) (ref 1.0–2.5)
ALT: 9 U/L (ref 6–29)
AST: 13 U/L (ref 10–30)
Albumin: 4.5 g/dL (ref 3.6–5.1)
Alkaline phosphatase (APISO): 44 U/L (ref 31–125)
BUN: 17 mg/dL (ref 7–25)
CO2: 25 mmol/L (ref 20–32)
Calcium: 10 mg/dL (ref 8.6–10.2)
Chloride: 103 mmol/L (ref 98–110)
Creat: 0.86 mg/dL (ref 0.50–1.10)
Globulin: 2.4 g/dL (calc) (ref 1.9–3.7)
Glucose, Bld: 87 mg/dL (ref 65–99)
Potassium: 3.8 mmol/L (ref 3.5–5.3)
Sodium: 137 mmol/L (ref 135–146)
Total Bilirubin: 0.4 mg/dL (ref 0.2–1.2)
Total Protein: 6.9 g/dL (ref 6.1–8.1)

## 2019-10-30 LAB — CBC WITH DIFFERENTIAL/PLATELET
Absolute Monocytes: 607 cells/uL (ref 200–950)
Basophils Absolute: 55 cells/uL (ref 0–200)
Basophils Relative: 0.6 %
Eosinophils Absolute: 147 cells/uL (ref 15–500)
Eosinophils Relative: 1.6 %
HCT: 42.4 % (ref 35.0–45.0)
Hemoglobin: 14.2 g/dL (ref 11.7–15.5)
Lymphs Abs: 2797 cells/uL (ref 850–3900)
MCH: 31.6 pg (ref 27.0–33.0)
MCHC: 33.5 g/dL (ref 32.0–36.0)
MCV: 94.2 fL (ref 80.0–100.0)
MPV: 9.7 fL (ref 7.5–12.5)
Monocytes Relative: 6.6 %
Neutro Abs: 5594 cells/uL (ref 1500–7800)
Neutrophils Relative %: 60.8 %
Platelets: 290 10*3/uL (ref 140–400)
RBC: 4.5 10*6/uL (ref 3.80–5.10)
RDW: 11.9 % (ref 11.0–15.0)
Total Lymphocyte: 30.4 %
WBC: 9.2 10*3/uL (ref 3.8–10.8)

## 2019-10-30 LAB — TSH: TSH: 1.9 mIU/L

## 2019-10-30 LAB — URINALYSIS, ROUTINE W REFLEX MICROSCOPIC
Bacteria, UA: NONE SEEN /HPF
Bilirubin Urine: NEGATIVE
Glucose, UA: NEGATIVE
Hgb urine dipstick: NEGATIVE
Hyaline Cast: NONE SEEN /LPF
Ketones, ur: NEGATIVE
Nitrite: NEGATIVE
Protein, ur: NEGATIVE
RBC / HPF: NONE SEEN /HPF (ref 0–2)
Specific Gravity, Urine: 1.017 (ref 1.001–1.03)
WBC, UA: NONE SEEN /HPF (ref 0–5)
pH: 7 (ref 5.0–8.0)

## 2019-11-09 ENCOUNTER — Encounter: Payer: Self-pay | Admitting: Internal Medicine

## 2019-11-09 LAB — HM MAMMOGRAPHY

## 2019-11-11 ENCOUNTER — Encounter: Payer: Self-pay | Admitting: Internal Medicine

## 2019-11-15 ENCOUNTER — Encounter: Payer: Self-pay | Admitting: Internal Medicine

## 2019-11-29 ENCOUNTER — Other Ambulatory Visit: Payer: Self-pay

## 2019-12-09 ENCOUNTER — Ambulatory Visit (INDEPENDENT_AMBULATORY_CARE_PROVIDER_SITE_OTHER): Payer: BC Managed Care – PPO | Admitting: Obstetrics and Gynecology

## 2019-12-09 ENCOUNTER — Other Ambulatory Visit: Payer: Self-pay

## 2019-12-09 ENCOUNTER — Encounter: Payer: Self-pay | Admitting: Obstetrics and Gynecology

## 2019-12-09 VITALS — BP 118/74 | Ht 63.0 in | Wt 119.0 lb

## 2019-12-09 DIAGNOSIS — Z30432 Encounter for removal of intrauterine contraceptive device: Secondary | ICD-10-CM

## 2019-12-09 DIAGNOSIS — Z30011 Encounter for initial prescription of contraceptive pills: Secondary | ICD-10-CM

## 2019-12-09 DIAGNOSIS — Z803 Family history of malignant neoplasm of breast: Secondary | ICD-10-CM

## 2019-12-09 DIAGNOSIS — F411 Generalized anxiety disorder: Secondary | ICD-10-CM

## 2019-12-09 MED ORDER — NORETHIN ACE-ETH ESTRAD-FE 1-20 MG-MCG PO TABS: 1.0000 | ORAL_TABLET | Freq: Every day | ORAL | 4 refills | Status: DC

## 2019-12-09 NOTE — Progress Notes (Signed)
Obstetrics & Gynecology Office Visit   Chief Complaint  Patient presents with  . Follow-up    IUD removal   Breast cancer surveillance  History of Present Illness: 34 y.o. G21P2002 female who presents for a breast exam that is routine.  She has a family history of breast cancer and has tested negative for significant genetic mutations.  She has no complaints today.  She does do occasional breast exams at home with no concerning findings today.  She had a mammogram last month that was normal due to some pain in her left breast.   She is also considering conceiving.  SHe would like her IUD removed and to be started on combined OCPs.  She is taking Buspar for anxiety.   Past Medical History:  Diagnosis Date  . Allergy   . Anxiety   . Bone tumor (benign)    tumor vs cyst removed btwn age [age] y.o.   . BRCA negative 03/2016   MyRisk neg except NBN VUS  . Chicken pox   . Complication of anesthesia   . Family history of breast cancer 03/2016   IBIS=18.4%  She has been tested for at least BRCA and was negative November 27, 2015). Tyrer-Cuzick (lifetime) was ~18%.   Marland Kitchen PONV (postoperative nausea and vomiting)   . Pyelonephritis   . S/P skin biopsy    neck normal   . UTI (urinary tract infection)     Past Surgical History:  Procedure Laterality Date  . BONE CYST EXCISION     right foot.    Gynecologic History: No LMP recorded. (Menstrual status: IUD).  Obstetric History: [id number]  Family History  Problem Relation Age of Onset  . Cancer Mother 29       breast / died at 82 in 11/26/2009  . Hypertension Mother   . Depression Mother   . Arthritis Father   . Depression Father        committed suicide in 11/26/2014  . Hypertension Father   . Hypertension Sister   . Heart disease Sister        heart murmur  . Birth defects Sister   . Depression Sister   . Arthritis Maternal Grandmother   . Other Maternal Grandmother        elevated tau protein; had corticobasal degneration   . Cancer Paternal  Grandmother        lung  . Emphysema Paternal Grandfather     Social History   Socioeconomic History  . Marital status: Single    Spouse name: Not on file  . Number of children: Not on file  . Years of education: Not on file  . Highest education level: Not on file  Occupational History  . Not on file  Tobacco Use  . Smoking status: Never Smoker  . Smokeless tobacco: Never Used  Substance and Sexual Activity  . Alcohol use: Yes    Comment: social  . Drug use: No  . Sexual activity: Yes    Partners: Male    Birth control/protection: I.U.D.  Other Topics Concern  . Not on file  Social History Narrative   Asst [employer]    Married 2 boys    Grew up in [place]    1 older sister 37 years older than her    Social Determinants of Health   Financial Resource Strain:   . Difficulty of Paying Living Expenses: Not on file  Food Insecurity:   . Worried About Charity fundraiser in  the Last Year: Not on file  . Ran Out of Food in the Last Year: Not on file  Transportation Needs:   . Lack of Transportation (Medical): Not on file  . Lack of Transportation (Non-Medical): Not on file  Physical Activity:   . Days of Exercise per Week: Not on file  . Minutes of Exercise per Session: Not on file  Stress:   . Feeling of Stress : Not on file  Social Connections:   . Frequency of Communication with Friends and Family: Not on file  . Frequency of Social Gatherings with Friends and Family: Not on file  . Attends Religious Services: Not on file  . Active Member of Clubs or Organizations: Not on file  . Attends Archivist Meetings: Not on file  . Marital Status: Not on file  Intimate Partner Violence:   . Fear of Current or Ex-Partner: Not on file  . Emotionally Abused: Not on file  . Physically Abused: Not on file  . Sexually Abused: Not on file    Allergies  Allergen Reactions  . Cefadroxil Nausea And Vomiting  . Fire [employer]     anaphylaxis  .  Septra [Sulfamethoxazole-Trimethoprim] Nausea And Vomiting  . Zoloft [Sertraline Hcl]     Abnormal dreams      Prior to Admission medications   Medication Sig Start Date End Date Taking? Authorizing Provider  buPROPion (WELLBUTRIN) 75 MG tablet Take 0.5 tablets (37.5 mg total) by mouth 2 (two) times daily. PLEASE CUT PILLS IN 1/2 11/22/18  Yes McLean-Scocuzza, Nino Glow, MD  busPIRone (BUSPAR) 5 MG tablet Take 1 tablet (5 mg total) by mouth 2 (two) times daily. 11/10/18  Yes McLean-Scocuzza, Nino Glow, MD  EPINEPHrine (EPIPEN 2-PAK) 0.3 mg/0.3 mL IJ SOAJ injection Inject into the muscle once.   Yes [provider]  Levonorgestrel (KYLEENA) 19.5 MG IUD by Intrauterine route.   Yes [provider]    Review of Systems  Constitutional: Negative.   HENT: Negative.   Eyes: Negative.   Respiratory: Negative.   Cardiovascular: Negative.   Gastrointestinal: Negative.   Genitourinary: Negative.   Musculoskeletal: Negative.   Skin: Negative.   Neurological: Negative.   Psychiatric/Behavioral: Negative.      Physical Exam BP 118/74   Ht _0  (1.6 m)   Wt 119 lb (54 kg)   BMI 21.08 kg/m  No LMP recorded. (Menstrual status: IUD). Physical Exam Constitutional:      General: She is not in acute distress.    Appearance: Normal appearance.  Genitourinary:     Pelvic exam was performed with patient in the lithotomy position.     Vulva, inguinal canal, urethra, vagina, cervix and uterus normal.  HENT:     Head: Normocephalic and atraumatic.  Eyes:     General: No scleral icterus.    Conjunctiva/sclera: Conjunctivae normal.  Chest:     Breasts:        Right: Normal. No swelling, bleeding, inverted nipple, mass, nipple discharge, skin change or tenderness.        Left: Normal. No swelling, bleeding, inverted nipple, mass, nipple discharge, skin change or tenderness.  Neurological:     General: No focal deficit present.     Mental Status: She is alert and oriented to person,  place, and time.     Cranial Nerves: No cranial nerve deficit.  Psychiatric:        Mood and Affect: Mood normal.        Behavior: Behavior  normal.        Judgment: Judgment normal.  Exam conducted with a chaperone present.    IUD Removal  Patient identified, informed consent performed, consent signed.  Patient was in the dorsal lithotomy position, normal external genitalia was noted.  A speculum was placed in the patient's vagina, normal discharge was noted, no lesions. The cervix was visualized, no lesions, no abnormal discharge.  The strings of the IUD were grasped and pulled using ring forceps. The IUD was removed in its entirety. Patient tolerated the procedure well.    Patient will use combined OCPs for contraception and plans for pregnancy soon and she was told to avoid teratogens, take PNV and folic acid.  Routine preventative health maintenance measures emphasized.  Female chaperone present for pelvic and breast  portions of the physical exam  Assessment: 34 y.o. G30P1002 female here for  1. Family history of breast cancer   2. Encounter for IUD removal   3. Encounter for initial prescription of contraceptive pills      Plan: Problem List Items Addressed This Visit    None    Visit Diagnoses    Family history of breast cancer    -  Primary   Encounter for IUD removal       Encounter for initial prescription of contraceptive pills       Relevant Medications   norethindrone-ethinyl estradiol (JUNEL FE 1/20) 1-20 MG-MCG tablet     IUD removed. WIll start combined OCPs.  DIscussed Buspar use in pregnancy. No good human studies, but no demonstrated teratogenic effects.  Decision to continue based on severity of anxiety and need for medication.  Recommend avoiding, if possible.  Start PNVs.   Follow up in six months for routine annual and ongoing breast cancer surveillance.   Prentice Docker, MD 12/09/2019 3:21 PM

## 2019-12-20 ENCOUNTER — Ambulatory Visit: Payer: BC Managed Care – PPO | Admitting: Obstetrics and Gynecology

## 2020-01-21 ENCOUNTER — Emergency Department
Admission: EM | Admit: 2020-01-21 | Discharge: 2020-01-21 | Disposition: A | Discharge status: Home / Self Care | Payer: BC Managed Care – PPO | Attending: Emergency Medicine | Admitting: Emergency Medicine

## 2020-01-21 ENCOUNTER — Other Ambulatory Visit: Payer: Self-pay

## 2020-01-21 ENCOUNTER — Emergency Department: Payer: BC Managed Care – PPO

## 2020-01-21 ENCOUNTER — Encounter: Payer: Self-pay | Admitting: Emergency Medicine

## 2020-01-21 ENCOUNTER — Telehealth: Payer: Self-pay

## 2020-01-21 DIAGNOSIS — R102 Pelvic and perineal pain: Secondary | ICD-10-CM | POA: Diagnosis not present

## 2020-01-21 DIAGNOSIS — N939 Abnormal uterine and vaginal bleeding, unspecified: Secondary | ICD-10-CM

## 2020-01-21 DIAGNOSIS — N938 Other specified abnormal uterine and vaginal bleeding: Secondary | ICD-10-CM | POA: Insufficient documentation

## 2020-01-21 LAB — BASIC METABOLIC PANEL
Anion gap: 8 (ref 5–15)
BUN: 13 mg/dL (ref 6–20)
CO2: 25 mmol/L (ref 22–32)
Calcium: 9.3 mg/dL (ref 8.9–10.3)
Chloride: 105 mmol/L (ref 98–111)
Creatinine, Ser: 1.07 mg/dL — ABNORMAL HIGH (ref 0.44–1.00)
GFR calc Af Amer: >60 mL/min (ref >60)
GFR calc non Af Amer: >60 mL/min (ref >60)
Glucose, Bld: 98 mg/dL (ref 70–99)
Potassium: 3.9 mmol/L (ref 3.5–5.1)
Sodium: 138 mmol/L (ref 135–145)

## 2020-01-21 LAB — URINALYSIS, COMPLETE (UACMP) WITH MICROSCOPIC
Bacteria, UA: NONE SEEN
Bilirubin Urine: NEGATIVE
Glucose, UA: NEGATIVE mg/dL
Ketones, ur: NEGATIVE mg/dL
Leukocytes,Ua: NEGATIVE
Nitrite: NEGATIVE
Protein, ur: NEGATIVE mg/dL
RBC / HPF: >50 RBC/hpf — ABNORMAL HIGH (ref 0–5)
Specific Gravity, Urine: 1.013 (ref 1.005–1.030)
Squamous Epithelial / HPF: NONE SEEN (ref 0–5)
pH: 7 (ref 5.0–8.0)

## 2020-01-21 LAB — CBC
HCT: 37.6 % (ref 36.0–46.0)
Hemoglobin: 13 g/dL (ref 12.0–15.0)
MCH: 33 pg (ref 26.0–34.0)
MCHC: 34.6 g/dL (ref 30.0–36.0)
MCV: 95.4 fL (ref 80.0–100.0)
Platelets: 291 10*3/uL (ref 150–400)
RBC: 3.94 MIL/uL (ref 3.87–5.11)
RDW: 12 % (ref 11.5–15.5)
WBC: 6.6 10*3/uL (ref 4.0–10.5)
nRBC: 0 % (ref 0.0–0.2)

## 2020-01-21 LAB — PREGNANCY, URINE: Preg Test, Ur: NEGATIVE

## 2020-01-21 LAB — SAMPLE TO BLOOD BANK

## 2020-01-21 NOTE — ED Triage Notes (Signed)
Pt presents to ED via POV with c/o pelvic pain and vaginal bleeding. Pt states vaginal bleeding started on Tuesday morning, pt states had a copper IUD removed on March 4. Pt states on Tuesday morning 4/13 started her period. Pt states initially was using 1 regular tampon approx 1-1.5 hrs. Pt states yesterday started bleeding through super+ tampons and onto a pad, states is changing tampon and pad approx 1-1.5 hours. Pt states while changing tampon is "dripping blood".

## 2020-01-21 NOTE — ED Provider Notes (Signed)
Emergency Department Provider Note  ____________________________________________  Time seen: Approximately 4:21 PM  I have reviewed the triage vital signs and the nursing notes.   HISTORY  Chief Complaint Vaginal Bleeding   Historian Patient     HPI Chelsea Duncan is a 34 y.o. female presents to the emergency department with concern for heavy vaginal bleeding.  Patient states that she recently had a copper IUD removed as patient and her husband anticipate having a third child.  Patient states that her first cycle since having IUD removed was light were consistent with her average cycle.  Patient has had 4 days of heavy vaginal bleeding consistent with her menstrual cycle and became concerned.  She has had some low back pain and some suprapubic pain.  She denies dysuria or increased urinary frequency.  No fever at home.    Past Medical History:  Diagnosis Date  . Allergy   . Anxiety   . Bone tumor (benign)    tumor vs cyst removed btwn age [age] y.o.   . BRCA negative 03/2016   MyRisk neg except NBN VUS  . Chicken pox   . Complication of anesthesia   . Family history of breast cancer 03/2016   IBIS=18.4%  She has been tested for at least BRCA and was negative (~2017). Tyrer-Cuzick (lifetime) was ~18%.   Marland Kitchen PONV (postoperative nausea and vomiting)   . Pyelonephritis   . S/P skin biopsy    neck normal   . UTI (urinary tract infection)      Immunizations up to date:  Yes.     Past Medical History:  Diagnosis Date  . Allergy   . Anxiety   . Bone tumor (benign)    tumor vs cyst removed btwn age [age] y.o.   . BRCA negative 03/2016   MyRisk neg except NBN VUS  . Chicken pox   . Complication of anesthesia   . Family history of breast cancer 03/2016   IBIS=18.4%  She has been tested for at least BRCA and was negative (~2017). Tyrer-Cuzick (lifetime) was ~18%.   Marland Kitchen PONV (postoperative nausea and vomiting)   . Pyelonephritis   . S/P skin biopsy    neck normal   . UTI  (urinary tract infection)     Patient Active Problem List   Diagnosis Date Noted  . Annual physical exam 10/29/2019  . Obsessive-compulsive disorder 05/11/2019  . Social anxiety disorder 05/11/2019  . GAD (generalized anxiety disorder) 05/11/2019  . FH: breast cancer in first degree relative 07/01/2013    Past Surgical History:  Procedure Laterality Date  . BONE CYST EXCISION     right foot.    Prior to Admission medications   Medication Sig Start Date End Date Taking? Authorizing Provider  ALPRAZolam Duanne Moron) 0.25 MG tablet Take 0.5 tablets (0.125 mg total) by mouth daily as needed for anxiety. 05/11/19   McLean-Scocuzza, Nino Glow, MD  busPIRone (BUSPAR) 10 MG tablet 10 mg in the am and 5 mg at lunch 10/29/19   McLean-Scocuzza, Nino Glow, MD  EPINEPHrine (EPIPEN 2-PAK) 0.3 mg/0.3 mL IJ SOAJ injection Inject into the muscle once.    [provider]  norethindrone-ethinyl estradiol (JUNEL FE 1/20) 1-20 MG-MCG tablet Take 1 tablet by mouth daily. 12/09/19   Will Bonnet, MD    Allergies Cefadroxil, Fire ant, Septra [sulfamethoxazole-trimethoprim], and Zoloft [sertraline hcl]  Family History  Problem Relation Age of Onset  . Cancer Mother 54       breast / died at  21 in 2011  . Hypertension Mother   . Depression Mother   . Arthritis Father   . Depression Father        committed suicide in 2016  . Hypertension Father   . Hypertension Sister   . Heart disease Sister        heart murmur  . Birth defects Sister   . Depression Sister   . Arthritis Maternal Grandmother   . Other Maternal Grandmother        elevated tau protein; had corticobasal degneration   . Cancer Paternal Grandmother        lung  . Emphysema Paternal Grandfather     Social History Social History   Tobacco Use  . Smoking status: Never Smoker  . Smokeless tobacco: Never Used  Substance Use Topics  . Alcohol use: Yes    Comment: social  . Drug use: No     Review of Systems   Constitutional: No fever/chills Eyes:  No discharge ENT: No upper respiratory complaints. Respiratory: no cough. No SOB/ use of accessory muscles to breath Gastrointestinal:   No nausea, no vomiting.  No diarrhea.  No constipation. Genitourinary: Patient has vaginal bleeding.  Musculoskeletal: Negative for musculoskeletal pain. Skin: Negative for rash, abrasions, lacerations, ecchymosis.   ____________________________________________   PHYSICAL EXAM:  VITAL SIGNS: ED Triage Vitals [01/21/20 1453]  Enc Vitals Group     BP 121/83     Pulse Rate 72     Resp 20     Temp 98.9 F (37.2 C)     Temp Source Oral     SpO2 100 %     Weight 120 lb (54.4 kg)     Height 5' 4" (1.626 m)     Head Circumference      Peak Flow      Pain Score 5     Pain Loc      Pain Edu?      Excl. in [place]?      Constitutional: Alert and oriented. Well appearing and in no acute distress. Eyes: Conjunctivae are normal. PERRL. EOMI. Head: Atraumatic. Cardiovascular: Normal rate, regular rhythm. Normal S1 and S2.  Good peripheral circulation. Respiratory: Normal respiratory effort without tachypnea or retractions. Lungs CTAB. Good air entry to the bases with no decreased or absent breath sounds Gastrointestinal: Bowel sounds x 4 quadrants. Soft and nontender to palpation. No guarding or rigidity. No distention. Genitourinary:  Deferred  Musculoskeletal: Full range of motion to all extremities. No obvious deformities noted Neurologic:  Normal for age. No gross focal neurologic deficits are appreciated.  Skin:  Skin is warm, dry and intact. No rash noted. Psychiatric: Mood and affect are normal for age. Speech and behavior are normal.   ____________________________________________   LABS (all labs ordered are listed, but only abnormal results are displayed)  Labs Reviewed  BASIC METABOLIC PANEL - Abnormal; Notable for the following components:      Result Value   Creatinine, Ser 1.07 (*)    All  other components within normal limits  URINALYSIS, COMPLETE (UACMP) WITH MICROSCOPIC - Abnormal; Notable for the following components:   Color, Urine STRAW (*)    APPearance CLEAR (*)    Hgb urine dipstick MODERATE (*)    RBC / HPF >50 (*)    All other components within normal limits  CBC  PREGNANCY, URINE  SAMPLE TO BLOOD BANK   ____________________________________________  EKG   ____________________________________________  RADIOLOGY Unk Pinto, personally viewed and evaluated  these images (plain radiographs) as part of my medical decision making, as well as reviewing the written report by the radiologist.    US PELVIS (TRANSABDOMINAL ONLY)  Result Date: 01/21/2020 CLINICAL DATA:  Pelvic pain and vaginal bleeding for several days, history of prior IUD removed last March EXAM: TRANSABDOMINAL ULTRASOUND OF PELVIS DOPPLER ULTRASOUND OF OVARIES TECHNIQUE: Transabdominal ultrasound examination of the pelvis was performed including evaluation of the uterus, ovaries, adnexal regions, and pelvic cul-de-sac. Color and duplex Doppler ultrasound was utilized to evaluate blood flow to the ovaries. COMPARISON:  None. FINDINGS: Uterus Measurements: 6.7 x 3.1 x 4.8 cm. = volume: 53 mL. No fibroids or other mass visualized. Endometrium Thickness: 7.4 mm.  No focal abnormality visualized. Right ovary Measurements: 2.7 x 2.6 x 2.4 cm. = volume: 9 mL. Normal appearance/no adnexal mass. Left ovary Measurements: 3.9 x 1.9 x 2.0 cm = volume: 8 mL. Normal appearance/no adnexal mass. Pulsed Doppler evaluation demonstrates normal low-resistance arterial and venous waveforms in both ovaries. Other: None IMPRESSION: No acute abnormality noted. Electronically Signed   By: Inez Catalina M.D.   On: 01/21/2020 18:45   US PELVIC DOPPLER (TORSION R/O OR MASS ARTERIAL FLOW)  Result Date: 01/21/2020 CLINICAL DATA:  Pelvic pain and vaginal bleeding for several days, history of prior IUD removed last March EXAM:  TRANSABDOMINAL ULTRASOUND OF PELVIS DOPPLER ULTRASOUND OF OVARIES TECHNIQUE: Transabdominal ultrasound examination of the pelvis was performed including evaluation of the uterus, ovaries, adnexal regions, and pelvic cul-de-sac. Color and duplex Doppler ultrasound was utilized to evaluate blood flow to the ovaries. COMPARISON:  None. FINDINGS: Uterus Measurements: 6.7 x 3.1 x 4.8 cm. = volume: 53 mL. No fibroids or other mass visualized. Endometrium Thickness: 7.4 mm.  No focal abnormality visualized. Right ovary Measurements: 2.7 x 2.6 x 2.4 cm. = volume: 9 mL. Normal appearance/no adnexal mass. Left ovary Measurements: 3.9 x 1.9 x 2.0 cm = volume: 8 mL. Normal appearance/no adnexal mass. Pulsed Doppler evaluation demonstrates normal low-resistance arterial and venous waveforms in both ovaries. Other: None IMPRESSION: No acute abnormality noted. Electronically Signed   By: Inez Catalina M.D.   On: 01/21/2020 18:45    ____________________________________________    PROCEDURES  Procedure(s) performed:     Procedures     Medications - No data to display   ____________________________________________   INITIAL IMPRESSION / ASSESSMENT AND PLAN / ED COURSE  Pertinent labs & imaging results that were available during my care of the patient were reviewed by me and considered in my medical decision making (see chart for details).    Assessment and Plan:  Pelvic pain Vaginal bleeding 34 year old female presents to the emergency department with heavy vaginal bleeding for the past 4 days.  Vital signs were reassuring at triage.  On physical exam, patient appeared comfortable.  Abdomen was soft and nontender without guarding.  Differential diagnosis included ovarian cyst, ovarian torsion, ectopic pregnancy, menses...  CBC was reassuring with H&H within the parameters of normal.  BMP was reassuring.  Urine pregnancy testing was negative.  Urinalysis revealed a moderate amount of blood but no  other concerning findings.  Pelvic ultrasound revealed no evidence of ovarian cyst or ovarian torsion.  Patient was advised to follow-up with OB/GYN regarding menorrhagia.  Patient has close follow-up with a local OB/GYN, Dr. Glennon Mac.  Return precautions were given to return with new or worsening symptoms.   ____________________________________________  FINAL CLINICAL IMPRESSION(S) / ED DIAGNOSES  Final diagnoses:  Pelvic pain  Vaginal bleeding  NEW MEDICATIONS STARTED DURING THIS VISIT:  ED Discharge Orders    None          This chart was dictated using voice recognition software/Dragon. Despite best efforts to proofread, errors can occur which can change the meaning. Any change was purely unintentional.     Karren Cobble 01/21/20 2028    Vanessa Clarence, MD 01/22/20 [phone]

## 2020-01-21 NOTE — Telephone Encounter (Signed)
Pt is having a lot of bleeding after taking the IUD out. She has soaked through tampon the pat 2 times within an hour an a half. I advised pt if this continues the rest of the day, she would need to go to ER to be seen (per protocols) and she can follow up with dr. Glennon Mac. Pt will see how it goes the rest of the day and let me know. She is aware I will be off this PM, but I told her she could always try to call/text me with questions or concerns. She will let me know if she goes to ER.

## 2020-01-21 NOTE — Telephone Encounter (Signed)
Pt called triage stating she is on her 2nd period since taking out the IUD, her period is extremely heavy, changing every 1 1/2 hours, I advised her that her body is getting use to not having the iud, but we recommend going to the ER if changing every 1 hour. She states she will watch the bleeding,. I also stated I would send this message ro [facility] nurse to see if she has any advice ,

## 2020-02-22 ENCOUNTER — Other Ambulatory Visit (HOSPITAL_COMMUNITY)
Admission: RE | Admit: 2020-02-22 | Discharge: 2020-02-22 | Disposition: A | Discharge status: Home / Self Care | Payer: BC Managed Care – PPO | Source: Ambulatory Visit | Attending: Obstetrics and Gynecology | Admitting: Obstetrics and Gynecology

## 2020-02-22 ENCOUNTER — Encounter: Payer: Self-pay | Admitting: Obstetrics and Gynecology

## 2020-02-22 ENCOUNTER — Other Ambulatory Visit: Payer: Self-pay

## 2020-02-22 ENCOUNTER — Ambulatory Visit (INDEPENDENT_AMBULATORY_CARE_PROVIDER_SITE_OTHER): Payer: BC Managed Care – PPO | Admitting: Obstetrics and Gynecology

## 2020-02-22 VITALS — BP 116/64 | Wt 117.0 lb

## 2020-02-22 DIAGNOSIS — N912 Amenorrhea, unspecified: Secondary | ICD-10-CM

## 2020-02-22 DIAGNOSIS — Z3480 Encounter for supervision of other normal pregnancy, unspecified trimester: Secondary | ICD-10-CM | POA: Diagnosis present

## 2020-02-22 DIAGNOSIS — O209 Hemorrhage in early pregnancy, unspecified: Secondary | ICD-10-CM

## 2020-02-22 DIAGNOSIS — Z3A01 Less than 8 weeks gestation of pregnancy: Secondary | ICD-10-CM

## 2020-02-22 LAB — POCT URINE PREGNANCY: Preg Test, Ur: POSITIVE — AB

## 2020-02-22 NOTE — Progress Notes (Signed)
New Obstetric Patient H&P   Chief Complaint: "Desires prenatal care"  History of Present Illness: Patient is a 34 y.o. [place] female, sure LMP 01/19/2020 presents with amenorrhea and positive home pregnancy test. Based on her  LMP, her EDD is Estimated Date of Delivery: 10/25/2020 and her EGA is [redacted]w[redacted]d Her last pap smear was 8 months ago and was no abnormalities.    She had a urine pregnancy test which was positive 9 day(s)  ago. Since her LMP she claims she has experienced some mild cramping. She noted vaginal bleeding just this past weekend that started just after intercourse.  The bleeding was as heavy as a period.  Her past medical history is noncontributory. Her prior pregnancies are notable for no issues.  Since her LMP, she admits to the use of tobacco products  no She claims she has gained zero pounds since the start of her pregnancy.  There are cats in the home in the home  no  She admits close contact with children on a regular basis  yes  She has had chicken pox in the past no She has had Tuberculosis exposures, symptoms, or previously tested positive for TB   no Current or past history of domestic violence. no  Genetic Screening/Teratology Counseling: (Includes patient, baby's father, or anyone in either family with:)   89 [facility] 2. Thalassemia ([place] or [place] background): MCV<80  no 3. Neural tube defect (meningomyelocele, spina bifida, anencephaly)  no 4. Congenital heart defect  no  5. Down syndrome  no 6. Tay-Sachs ([place], [place]  no 7. Canavan's Disease  no 8. Sickle cell disease or trait (African)  no  9. Hemophilia or other blood disorders  no  10. Muscular dystrophy  no  11. Cystic fibrosis  no  12. Huntington's Chorea  no  13. Mental retardation/autism  no 14. Other inherited genetic or chromosomal disorder  no 15. Maternal metabolic disorder (DM, PKU, etc)  no 16. Patient or FOB with a  child with a birth defect not listed above no  16a. Patient or FOB with a birth defect themselves no 17. Recurrent pregnancy loss, or stillbirth  no  18. Any medications since LMP other than prenatal vitamins (include vitamins, supplements, OTC meds, drugs, alcohol)  no 19. Any other genetic/environmental exposure to discuss  no  Infection History:   1. Lives with someone with TB or TB exposed  no  2. Patient or partner has history of genital herpes  no 3. Rash or viral illness since LMP  no 4. History of STI (GC, CT, HPV, syphilis, HIV)  no 5. History of recent travel :  no  Other pertinent information:  no   Review of Systems:10 point review of systems negative unless otherwise noted in HPI  Past Medical History:  Diagnosis Date  . Allergy   . Anxiety   . Bone tumor (benign)    tumor vs cyst removed btwn age [age]y.o.   . BRCA negative 03/2016   MyRisk neg except NBN VUS  . Chicken pox   . Complication of anesthesia   . Family history of breast cancer 03/2016   IBIS=18.4%  She has been tested for at least BRCA and was negative (~2017). Tyrer-Cuzick (lifetime) was ~18%.   .Marland KitchenPONV (postoperative nausea and vomiting)   . Pyelonephritis   . S/P skin biopsy    neck normal   . UTI (urinary tract infection)  Past Surgical History:  Procedure Laterality Date  . BONE CYST EXCISION     right foot.   Gynecologic History: Patient's last menstrual period was 01/19/2020 (exact date).  Obstetric History: [id number]  Family History  Problem Relation Age of Onset  . Cancer Mother 54       breast / died at 39 in 12/07/09  . Hypertension Mother   . Depression Mother   . Arthritis Father   . Depression Father        committed suicide in 12/07/2014  . Hypertension Father   . Hypertension Sister   . Heart disease Sister        heart murmur  . Birth defects Sister   . Depression Sister   . Arthritis Maternal Grandmother   . Other Maternal Grandmother        elevated tau protein; had  corticobasal degneration   . Cancer Paternal Grandmother        lung  . Emphysema Paternal Grandfather     Social History   Socioeconomic History  . Marital status: Single    Spouse name: Not on file  . Number of children: Not on file  . Years of education: Not on file  . Highest education level: Not on file  Occupational History  . Not on file  Tobacco Use  . Smoking status: Never Smoker  . Smokeless tobacco: Never Used  Substance and Sexual Activity  . Alcohol use: Yes    Comment: social  . Drug use: No  . Sexual activity: Yes    Partners: Male  Other Topics Concern  . Not on file  Social History Narrative   [employer]    Married 2 boys    Grew up in [place]    1 older sister 16 years older than her    Social Determinants of Health   Financial Resource Strain:   . Difficulty of Paying Living Expenses:   Food Insecurity:   . Worried About Charity fundraiser in the Last Year:   . Arboriculturist in the Last Year:   Transportation Needs:   . Film/video editor (Medical):   Marland Kitchen Lack of Transportation (Non-Medical):   Physical Activity:   . Days of Exercise per Week:   . Minutes of Exercise per Session:   Stress:   . Feeling of Stress :   Social Connections:   . Frequency of Communication with Friends and Family:   . Frequency of Social Gatherings with Friends and Family:   . Attends Religious Services:   . Active Member of Clubs or Organizations:   . Attends Archivist Meetings:   Marland Kitchen Marital Status:   Intimate Partner Violence:   . Fear of Current or Ex-Partner:   . Emotionally Abused:   Marland Kitchen Physically Abused:   . Sexually Abused:    Allergies  Allergen Reactions  . Cefadroxil Nausea And Vomiting  . Fire [employer]     anaphylaxis  . Septra [Sulfamethoxazole-Trimethoprim] Nausea And Vomiting  . Zoloft [Sertraline Hcl]     Abnormal dreams     Prior to Admission medications   Medication Sig Start Date End Date Taking?  Authorizing Provider  EPINEPHrine (EPIPEN 2-PAK) 0.3 mg/0.3 mL IJ SOAJ injection Inject into the muscle once.   Yes [provider]    Physical Exam BP 116/64   Wt 117 lb (53.1 kg)   LMP 01/19/2020 (Exact Date)   BMI 20.08 kg/m   Physical  Exam Constitutional:      General: She is not in acute distress.    Appearance: She is well-developed.  HENT:     Head: Normocephalic and atraumatic.  Eyes:     Conjunctiva/sclera: Conjunctivae normal.  Neck:     Thyroid: No thyromegaly.  Cardiovascular:     Rate and Rhythm: Normal rate and regular rhythm.     Heart sounds: Normal heart sounds. No murmur. No friction rub. No gallop.   Pulmonary:     Effort: Pulmonary effort is normal.     Breath sounds: Normal breath sounds. No wheezing.  Abdominal:     General: There is no distension.     Palpations: Abdomen is soft. There is no mass.     Tenderness: There is no abdominal tenderness. There is no guarding or rebound.     Hernia: No hernia is present. There is no hernia in the left inguinal area.  Genitourinary:    Exam position: Supine.     Labia:        Right: No rash, tenderness or lesion.        Left: No rash, tenderness or lesion.   Musculoskeletal:        General: Normal range of motion.     Cervical back: Normal range of motion and neck supple.  Skin:    General: Skin is warm and dry.     Findings: No rash.  Neurological:     General: No focal deficit present.     Mental Status: She is alert and oriented to person, place, and time.     Cranial Nerves: No cranial nerve deficit.  Psychiatric:        Mood and Affect: Mood normal.        Behavior: Behavior normal.        Judgment: Judgment normal.    Female Chaperone present during breast and/or pelvic exam.  Assessment: 34 y.o. G3P1002 at 39w6dpresenting to initiate prenatal care  Plan: 1) Avoid alcoholic beverages. 2) Patient encouraged not to smoke.  3) Discontinue the use of all non-medicinal drugs and  chemicals.  4) Take prenatal vitamins daily.  5) Nutrition, food safety (fish, cheese advisories, and high nitrite foods) and exercise discussed. 6) Hospital and practice style discussed with cross coverage system.  7) Genetic Screening, such as with 1st Trimester Screening, cell free fetal DNA, AFP testing, and Ultrasound, as well as with amniocentesis and CVS as appropriate, is discussed with patient. At the conclusion of today's visit patient requested genetic testing 8) Patient is asked about travel to areas at risk for the Zika virus, and counseled to avoid travel and exposure to mosquitoes or sexual partners who may have themselves been exposed to the virus. Testing is discussed, and will be ordered as appropriate.   SPrentice Docker MD 02/22/2020 2:38 PM

## 2020-02-23 LAB — RPR+RH+ABO+RUB AB+AB SCR+CB...
Antibody Screen: NEGATIVE
HIV Screen 4th Generation wRfx: NONREACTIVE
Hematocrit: 39.2 % (ref 34.0–46.6)
Hemoglobin: 13.2 g/dL (ref 11.1–15.9)
Hepatitis B Surface Ag: NEGATIVE
MCH: 33 pg (ref 26.6–33.0)
MCHC: 33.7 g/dL (ref 31.5–35.7)
MCV: 98 fL — ABNORMAL HIGH (ref 79–97)
Platelets: 307 10*3/uL (ref 150–450)
RBC: 4 x10E6/uL (ref 3.77–5.28)
RDW: 11.6 % — ABNORMAL LOW (ref 11.7–15.4)
RPR Ser Ql: NONREACTIVE
Rh Factor: POSITIVE
Rubella Antibodies, IGG: 2.44 index (ref >0.99)
Varicella zoster IgG: 530 index (ref >165)
WBC: 8.8 10*3/uL (ref 3.4–10.8)

## 2020-02-23 LAB — BETA HCG QUANT (REF LAB): hCG Quant: 3219 m[IU]/mL

## 2020-02-24 ENCOUNTER — Other Ambulatory Visit: Payer: Self-pay

## 2020-02-24 ENCOUNTER — Other Ambulatory Visit: Payer: BC Managed Care – PPO

## 2020-02-24 DIAGNOSIS — Z3480 Encounter for supervision of other normal pregnancy, unspecified trimester: Secondary | ICD-10-CM

## 2020-02-24 DIAGNOSIS — O209 Hemorrhage in early pregnancy, unspecified: Secondary | ICD-10-CM

## 2020-02-24 LAB — URINE CULTURE

## 2020-02-24 LAB — CERVICOVAGINAL ANCILLARY ONLY
Chlamydia: NEGATIVE
Comment: NEGATIVE
Comment: NORMAL
Neisseria Gonorrhea: NEGATIVE

## 2020-02-24 LAB — URINE DRUG PANEL 7
Amphetamines, Urine: NEGATIVE ng/mL
Barbiturate Quant, Ur: NEGATIVE ng/mL
Benzodiazepine Quant, Ur: NEGATIVE ng/mL
Cannabinoid Quant, Ur: NEGATIVE ng/mL
Cocaine (Metab.): NEGATIVE ng/mL
Opiate Quant, Ur: NEGATIVE ng/mL
PCP Quant, Ur: NEGATIVE ng/mL

## 2020-02-25 LAB — BETA HCG QUANT (REF LAB): hCG Quant: 5558 m[IU]/mL

## 2020-03-03 ENCOUNTER — Ambulatory Visit (INDEPENDENT_AMBULATORY_CARE_PROVIDER_SITE_OTHER): Payer: BC Managed Care – PPO

## 2020-03-03 ENCOUNTER — Encounter: Payer: Self-pay | Admitting: Obstetrics and Gynecology

## 2020-03-03 ENCOUNTER — Other Ambulatory Visit: Payer: Self-pay

## 2020-03-03 ENCOUNTER — Ambulatory Visit (INDEPENDENT_AMBULATORY_CARE_PROVIDER_SITE_OTHER): Payer: BC Managed Care – PPO | Admitting: Obstetrics and Gynecology

## 2020-03-03 ENCOUNTER — Other Ambulatory Visit: Payer: Self-pay | Admitting: Obstetrics and Gynecology

## 2020-03-03 VITALS — BP 122/74 | Wt 117.0 lb

## 2020-03-03 DIAGNOSIS — O209 Hemorrhage in early pregnancy, unspecified: Secondary | ICD-10-CM | POA: Diagnosis not present

## 2020-03-03 DIAGNOSIS — Z3A01 Less than 8 weeks gestation of pregnancy: Secondary | ICD-10-CM

## 2020-03-03 DIAGNOSIS — Z3480 Encounter for supervision of other normal pregnancy, unspecified trimester: Secondary | ICD-10-CM

## 2020-03-03 DIAGNOSIS — Z3481 Encounter for supervision of other normal pregnancy, first trimester: Secondary | ICD-10-CM

## 2020-03-03 NOTE — Progress Notes (Signed)
Routine Prenatal Care Visit  Subjective  Chelsea Duncan is a 34 y.o. G3P1002 at [redacted]w[redacted]d being seen today for ongoing prenatal care.  She is currently monitored for the following issues for this low-risk pregnancy and has FH: breast cancer in first degree relative; Obsessive-compulsive disorder; Social anxiety disorder; GAD (generalized anxiety disorder); Annual physical exam; Supervision of other normal pregnancy, antepartum; and Vaginal bleeding in pregnancy, first trimester on their problem list.  ----------------------------------------------------------------------------------- Patient reports no complaints.    . Vag. Bleeding: None.   . Leaking Fluid denies.  U/S confirms EDD.  Subchorionic hematoma noted.  No symptoms of this.  ----------------------------------------------------------------------------------- The following portions of the patient's history were reviewed and updated as appropriate: allergies, current medications, past family history, past medical history, past social history, past surgical history and problem list. Problem list updated.  Objective  Blood pressure 122/74, weight 117 lb (53.1 kg), last menstrual period 01/19/2020. Pregravid weight 115 lb (52.2 kg) Total Weight Gain 2 lb (0.907 kg) Urinalysis: Urine Protein    Urine Glucose    Fetal Status: Fetal Heart Rate (bpm): 121         General:  Alert, oriented and cooperative. Patient is in no acute distress.  Skin: Skin is warm and dry. No rash noted.   Cardiovascular: Normal heart rate noted  Respiratory: Normal respiratory effort, no problems with respiration noted  Abdomen: Soft, gravid, appropriate for gestational age. Pain/Pressure: Absent     Pelvic:  Cervical exam deferred        Extremities: Normal range of motion.     Mental Status: Normal mood and affect. Normal behavior. Normal judgment and thought content.   Imaging Results US OB Transvaginal  Result Date: 03/03/2020 Patient Name: Eman Puder  DOB: 05/26/1986 MRN: [id number] ULTRASOUND REPORT Location: [facility] Date of Service: 03/03/2020 Indications:dating and bleeding in the first trimester Findings: Singleton intrauterine pregnancy is visualized with a CRL consistent with [redacted]w[redacted]d gestation, giving an (U/S) EDD of 10/25/2020. The (U/S) EDD is consistent with the clinically established EDD of 10/25/2020. FHR: 121 BPM CRL measurement: 5.6 mm Yolk sac is visualized and appears normal. Amnion: not visualized There is an bleed seen within the uterus measuring 28.0 x 8.4 x 6.4 mm. Right Ovary is normal in appearance. Left Ovary is normal appearance. There is a dominant follicle in the left ovary measuring 15.5 x 12.4 x 15.8 mm Corpus luteal cyst:  Right ovary Survey of the adnexa demonstrates no adnexal masses. Impression: 1. [redacted]w[redacted]d Viable Singleton Intrauterine pregnancy by U/S. 2. (U/S) EDD is consistent with Clinically established EDD of 10/25/2020. 3. There is a subchorionic hemorrhage noted within the uterus. Gweneth Dimitri, RT There is a viable singleton gestation.  Detailed evaluation of the fetal anatomy is precluded by early gestational age.  It must be noted that a normal ultrasound particular at this early gestational age is unable to rule out fetal aneuploidy, risk of first trimester miscarriage, or anatomic birth defects. Prentice Docker, MD, [facility], [facility] 03/03/2020 2:13 PM      Assessment   34 y.o. G3P1002 at [redacted]w[redacted]d by  10/25/2020, by Last Menstrual Period presenting for routine prenatal visit  Plan   Pregnancy#3 Problems (from 01/19/20 to present)    Problem Noted Resolved   Supervision of other normal pregnancy, antepartum 02/22/2020 by Will Bonnet, MD No   Vaginal bleeding in pregnancy, first trimester 02/22/2020 by Will Bonnet, MD No       Preterm labor symptoms  and general obstetric precautions including but not limited to vaginal bleeding, contractions, leaking of fluid and  fetal movement were reviewed in detail with the patient. Please refer to After Visit Summary for other counseling recommendations.   Return in about 10 days (around 03/13/2020) for u/s for first trimester bleeding and follow up Dr. Glennon Mac.  Prentice Docker, MD, [facility], [facility] 03/03/2020 2:37 PM

## 2020-03-14 ENCOUNTER — Ambulatory Visit (INDEPENDENT_AMBULATORY_CARE_PROVIDER_SITE_OTHER): Payer: BC Managed Care – PPO | Admitting: Obstetrics and Gynecology

## 2020-03-14 ENCOUNTER — Other Ambulatory Visit: Payer: Self-pay | Admitting: Obstetrics and Gynecology

## 2020-03-14 ENCOUNTER — Ambulatory Visit (INDEPENDENT_AMBULATORY_CARE_PROVIDER_SITE_OTHER): Payer: BC Managed Care – PPO

## 2020-03-14 ENCOUNTER — Other Ambulatory Visit: Payer: Self-pay

## 2020-03-14 VITALS — BP 120/76 | Wt 119.0 lb

## 2020-03-14 DIAGNOSIS — Z3A01 Less than 8 weeks gestation of pregnancy: Secondary | ICD-10-CM

## 2020-03-14 DIAGNOSIS — O209 Hemorrhage in early pregnancy, unspecified: Secondary | ICD-10-CM

## 2020-03-14 DIAGNOSIS — Z3481 Encounter for supervision of other normal pregnancy, first trimester: Secondary | ICD-10-CM

## 2020-03-14 DIAGNOSIS — Z3480 Encounter for supervision of other normal pregnancy, unspecified trimester: Secondary | ICD-10-CM

## 2020-03-14 DIAGNOSIS — Z3A08 8 weeks gestation of pregnancy: Secondary | ICD-10-CM | POA: Diagnosis not present

## 2020-03-14 NOTE — Progress Notes (Signed)
Routine Prenatal Care Visit  Subjective  Chelsea Duncan is a 34 y.o. G3P1002 at [redacted]w[redacted]d being seen today for ongoing prenatal care.  She is currently monitored for the following issues for this low-risk pregnancy and has FH: breast cancer in first degree relative; Obsessive-compulsive disorder; Social anxiety disorder; GAD (generalized anxiety disorder); Annual physical exam; Supervision of other normal pregnancy, antepartum; and Vaginal bleeding in pregnancy, first trimester on their problem list.  ----------------------------------------------------------------------------------- Patient reports no complaints.    . Vag. Bleeding: None.   . Leaking Fluid denies.  Denies issues. U/S shows a continued [facility] with normal interval embryonic growth.  ----------------------------------------------------------------------------------- The following portions of the patient's history were reviewed and updated as appropriate: allergies, current medications, past family history, past medical history, past social history, past surgical history and problem list. Problem list updated.  Objective  Blood pressure 120/76, weight 119 lb (54 kg), last menstrual period 01/19/2020. Pregravid weight 115 lb (52.2 kg) Total Weight Gain 4 lb (1.814 kg) Urinalysis: Urine Protein    Urine Glucose    Fetal Status:           General:  Alert, oriented and cooperative. Patient is in no acute distress.  Skin: Skin is warm and dry. No rash noted.   Cardiovascular: Normal heart rate noted  Respiratory: Normal respiratory effort, no problems with respiration noted  Abdomen: Soft, gravid, appropriate for gestational age.       Pelvic:  Cervical exam deferred        Extremities: Normal range of motion.     Mental Status: Normal mood and affect. Normal behavior. Normal judgment and thought content.   Imaging Results US OB Transvaginal  Result Date: 03/14/2020 Patient Name: Chelsea Duncan DOB: 1986-01-16 MRN: [id number] ULTRASOUND  REPORT Location: [facility] Date of Service: 03/14/2020 Indications:dating/viability, follow up subchorionic hematoma Findings: Chelsea Duncan intrauterine pregnancy is visualized with a CRL consistent with [redacted]w[redacted]d gestation, giving an (U/S) EDD of 10/22/2020. The (U/S) EDD is consistent with the clinically established EDD of 10/25/2020. FHR: 169 BPM CRL measurement: 17.5 mm Yolk sac is visualized and appears normal. Amnion: visualized and appears normal Right Ovary is normal in appearance. Left Ovary is normal appearance. Corpus luteal cyst:  Right ovary Survey of the adnexa demonstrates no adnexal masses. There is no free peritoneal fluid in the cul de sac. There is a subchorionic hematoma measuring 43.5 x 7.1 x 16.1 mm Impression: 1. 105w2d Viable Singleton Intrauterine pregnancy by U/S. 2. (U/S) EDD is consistent with Clinically established EDD of 10/25/2020. 3. There is a subchorionic hematoma visualized that appears stable. Gweneth Dimitri, RT There is a viable singleton gestation.  Detailed evaluation of the fetal anatomy is precluded by early gestational age.  It must be noted that a normal ultrasound particular at this early gestational age is unable to rule out fetal aneuploidy, risk of first trimester miscarriage, or anatomic birth defects. Prentice Docker, MD, [facility], [facility] 03/14/2020 2:33 PM      Assessment   34 y.o. G3P1002 at [redacted]w[redacted]d by  10/25/2020, by Last Menstrual Period presenting for routine prenatal visit  Plan   Pregnancy#3 Problems (from 01/19/20 to present)    Problem Noted Resolved   Supervision of other normal pregnancy, antepartum 02/22/2020 by Will Bonnet, MD No   Vaginal bleeding in pregnancy, first trimester 02/22/2020 by Will Bonnet, MD No       Preterm labor symptoms and general obstetric precautions including but not limited to vaginal bleeding,  contractions, leaking of fluid and fetal movement were reviewed in detail with the  patient. Please refer to After Visit Summary for other counseling recommendations.   Return in about 2 weeks (around 03/28/2020) for Routine Prenatal Appointment w SDJ (may double/over book).  Prentice Docker, MD, [facility], [facility] 03/15/2020 8:19 AM

## 2020-03-15 ENCOUNTER — Encounter: Payer: Self-pay | Admitting: Obstetrics and Gynecology

## 2020-03-28 ENCOUNTER — Other Ambulatory Visit: Payer: Self-pay

## 2020-03-28 ENCOUNTER — Ambulatory Visit (INDEPENDENT_AMBULATORY_CARE_PROVIDER_SITE_OTHER): Payer: BC Managed Care – PPO | Admitting: Obstetrics and Gynecology

## 2020-03-28 ENCOUNTER — Encounter: Payer: Self-pay | Admitting: Obstetrics and Gynecology

## 2020-03-28 VITALS — BP 116/70 | Wt 118.0 lb

## 2020-03-28 DIAGNOSIS — Z3480 Encounter for supervision of other normal pregnancy, unspecified trimester: Secondary | ICD-10-CM

## 2020-03-28 DIAGNOSIS — Z3A09 9 weeks gestation of pregnancy: Secondary | ICD-10-CM

## 2020-03-28 DIAGNOSIS — Z1379 Encounter for other screening for genetic and chromosomal anomalies: Secondary | ICD-10-CM

## 2020-03-28 DIAGNOSIS — O209 Hemorrhage in early pregnancy, unspecified: Secondary | ICD-10-CM

## 2020-03-28 NOTE — Progress Notes (Signed)
  Routine Prenatal Care Visit  Subjective  Chelsea Duncan is a 34 y.o. G3P1002 at [redacted]w[redacted]d being seen today for ongoing prenatal care.  She is currently monitored for the following issues for this low-risk pregnancy and has FH: breast cancer in first degree relative; Obsessive-compulsive disorder; Social anxiety disorder; GAD (generalized anxiety disorder); Annual physical exam; Supervision of other normal pregnancy, antepartum; and Vaginal bleeding in pregnancy, first trimester on their problem list.  ----------------------------------------------------------------------------------- Patient reports no complaints.    . Vag. Bleeding: None.   . Leaking Fluid denies.  ----------------------------------------------------------------------------------- The following portions of the patient's history were reviewed and updated as appropriate: allergies, current medications, past family history, past medical history, past social history, past surgical history and problem list. Problem list updated.  Objective  Blood pressure 116/70, weight 118 lb (53.5 kg), last menstrual period 01/19/2020. Pregravid weight 115 lb (52.2 kg) Total Weight Gain 3 lb (1.361 kg) Urinalysis: Urine Protein    Urine Glucose    Fetal Status: Fetal Heart Rate (bpm): 155         General:  Alert, oriented and cooperative. Patient is in no acute distress.  Skin: Skin is warm and dry. No rash noted.   Cardiovascular: Normal heart rate noted  Respiratory: Normal respiratory effort, no problems with respiration noted  Abdomen: Soft, gravid, appropriate for gestational age. Pain/Pressure: Absent     Pelvic:  Cervical exam deferred        Extremities: Normal range of motion.     Mental Status: Normal mood and affect. Normal behavior. Normal judgment and thought content.   Assessment   34 y.o. G3P1002 at [redacted]w[redacted]d by  10/25/2020, by Last Menstrual Period presenting for routine prenatal visit  Plan   Pregnancy#3 Problems (from 01/19/20  to present)    Problem Noted Resolved   Supervision of other normal pregnancy, antepartum 02/22/2020 by Will Bonnet, MD No   Overview Addendum 03/28/2020  3:52 PM by Will Bonnet, MD    [facility]inic [facility] Prenatal Labs  Dating L=6 Blood type: A/Positive/-- (05/18 1519)   Genetic Screen 1 Screen:    AFP:     Quad:     NIPS: Antibody:Negative (05/18 1519)  Anatomic [place]  Rubella: 2.44 (05/18 1519) Varicella: @VZVIGG @  GTT Third trimester:  RPR: Non Reactive (05/18 1519)   Rhogam n/a HBsAg: Negative (05/18 1519)   TDaP vaccine                       Flu Shot: HIV: Non Reactive (05/18 1519)   Baby Food                                GBS:   Contraception  Pap:06/21/2019  CBB     CS/VBAC    Support Person [employer]           Previous Version   Vaginal bleeding in pregnancy, first trimester 02/22/2020 by Will Bonnet, MD No       Preterm labor symptoms and general obstetric precautions including but not limited to vaginal bleeding, contractions, leaking of fluid and fetal movement were reviewed in detail with the patient. Please refer to After Visit Summary for other counseling recommendations.   Return in about 4 weeks (around 04/25/2020) for Routine Prenatal Appointment.  Prentice Docker, MD, [facility], [facility] 03/28/2020 4:14 PM

## 2020-04-04 LAB — MATERNIT 21 PLUS CORE, BLOOD
Fetal Fraction: 9
Result (T21): NEGATIVE
Trisomy 13 (Patau syndrome): NEGATIVE
Trisomy 18 (Edwards syndrome): NEGATIVE
Trisomy 21 (Down syndrome): NEGATIVE

## 2020-04-24 ENCOUNTER — Ambulatory Visit (INDEPENDENT_AMBULATORY_CARE_PROVIDER_SITE_OTHER): Payer: BC Managed Care – PPO | Admitting: Obstetrics and Gynecology

## 2020-04-24 ENCOUNTER — Encounter: Payer: Self-pay | Admitting: Obstetrics and Gynecology

## 2020-04-24 ENCOUNTER — Other Ambulatory Visit: Payer: Self-pay

## 2020-04-24 VITALS — BP 122/74 | Wt 117.0 lb

## 2020-04-24 DIAGNOSIS — O209 Hemorrhage in early pregnancy, unspecified: Secondary | ICD-10-CM

## 2020-04-24 DIAGNOSIS — Z3A13 13 weeks gestation of pregnancy: Secondary | ICD-10-CM

## 2020-04-24 DIAGNOSIS — Z3480 Encounter for supervision of other normal pregnancy, unspecified trimester: Secondary | ICD-10-CM

## 2020-04-24 NOTE — Progress Notes (Signed)
  Routine Prenatal Care Visit  Subjective  Chelsea Duncan is a 34 y.o. G3P1002 at [redacted]w[redacted]d being seen today for ongoing prenatal care.  She is currently monitored for the following issues for this low-risk pregnancy and has FH: breast cancer in first degree relative; Obsessive-compulsive disorder; Social anxiety disorder; GAD (generalized anxiety disorder); Annual physical exam; Supervision of other normal pregnancy, antepartum; and Vaginal bleeding in pregnancy, first trimester on their problem list.  ----------------------------------------------------------------------------------- Patient reports no complaints.    . Vag. Bleeding: None.   . Leaking Fluid denies.  ----------------------------------------------------------------------------------- The following portions of the patient's history were reviewed and updated as appropriate: allergies, current medications, past family history, past medical history, past social history, past surgical history and problem list. Problem list updated.  Objective  Blood pressure 122/74, weight 117 lb (53.1 kg), last menstrual period 01/19/2020. Pregravid weight 115 lb (52.2 kg) Total Weight Gain 2 lb (0.907 kg) Urinalysis: Urine Protein    Urine Glucose    Fetal Status: Fetal Heart Rate (bpm): 155         General:  Alert, oriented and cooperative. Patient is in no acute distress.  Skin: Skin is warm and dry. No rash noted.   Cardiovascular: Normal heart rate noted  Respiratory: Normal respiratory effort, no problems with respiration noted  Abdomen: Soft, gravid, appropriate for gestational age. Pain/Pressure: Absent     Pelvic:  Cervical exam deferred        Extremities: Normal range of motion.     Mental Status: Normal mood and affect. Normal behavior. Normal judgment and thought content.   Assessment   34 y.o. G3P1002 at [redacted]w[redacted]d by  10/25/2020, by Last Menstrual Period presenting for routine prenatal visit  Plan   Pregnancy#3 Problems (from  01/19/20 to present)    Problem Noted Resolved   Supervision of other normal pregnancy, antepartum 02/22/2020 by Will Bonnet, MD No   Overview Addendum 03/28/2020  3:52 PM by Will Bonnet, MD    [facility] Prenatal Labs  Dating L=6 Blood type: A/Positive/-- (05/18 1519)   Genetic Screen 1 Screen:    AFP:     Quad:     NIPS: Antibody:Negative (05/18 1519)  Anatomic [place]  Rubella: 2.44 (05/18 1519) Varicella: @VZVIGG @  GTT Third trimester:  RPR: Non Reactive (05/18 1519)   Rhogam n/a HBsAg: Negative (05/18 1519)   TDaP vaccine                       Flu Shot: HIV: Non Reactive (05/18 1519)   Baby Food                                GBS:   Contraception  Pap:06/21/2019  CBB     CS/VBAC    Support Person [employer]           Previous Version   Vaginal bleeding in pregnancy, first trimester 02/22/2020 by Will Bonnet, MD No       Preterm labor symptoms and general obstetric precautions including but not limited to vaginal bleeding, contractions, leaking of fluid and fetal movement were reviewed in detail with the patient. Please refer to After Visit Summary for other counseling recommendations.   Return in about 4 weeks (around 05/22/2020) for Routine Prenatal Appointment with Dr. Glennon Mac (if patient desires).  Prentice Docker, MD, [facility], [facility] 04/24/2020 5:43 PM

## 2020-05-02 ENCOUNTER — Ambulatory Visit: Payer: BC Managed Care – PPO | Admitting: Internal Medicine

## 2020-05-26 ENCOUNTER — Encounter: Payer: Self-pay | Admitting: Obstetrics and Gynecology

## 2020-05-26 ENCOUNTER — Other Ambulatory Visit: Payer: Self-pay

## 2020-05-26 ENCOUNTER — Ambulatory Visit (INDEPENDENT_AMBULATORY_CARE_PROVIDER_SITE_OTHER): Payer: BC Managed Care – PPO | Admitting: Obstetrics and Gynecology

## 2020-05-26 VITALS — BP 120/64 | Wt 119.0 lb

## 2020-05-26 DIAGNOSIS — Z3482 Encounter for supervision of other normal pregnancy, second trimester: Secondary | ICD-10-CM

## 2020-05-26 DIAGNOSIS — Z3A18 18 weeks gestation of pregnancy: Secondary | ICD-10-CM

## 2020-05-26 LAB — POCT URINALYSIS DIPSTICK OB
Glucose, UA: NEGATIVE
POC,PROTEIN,UA: NEGATIVE

## 2020-05-26 NOTE — Progress Notes (Signed)
  Routine Prenatal Care Visit  Subjective  Chelsea Duncan is a 34 y.o. G3P1002 at [redacted]w[redacted]d being seen today for ongoing prenatal care.  She is currently monitored for the following issues for this low-risk pregnancy and has FH: breast cancer in first degree relative; Obsessive-compulsive disorder; Social anxiety disorder; GAD (generalized anxiety disorder); Annual physical exam; Supervision of other normal pregnancy, antepartum; and Vaginal bleeding in pregnancy, first trimester on their problem list.  ----------------------------------------------------------------------------------- Patient reports no complaints.    . Vag. Bleeding: None.  Movement: Present. Leaking Fluid denies.  ----------------------------------------------------------------------------------- The following portions of the patient's history were reviewed and updated as appropriate: allergies, current medications, past family history, past medical history, past social history, past surgical history and problem list. Problem list updated.  Objective  Blood pressure 120/64, weight 119 lb (54 kg), last menstrual period 01/19/2020. Pregravid weight 115 lb (52.2 kg) Total Weight Gain 4 lb (1.814 kg) Urinalysis: Urine Protein Negative  Urine Glucose Negative  Fetal Status: Fetal Heart Rate (bpm): 150   Movement: Present     General:  Alert, oriented and cooperative. Patient is in no acute distress.  Skin: Skin is warm and dry. No rash noted.   Cardiovascular: Normal heart rate noted  Respiratory: Normal respiratory effort, no problems with respiration noted  Abdomen: Soft, gravid, appropriate for gestational age. Pain/Pressure: Absent     Pelvic:  Cervical exam deferred        Extremities: Normal range of motion.     Mental Status: Normal mood and affect. Normal behavior. Normal judgment and thought content.   Assessment   34 y.o. G3P1002 at [redacted]w[redacted]d by  10/25/2020, by Last Menstrual Period presenting for routine prenatal  visit  Plan   Pregnancy#3 Problems (from 01/19/20 to present)    Problem Noted Resolved   Supervision of other normal pregnancy, antepartum 02/22/2020 by Will Bonnet, MD No   Overview Addendum 03/28/2020  3:52 PM by Will Bonnet, MD    [facility] Prenatal Labs  Dating L=6 Blood type: A/Positive/-- (05/18 1519)   Genetic Screen 1 Screen:    AFP:     Quad:     NIPS: Antibody:Negative (05/18 1519)  Anatomic [place]  Rubella: 2.44 (05/18 1519) Varicella: @VZVIGG @  GTT Third trimester:  RPR: Non Reactive (05/18 1519)   Rhogam n/a HBsAg: Negative (05/18 1519)   TDaP vaccine                       Flu Shot: HIV: Non Reactive (05/18 1519)   Baby Food                                GBS:   Contraception  Pap:06/21/2019  CBB     CS/VBAC    Support Person [employer]           Previous Version   Vaginal bleeding in pregnancy, first trimester 02/22/2020 by Will Bonnet, MD No       Preterm labor symptoms and general obstetric precautions including but not limited to vaginal bleeding, contractions, leaking of fluid and fetal movement were reviewed in detail with the patient. Please refer to After Visit Summary for other counseling recommendations.   Return in about 2 weeks (around 06/09/2020) for Anatomy u/s and routine prenatal, Routine Prenatal Appointment (w SDJ).  Prentice Docker, MD, [facility], [facility] 05/26/2020 4:12 PM

## 2020-06-13 ENCOUNTER — Ambulatory Visit (INDEPENDENT_AMBULATORY_CARE_PROVIDER_SITE_OTHER): Payer: BC Managed Care – PPO | Admitting: Obstetrics and Gynecology

## 2020-06-13 ENCOUNTER — Ambulatory Visit (INDEPENDENT_AMBULATORY_CARE_PROVIDER_SITE_OTHER): Payer: BC Managed Care – PPO

## 2020-06-13 ENCOUNTER — Other Ambulatory Visit: Payer: Self-pay

## 2020-06-13 VITALS — BP 122/74 | Wt 120.0 lb

## 2020-06-13 DIAGNOSIS — Z3482 Encounter for supervision of other normal pregnancy, second trimester: Secondary | ICD-10-CM

## 2020-06-13 DIAGNOSIS — Z3A20 20 weeks gestation of pregnancy: Secondary | ICD-10-CM

## 2020-06-13 NOTE — Progress Notes (Signed)
Routine Prenatal Care Visit  Subjective  Chelsea Duncan is a 34 y.o. G3P1002 at [redacted]w[redacted]d being seen today for ongoing prenatal care.  She is currently monitored for the following issues for this low-risk pregnancy and has FH: breast cancer in first degree relative; Obsessive-compulsive disorder; Social anxiety disorder; GAD (generalized anxiety disorder); Annual physical exam; Supervision of other normal pregnancy, antepartum; and Vaginal bleeding in pregnancy, first trimester on their problem list.  ----------------------------------------------------------------------------------- Patient reports no complaints.    . Vag. Bleeding: None.  Movement: Present. Leaking Fluid denies.  Anatomy u/s incomplete for LVOT ----------------------------------------------------------------------------------- The following portions of the patient's history were reviewed and updated as appropriate: allergies, current medications, past family history, past medical history, past social history, past surgical history and problem list. Problem list updated.  Objective  Blood pressure 122/74, weight 120 lb (54.4 kg), last menstrual period 01/19/2020. Pregravid weight 115 lb (52.2 kg) Total Weight Gain 5 lb (2.268 kg) Urinalysis: Urine Protein    Urine Glucose    Fetal Status: Fetal Heart Rate (bpm): 152 ([place])   Movement: Present     General:  Alert, oriented and cooperative. Patient is in no acute distress.  Skin: Skin is warm and dry. No rash noted.   Cardiovascular: Normal heart rate noted  Respiratory: Normal respiratory effort, no problems with respiration noted  Abdomen: Soft, gravid, appropriate for gestational age. Pain/Pressure: Absent     Pelvic:  Cervical exam deferred        Extremities: Normal range of motion.     Mental Status: Normal mood and affect. Normal behavior. Normal judgment and thought content.   Imaging Results US OB Comp + 14 Wk  Result Date: 06/13/2020 Patient Name: Chelsea Duncan DOB:  [birth year] MRN: [id number] ULTRASOUND REPORT Location: [facility] Date of Service: 06/13/2020 Indications:Anatomy Ultrasound Findings: [name] intrauterine pregnancy is visualized with FHR at 152 BPM. Biometrics give an (U/S) Gestational age of [redacted]w[redacted]d and an (U/S) EDD of 10/23/2020; this correlates with the clinically established Estimated Date of Delivery: 10/25/2020 Fetal presentation is Breech. EFW: 396 g ( 14 oz ). Placenta: posterior fundal Grade: 1 AFI: subjectively normal. Anatomic survey is incomplete for LVOT and normal; Gender - female.  Impression: 1. [redacted]w[redacted]d Viable Singleton Intrauterine pregnancy by U/S. 2. (U/S) EDD is consistent with Clinically established Estimated Date of Delivery: 10/25/20 . 3. Normal Anatomy Scan, suboptimal view of LVOT Recommendations: 1.Clinical correlation with the patient's History and Physical Exam. 2. Repeat ultrasound for LVOT in 2-4 weeks. Gweneth Dimitri, RT There is a singleton gestation with subjectively normal amniotic fluid volume. The fetal biometry correlates with established dating. Detailed evaluation of the fetal anatomy was performed.The fetal anatomical survey appears within normal limits within the resolution of ultrasound as described above.  Not all structures were able to be visualized on today's study.  It is recommended that a follow-up ultrasound be performed to complete visualization of the unobserved structures today.  It must be noted that a normal ultrasound is unable to rule out fetal aneuploidy nor is it able to detect all possible malformations.   The ultrasound images and findings were reviewed by me and I agree with the above report. Prentice Docker, MD, [facility], [facility] 06/13/2020 10:46 AM       Assessment   34 y.o. [id number] at [redacted]w[redacted]d by  10/25/2020, by Last Menstrual Period presenting for routine prenatal visit  Plan   Pregnancy#3 Problems (from 01/19/20 to present)    Problem Noted Resolved  Supervision  of other normal pregnancy, antepartum 02/22/2020 by Will Bonnet, MD No   Overview Addendum 03/28/2020  3:52 PM by Will Bonnet, MD    [facility] Prenatal Labs  Dating L=6 Blood type: A/Positive/-- (05/18 1519)   Genetic Screen 1 Screen:    AFP:     Quad:     NIPS: Antibody:Negative (05/18 1519)  Anatomic [place]  Rubella: 2.44 (05/18 1519) Varicella: @VZVIGG @  GTT Third trimester:  RPR: Non Reactive (05/18 1519)   Rhogam n/a HBsAg: Negative (05/18 1519)   TDaP vaccine                       Flu Shot: HIV: Non Reactive (05/18 1519)   Baby Food                                GBS:   Contraception  Pap:06/21/2019  CBB     CS/VBAC    Support Person [employer]           Previous Version   Vaginal bleeding in pregnancy, first trimester 02/22/2020 by Will Bonnet, MD No       Preterm labor symptoms and general obstetric precautions including but not limited to vaginal bleeding, contractions, leaking of fluid and fetal movement were reviewed in detail with the patient. Please refer to After Visit Summary for other counseling recommendations.   Return in about 2 weeks (around 06/27/2020) for 1-2 weeks f/u anatomy and routine prenatal Dr Glennon Mac.  Prentice Docker, MD, [facility], [facility] 06/13/2020 11:10 AM

## 2020-06-28 ENCOUNTER — Other Ambulatory Visit: Payer: BC Managed Care – PPO

## 2020-06-28 ENCOUNTER — Encounter: Payer: BC Managed Care – PPO | Admitting: Obstetrics and Gynecology

## 2020-06-30 ENCOUNTER — Encounter: Payer: Self-pay | Admitting: Obstetrics and Gynecology

## 2020-06-30 ENCOUNTER — Ambulatory Visit (INDEPENDENT_AMBULATORY_CARE_PROVIDER_SITE_OTHER): Payer: BC Managed Care – PPO | Admitting: Obstetrics and Gynecology

## 2020-06-30 ENCOUNTER — Ambulatory Visit (INDEPENDENT_AMBULATORY_CARE_PROVIDER_SITE_OTHER): Payer: BC Managed Care – PPO

## 2020-06-30 ENCOUNTER — Other Ambulatory Visit: Payer: Self-pay

## 2020-06-30 VITALS — BP 120/74 | Wt 125.0 lb

## 2020-06-30 DIAGNOSIS — Z3A20 20 weeks gestation of pregnancy: Secondary | ICD-10-CM

## 2020-06-30 DIAGNOSIS — Z113 Encounter for screening for infections with a predominantly sexual mode of transmission: Secondary | ICD-10-CM

## 2020-06-30 DIAGNOSIS — Z3482 Encounter for supervision of other normal pregnancy, second trimester: Secondary | ICD-10-CM

## 2020-06-30 DIAGNOSIS — Z3A23 23 weeks gestation of pregnancy: Secondary | ICD-10-CM

## 2020-06-30 DIAGNOSIS — Z131 Encounter for screening for diabetes mellitus: Secondary | ICD-10-CM

## 2020-06-30 NOTE — Progress Notes (Signed)
Routine Prenatal Care Visit  Subjective  Chelsea Duncan is a 34 y.o. G3P1002 at [redacted]w[redacted]d being seen today for ongoing prenatal care.  She is currently monitored for the following issues for this low-risk pregnancy and has FH: breast cancer in first degree relative; Obsessive-compulsive disorder; Social anxiety disorder; GAD (generalized anxiety disorder); Annual physical exam; Supervision of other normal pregnancy, antepartum; and Vaginal bleeding in pregnancy, first trimester on their problem list.  ----------------------------------------------------------------------------------- Patient reports a bump on her left labial area. The area is not painful.     . Vag. Bleeding: None.  Movement: Present. Leaking Fluid denies.  F/u anatomy complete today.  ----------------------------------------------------------------------------------- The following portions of the patient's history were reviewed and updated as appropriate: allergies, current medications, past family history, past medical history, past social history, past surgical history and problem list. Problem list updated.  Objective  Blood pressure 120/74, weight 125 lb (56.7 kg), last menstrual period 01/19/2020. Pregravid weight 115 lb (52.2 kg) Total Weight Gain 10 lb (4.536 kg) Urinalysis: Urine Protein    Urine Glucose    Fetal Status: Fetal Heart Rate (bpm): 133 ([place])   Movement: Present     General:  Alert, oriented and cooperative. Patient is in no acute distress.  Skin: Skin is warm and dry. No rash noted.   Cardiovascular: Normal heart rate noted  Respiratory: Normal respiratory effort, no problems with respiration noted  Abdomen: Soft, gravid, appropriate for gestational age. Pain/Pressure: Absent     Pelvic:  Cervical exam deferred       No vulvar varicosity, no Bartholin's gland cyst, no hernia. No appreciable lump noted on exam.   Extremities: Normal range of motion.     Mental Status: Normal mood and affect. Normal  behavior. Normal judgment and thought content.   Female chaperone present for pelvic exam:   Imaging Results US OB Follow Up  Result Date: 06/30/2020 Patient Name: Chelsea Duncan DOB: Jun 26, 1986 MRN: [id number] ULTRASOUND REPORT Location: [facility] Date of Service: 06/30/2020 Indications: Anatomy follow up ultrasound Findings: [name] intrauterine pregnancy is visualized with FHR at 133 BPM. Fetal presentation is Cephalic. Placenta: posterior. Grade: 1 AFI: subjectively normal. Anatomic survey is complete.  The LVOT was seen today and appears normal. There is no free peritoneal fluid in the cul de sac. Impression: 1. [redacted]w[redacted]d Viable Singleton Intrauterine pregnancy previously established criteria. 2. Normal Anatomy Scan is now complete Gweneth Dimitri, RT There is a singleton gestation with subjectively normal amniotic fluid volume. Detailed evaluation of the fetal anatomy was performed. The fetal anatomical survey appears within normal limits within the resolution of ultrasound as described above.  Not all structures were attempted to be visualized as they were noted on a previous exam. See the prior exam for full details.  It must be noted that a normal ultrasound is unable to rule out fetal aneuploidy nor is it able to detect all possible malformations. The ultrasound images and findings were reviewed by me and I agree with the above report. Prentice Docker, MD, [facility], [facility] 06/30/2020 3:31 PM       Assessment   34 y.o. G3P1002 at [redacted]w[redacted]d by  10/25/2020, by Last Menstrual Period presenting for routine prenatal visit  Plan   Pregnancy#3 Problems (from 01/19/20 to present)    Problem Noted Resolved   Supervision of other normal pregnancy, antepartum 02/22/2020 by Will Bonnet, MD No   Overview Addendum 03/28/2020  3:52 PM by Will Bonnet, MD    Clinic  [facility] Prenatal [facility]  Dating L=6 Blood type: A/Positive/-- (05/18 1519)   Genetic Screen 1  Screen:    AFP:     Quad:     NIPS: Antibody:Negative (05/18 1519)  Anatomic [place]  Rubella: 2.44 (05/18 1519) Varicella: @VZVIGG @  GTT Third trimester:  RPR: Non Reactive (05/18 1519)   Rhogam n/a HBsAg: Negative (05/18 1519)   TDaP vaccine                       Flu Shot: HIV: Non Reactive (05/18 1519)   Baby Food                                GBS:   Contraception  Pap:06/21/2019  CBB     CS/VBAC    Support Person [employer]           Previous Version   Vaginal bleeding in pregnancy, first trimester 02/22/2020 by Will Bonnet, MD No       Preterm labor symptoms and general obstetric precautions including but not limited to vaginal bleeding, contractions, leaking of fluid and fetal movement were reviewed in detail with the patient. Please refer to After Visit Summary for other counseling recommendations.   Return in about 4 weeks (around 07/28/2020) for 28 week labs and routine prenatal.  Prentice Docker, MD, [place], [facility] 06/30/2020 3:50 PM

## 2020-07-11 ENCOUNTER — Encounter: Payer: BC Managed Care – PPO | Admitting: Obstetrics and Gynecology

## 2020-07-31 ENCOUNTER — Ambulatory Visit (INDEPENDENT_AMBULATORY_CARE_PROVIDER_SITE_OTHER): Payer: BC Managed Care – PPO | Admitting: Obstetrics and Gynecology

## 2020-07-31 ENCOUNTER — Other Ambulatory Visit: Payer: BC Managed Care – PPO

## 2020-07-31 ENCOUNTER — Encounter: Payer: Self-pay | Admitting: Obstetrics and Gynecology

## 2020-07-31 ENCOUNTER — Other Ambulatory Visit: Payer: Self-pay

## 2020-07-31 VITALS — BP 122/74 | Wt 129.0 lb

## 2020-07-31 DIAGNOSIS — Z131 Encounter for screening for diabetes mellitus: Secondary | ICD-10-CM

## 2020-07-31 DIAGNOSIS — Z3482 Encounter for supervision of other normal pregnancy, second trimester: Secondary | ICD-10-CM

## 2020-07-31 DIAGNOSIS — O209 Hemorrhage in early pregnancy, unspecified: Secondary | ICD-10-CM

## 2020-07-31 DIAGNOSIS — Z3483 Encounter for supervision of other normal pregnancy, third trimester: Secondary | ICD-10-CM

## 2020-07-31 DIAGNOSIS — Z113 Encounter for screening for infections with a predominantly sexual mode of transmission: Secondary | ICD-10-CM

## 2020-07-31 NOTE — Progress Notes (Signed)
Routine Prenatal Care Visit  Subjective  Chelsea Duncan is a 34 y.o. G3P2002 at [redacted]w[redacted]d being seen today for ongoing prenatal care.  She is currently monitored for the following issues for this low-risk pregnancy and has FH: breast cancer in first degree relative; Obsessive-compulsive disorder; Social anxiety disorder; GAD (generalized anxiety disorder); Annual physical exam; Supervision of other normal pregnancy, antepartum; and Vaginal bleeding in pregnancy, first trimester on their problem list.  ----------------------------------------------------------------------------------- Patient reports no complaints.   Contractions: Not present. Vag. Bleeding: None.  Movement: Present. Leaking Fluid denies.  ----------------------------------------------------------------------------------- The following portions of the patient's history were reviewed and updated as appropriate: allergies, current medications, past family history, past medical history, past social history, past surgical history and problem list. Problem list updated.  Objective  Blood pressure 122/74, weight 129 lb (58.5 kg), last menstrual period 01/19/2020. Pregravid weight 115 lb (52.2 kg) Total Weight Gain 14 lb (6.35 kg) Urinalysis: Urine Protein    Urine Glucose    Fetal Status: Fetal Heart Rate (bpm): 125 Fundal Height: 26 cm Movement: Present     General:  Alert, oriented and cooperative. Patient is in no acute distress.  Skin: Skin is warm and dry. No rash noted.   Cardiovascular: Normal heart rate noted  Respiratory: Normal respiratory effort, no problems with respiration noted  Abdomen: Soft, gravid, appropriate for gestational age. Pain/Pressure: Absent     Pelvic:  Cervical exam deferred        Extremities: Normal range of motion.     Mental Status: Normal mood and affect. Normal behavior. Normal judgment and thought content.   Assessment   34 y.o. [id number] at [redacted]w[redacted]d by  10/25/2020, by Last Menstrual Period  presenting for routine prenatal visit  Plan   Pregnancy#3 Problems (from 01/19/20 to present)    Problem Noted Resolved   Supervision of other normal pregnancy, antepartum 02/22/2020 by Will Bonnet, MD No   Overview Addendum 03/28/2020  3:52 PM by Will Bonnet, MD    Clinic [facility] Prenatal [facility]  Dating L=6 Blood type: A/Positive/-- (05/18 1519)   Genetic Screen 1 Screen:    AFP:     Quad:     NIPS: Antibody:Negative (05/18 1519)  Anatomic [place]  Rubella: 2.44 (05/18 1519) Varicella: @VZVIGG @  GTT Third trimester:  RPR: Non Reactive (05/18 1519)   Rhogam n/a HBsAg: Negative (05/18 1519)   TDaP vaccine                       Flu Shot: HIV: Non Reactive (05/18 1519)   Baby Food                                GBS:   Contraception  Pap:06/21/2019  CBB     CS/VBAC    Support Person [employer]           Previous Version   Vaginal bleeding in pregnancy, first trimester 02/22/2020 by Will Bonnet, MD No       Preterm labor symptoms and general obstetric precautions including but not limited to vaginal bleeding, contractions, leaking of fluid and fetal movement were reviewed in detail with the patient. Please refer to After Visit Summary for other counseling recommendations.   - 28 wk labs today  Return in about 2 weeks (around 08/14/2020) for Routine Prenatal Appointment (may make multiple 2 week appts).  Prentice Docker, MD, [facility], [facility] 07/31/2020 8:32 AM

## 2020-08-01 LAB — 28 WEEK RH+PANEL
Basophils Absolute: 0 10*3/uL (ref 0.0–0.2)
Basos: 0 %
EOS (ABSOLUTE): 0.1 10*3/uL (ref 0.0–0.4)
Eos: 1 %
Gestational Diabetes Screen: 91 mg/dL (ref 65–139)
HIV Screen 4th Generation wRfx: NONREACTIVE
Hematocrit: 36.2 % (ref 34.0–46.6)
Hemoglobin: 12.2 g/dL (ref 11.1–15.9)
Immature Grans (Abs): 0.1 10*3/uL (ref 0.0–0.1)
Immature Granulocytes: 1 %
Lymphocytes Absolute: 1.5 10*3/uL (ref 0.7–3.1)
Lymphs: 16 %
MCH: 33.4 pg — ABNORMAL HIGH (ref 26.6–33.0)
MCHC: 33.7 g/dL (ref 31.5–35.7)
MCV: 99 fL — ABNORMAL HIGH (ref 79–97)
Monocytes Absolute: 0.5 10*3/uL (ref 0.1–0.9)
Monocytes: 5 %
Neutrophils Absolute: 7.2 10*3/uL — ABNORMAL HIGH (ref 1.4–7.0)
Neutrophils: 77 %
Platelets: 234 10*3/uL (ref 150–450)
RBC: 3.65 x10E6/uL — ABNORMAL LOW (ref 3.77–5.28)
RDW: 12.6 % (ref 11.7–15.4)
RPR Ser Ql: NONREACTIVE
WBC: 9.4 10*3/uL (ref 3.4–10.8)

## 2020-08-17 ENCOUNTER — Ambulatory Visit (INDEPENDENT_AMBULATORY_CARE_PROVIDER_SITE_OTHER): Payer: BC Managed Care – PPO | Admitting: Obstetrics and Gynecology

## 2020-08-17 ENCOUNTER — Other Ambulatory Visit: Payer: Self-pay

## 2020-08-17 ENCOUNTER — Encounter: Payer: Self-pay | Admitting: Obstetrics and Gynecology

## 2020-08-17 VITALS — BP 126/78 | Wt 134.0 lb

## 2020-08-17 DIAGNOSIS — Z3483 Encounter for supervision of other normal pregnancy, third trimester: Secondary | ICD-10-CM

## 2020-08-17 DIAGNOSIS — Z23 Encounter for immunization: Secondary | ICD-10-CM

## 2020-08-17 DIAGNOSIS — Z3A30 30 weeks gestation of pregnancy: Secondary | ICD-10-CM

## 2020-08-17 NOTE — Progress Notes (Signed)
  Routine Prenatal Care Visit  Subjective  Chelsea Duncan is a 34 y.o. G3P2002 at [redacted]w[redacted]d being seen today for ongoing prenatal care.  She is currently monitored for the following issues for this low-risk pregnancy and has FH: breast cancer in first degree relative; Obsessive-compulsive disorder; Social anxiety disorder; GAD (generalized anxiety disorder); Annual physical exam; Supervision of other normal pregnancy, antepartum; and Vaginal bleeding in pregnancy, first trimester on their problem list.  ----------------------------------------------------------------------------------- Patient reports no complaints.   Contractions: Not present. Vag. Bleeding: None.  Movement: Present. Leaking Fluid denies.  ----------------------------------------------------------------------------------- The following portions of the patient's history were reviewed and updated as appropriate: allergies, current medications, past family history, past medical history, past social history, past surgical history and problem list. Problem list updated.  Objective  Blood pressure 126/78, weight 134 lb (60.8 kg), last menstrual period 01/19/2020. Pregravid weight 115 lb (52.2 kg) Total Weight Gain 19 lb (8.618 kg) Urinalysis: Urine Protein    Urine Glucose    Fetal Status: Fetal Heart Rate (bpm): 120 Fundal Height: 29 cm Movement: Present     General:  Alert, oriented and cooperative. Patient is in no acute distress.  Skin: Skin is warm and dry. No rash noted.   Cardiovascular: Normal heart rate noted  Respiratory: Normal respiratory effort, no problems with respiration noted  Abdomen: Soft, gravid, appropriate for gestational age. Pain/Pressure: Absent     Pelvic:  Cervical exam deferred        Extremities: Normal range of motion.  Edema: None  Mental Status: Normal mood and affect. Normal behavior. Normal judgment and thought content.   Assessment   34 y.o. [id number] at [redacted]w[redacted]d by  10/25/2020, by Last Menstrual  Period presenting for routine prenatal visit  Plan   Pregnancy#3 Problems (from 01/19/20 to present)    Problem Noted Resolved   Supervision of other normal pregnancy, antepartum 02/22/2020 by Will Bonnet, MD No   Overview Addendum 08/17/2020  9:39 AM by Will Bonnet, MD    Clinic [facility]atal [facility]  Dating L=6 Blood type: A/Positive/-- (05/18 1519)   Genetic Screen NIPS: diploid XY Antibody:Negative (05/18 1519)  Anatomic [place] complete Rubella: 2.44 (05/18 1519)  Varicella: Immune  GTT Third trimester: nml RPR: Non Reactive (05/18 1519)   Rhogam n/a HBsAg: Negative (05/18 1519)   TDaP vaccine 08/17/20          Flu Shot: HIV: Non Reactive (05/18 1519)   Baby Food                                GBS:   Contraception  Pap:06/21/2019  CBB     CS/VBAC    Support Person Renne Musca           Previous Version   Vaginal bleeding in pregnancy, first trimester 02/22/2020 by Will Bonnet, MD No       Preterm labor symptoms and general obstetric precautions including but not limited to vaginal bleeding, contractions, leaking of fluid and fetal movement were reviewed in detail with the patient. Please refer to After Visit Summary for other counseling recommendations.   - TDaP today  Return in about 2 weeks (around 08/31/2020) for Routine Prenatal Appointment, may add several 2 week apps (no further than  6wks from now).   Prentice Docker, MD, [facility], [facility] 08/17/2020 9:40 AM

## 2020-08-29 ENCOUNTER — Other Ambulatory Visit: Payer: Self-pay

## 2020-08-29 ENCOUNTER — Encounter: Payer: Self-pay | Admitting: Obstetrics and Gynecology

## 2020-08-29 ENCOUNTER — Ambulatory Visit (INDEPENDENT_AMBULATORY_CARE_PROVIDER_SITE_OTHER): Payer: BC Managed Care – PPO | Admitting: Obstetrics and Gynecology

## 2020-08-29 VITALS — BP 122/74 | Wt 135.0 lb

## 2020-08-29 DIAGNOSIS — O99343 Other mental disorders complicating pregnancy, third trimester: Secondary | ICD-10-CM

## 2020-08-29 DIAGNOSIS — Z3480 Encounter for supervision of other normal pregnancy, unspecified trimester: Secondary | ICD-10-CM

## 2020-08-29 DIAGNOSIS — Z3A31 31 weeks gestation of pregnancy: Secondary | ICD-10-CM

## 2020-08-29 DIAGNOSIS — F32A Depression, unspecified: Secondary | ICD-10-CM | POA: Insufficient documentation

## 2020-08-29 DIAGNOSIS — O99340 Other mental disorders complicating pregnancy, unspecified trimester: Secondary | ICD-10-CM | POA: Insufficient documentation

## 2020-08-29 HISTORY — DX: Other mental disorders complicating pregnancy, unspecified trimester: O99.340

## 2020-08-29 HISTORY — DX: Depression, unspecified: F32.A

## 2020-08-29 MED ORDER — SERTRALINE HCL 50 MG PO TABS: 50.0000 mg | ORAL_TABLET | Freq: Every day | ORAL | 1 refills | Status: DC

## 2020-08-29 NOTE — Progress Notes (Signed)
Routine Prenatal Care Visit  Subjective  Chelsea Duncan is a 34 y.o. G3P2002 at [redacted]w[redacted]d being seen today for ongoing prenatal care.  She is currently monitored for the following issues for this low-risk pregnancy and has FH: breast cancer in first degree relative; Obsessive-compulsive disorder; Social anxiety disorder; GAD (generalized anxiety disorder); Annual physical exam; Supervision of other normal pregnancy, antepartum; Vaginal bleeding in pregnancy, first trimester; and Depression during pregnancy on their problem list.  ----------------------------------------------------------------------------------- Patient reports depressive/anxious symptoms, she has been crying more recently.  She has a lot of personal/professional stressors at the moment. Denies SI/HI.   Contractions: Not present. Vag. Bleeding: None.  Movement: Present. Leaking Fluid denies.  ----------------------------------------------------------------------------------- The following portions of the patient's history were reviewed and updated as appropriate: allergies, current medications, past family history, past medical history, past social history, past surgical history and problem list. Problem list updated.  Objective  Blood pressure 122/74, weight 135 lb (61.2 kg), last menstrual period 01/19/2020. Pregravid weight 115 lb (52.2 kg) Total Weight Gain 20 lb (9.072 kg) Urinalysis: Urine Protein    Urine Glucose    Fetal Status: Fetal Heart Rate (bpm): 125 Fundal Height: 31 cm Movement: Present  Presentation: Vertex  General:  Alert, oriented and cooperative. Patient is in no acute distress.  Skin: Skin is warm and dry. No rash noted.   Cardiovascular: Normal heart rate noted  Respiratory: Normal respiratory effort, no problems with respiration noted  Abdomen: Soft, gravid, appropriate for gestational age. Pain/Pressure: Absent     Pelvic:  Cervical exam deferred        Extremities: Normal range of motion.  Edema: None   Mental Status: Normal mood and affect. Normal behavior. Normal judgment and thought content.   Assessment   34 y.o. [id number] at [redacted]w[redacted]d by  10/25/2020, by Last Menstrual Period presenting for routine prenatal visit  Plan   Pregnancy#3 Problems (from 01/19/20 to present)    Problem Noted Resolved   Depression during pregnancy 08/29/2020 by Will Bonnet, MD No   Supervision of other normal pregnancy, antepartum 02/22/2020 by Will Bonnet, MD No   Overview Addendum 08/17/2020  9:39 AM by Will Bonnet, MD    Clinic [facility]atal [facility]  Dating L=6 Blood type: A/Positive/-- (05/18 1519)   Genetic Screen NIPS: diploid XY Antibody:Negative (05/18 1519)  Anatomic [place] complete Rubella: 2.44 (05/18 1519)  Varicella: Immune  GTT Third trimester: nml RPR: Non Reactive (05/18 1519)   Rhogam n/a HBsAg: Negative (05/18 1519)   TDaP vaccine 08/17/20          Flu Shot: HIV: Non Reactive (05/18 1519)   Baby Food                                GBS:   Contraception  Pap:06/21/2019  CBB     CS/VBAC    Support Person Renne Musca           Previous Version   Vaginal bleeding in pregnancy, first trimester 02/22/2020 by Will Bonnet, MD No       Preterm labor symptoms and general obstetric precautions including but not limited to vaginal bleeding, contractions, leaking of fluid and fetal movement were reviewed in detail with the patient. Please refer to After Visit Summary for other counseling recommendations.   - EPDS 19 today, no SI/HI noted.  Start zoloft. Discussed some dreams she had on this medication before. Can start this SSRI  or another. She is OK with trying zoloft first.  Recommended counseling.  Return in about 2 weeks (around 09/12/2020) for Routine Prenatal Appointment.   Prentice Docker, MD, [facility], [facility] 08/29/2020 10:59 AM

## 2020-09-12 ENCOUNTER — Encounter: Payer: Self-pay | Admitting: Obstetrics and Gynecology

## 2020-09-12 ENCOUNTER — Ambulatory Visit (INDEPENDENT_AMBULATORY_CARE_PROVIDER_SITE_OTHER): Payer: BC Managed Care – PPO | Admitting: Obstetrics and Gynecology

## 2020-09-12 ENCOUNTER — Other Ambulatory Visit: Payer: Self-pay

## 2020-09-12 VITALS — BP 120/76 | Wt 136.0 lb

## 2020-09-12 DIAGNOSIS — Z23 Encounter for immunization: Secondary | ICD-10-CM

## 2020-09-12 DIAGNOSIS — Z3A33 33 weeks gestation of pregnancy: Secondary | ICD-10-CM

## 2020-09-12 DIAGNOSIS — F32A Depression, unspecified: Secondary | ICD-10-CM

## 2020-09-12 DIAGNOSIS — O99343 Other mental disorders complicating pregnancy, third trimester: Secondary | ICD-10-CM

## 2020-09-12 DIAGNOSIS — Z3480 Encounter for supervision of other normal pregnancy, unspecified trimester: Secondary | ICD-10-CM

## 2020-09-12 NOTE — Progress Notes (Signed)
Routine Prenatal Care Visit  Subjective  Chelsea Duncan is a 34 y.o. G3P2002 at [redacted]w[redacted]d being seen today for ongoing prenatal care.  She is currently monitored for the following issues for this low-risk pregnancy and has FH: breast cancer in first degree relative; Obsessive-compulsive disorder; Social anxiety disorder; GAD (generalized anxiety disorder); Annual physical exam; Supervision of other normal pregnancy, antepartum; Vaginal bleeding in pregnancy, first trimester; and Depression during pregnancy on their problem list.  ----------------------------------------------------------------------------------- Patient reports no complaints.   Contractions: Irritability. Vag. Bleeding: None.  Movement: Present. Leaking Fluid denies.  Started zoloft 25 mg 4 days ago. States she already is feeling somewhat better. EPDS today is 17. Desires cervical check ----------------------------------------------------------------------------------- The following portions of the patient's history were reviewed and updated as appropriate: allergies, current medications, past family history, past medical history, past social history, past surgical history and problem list. Problem list updated.  Objective  Blood pressure 120/76, weight 136 lb (61.7 kg), last menstrual period 01/19/2020. Pregravid weight 115 lb (52.2 kg) Total Weight Gain 21 lb (9.526 kg) Urinalysis: Urine Protein    Urine Glucose    Fetal Status: Fetal Heart Rate (bpm): 130 Fundal Height: 33 cm Movement: Present     General:  Alert, oriented and cooperative. Patient is in no acute distress.  Skin: Skin is warm and dry. No rash noted.   Cardiovascular: Normal heart rate noted  Respiratory: Normal respiratory effort, no problems with respiration noted  Abdomen: Soft, gravid, appropriate for gestational age. Pain/Pressure: Absent     Pelvic:  Cervical exam performed Dilation: Fingertip Effacement (%): 30 Station: Ballotable  Extremities: Normal  range of motion.  Edema: None  Mental Status: Normal mood and affect. Normal behavior. Normal judgment and thought content.   Assessment   34 y.o. [id number] at [redacted]w[redacted]d by  10/25/2020, by Last Menstrual Period presenting for routine prenatal visit  Plan   Pregnancy#3 Problems (from 01/19/20 to present)    Problem Noted Resolved   Depression during pregnancy 08/29/2020 by Will Bonnet, MD No   Supervision of other normal pregnancy, antepartum 02/22/2020 by Will Bonnet, MD No   Overview Addendum 08/17/2020  9:39 AM by Will Bonnet, MD    Clinic [facility]  Dating L=6 Blood type: A/Positive/-- (05/18 1519)   Genetic Screen NIPS: diploid XY Antibody:Negative (05/18 1519)  Anatomic [place] complete Rubella: 2.44 (05/18 1519)  Varicella: Immune  GTT Third trimester: nml RPR: Non Reactive (05/18 1519)   Rhogam n/a HBsAg: Negative (05/18 1519)   TDaP vaccine 08/17/20          Flu Shot: HIV: Non Reactive (05/18 1519)   Baby Food                                GBS:   Contraception  Pap:06/21/2019  CBB     CS/VBAC    Support Person Renne Musca           Previous Version   Vaginal bleeding in pregnancy, first trimester 02/22/2020 by Will Bonnet, MD No       Preterm labor symptoms and general obstetric precautions including but not limited to vaginal bleeding, contractions, leaking of fluid and fetal movement were reviewed in detail with the patient. Please refer to After Visit Summary for other counseling recommendations.   - flu vaccine today  Return in about 2 weeks (around 09/26/2020) for Routine Prenatal Appointment with Dr. Glennon Mac only.  Prentice Docker, MD, [facility], [facility] 09/12/2020 3:12 PM

## 2020-10-02 ENCOUNTER — Other Ambulatory Visit (HOSPITAL_COMMUNITY)
Admission: RE | Admit: 2020-10-02 | Discharge: 2020-10-02 | Disposition: A | Discharge status: Home / Self Care | Payer: BC Managed Care – PPO | Source: Ambulatory Visit | Attending: Obstetrics and Gynecology | Admitting: Obstetrics and Gynecology

## 2020-10-02 ENCOUNTER — Encounter: Payer: Self-pay | Admitting: Obstetrics and Gynecology

## 2020-10-02 ENCOUNTER — Ambulatory Visit (INDEPENDENT_AMBULATORY_CARE_PROVIDER_SITE_OTHER): Payer: BC Managed Care – PPO | Admitting: Obstetrics and Gynecology

## 2020-10-02 ENCOUNTER — Other Ambulatory Visit: Payer: Self-pay

## 2020-10-02 ENCOUNTER — Ambulatory Visit
Admission: EM | Admit: 2020-10-02 | Discharge: 2020-10-02 | Disposition: A | Discharge status: Home / Self Care | Payer: BC Managed Care – PPO | Attending: Family Medicine | Admitting: Family Medicine

## 2020-10-02 VITALS — BP 122/70 | Wt 140.0 lb

## 2020-10-02 DIAGNOSIS — Z3A36 36 weeks gestation of pregnancy: Secondary | ICD-10-CM | POA: Diagnosis present

## 2020-10-02 DIAGNOSIS — J069 Acute upper respiratory infection, unspecified: Secondary | ICD-10-CM | POA: Diagnosis present

## 2020-10-02 DIAGNOSIS — Z79899 Other long term (current) drug therapy: Secondary | ICD-10-CM | POA: Diagnosis not present

## 2020-10-02 DIAGNOSIS — Z20822 Contact with and (suspected) exposure to covid-19: Secondary | ICD-10-CM | POA: Diagnosis not present

## 2020-10-02 DIAGNOSIS — O99519 Diseases of the respiratory system complicating pregnancy, unspecified trimester: Secondary | ICD-10-CM | POA: Diagnosis not present

## 2020-10-02 DIAGNOSIS — Z8616 Personal history of COVID-19: Secondary | ICD-10-CM | POA: Insufficient documentation

## 2020-10-02 DIAGNOSIS — O99343 Other mental disorders complicating pregnancy, third trimester: Secondary | ICD-10-CM

## 2020-10-02 DIAGNOSIS — Z3483 Encounter for supervision of other normal pregnancy, third trimester: Secondary | ICD-10-CM

## 2020-10-02 DIAGNOSIS — F32A Depression, unspecified: Secondary | ICD-10-CM

## 2020-10-02 LAB — RESP PANEL BY RT-PCR (FLU A&B, COVID) ARPGX2
Influenza A by PCR: NEGATIVE
Influenza B by PCR: NEGATIVE
SARS Coronavirus 2 by RT PCR: NEGATIVE

## 2020-10-02 LAB — GROUP A STREP BY PCR: Group A Strep by PCR: NOT DETECTED

## 2020-10-02 NOTE — Discharge Instructions (Signed)
Rest. Fluids.  OTC medications per OB as desired.  Take care  Dr. Adriana Simas

## 2020-10-02 NOTE — Progress Notes (Signed)
Routine Prenatal Care Visit  Subjective  Chelsea Duncan is a 34 y.o. G3P2002 at [redacted]w[redacted]d being seen today for ongoing prenatal care.  She is currently monitored for the following issues for this low-risk pregnancy and has FH: breast cancer in first degree relative; Obsessive-compulsive disorder; Social anxiety disorder; GAD (generalized anxiety disorder); Annual physical exam; Supervision of other normal pregnancy, antepartum; Vaginal bleeding in pregnancy, first trimester; and Depression during pregnancy on their problem list.  ----------------------------------------------------------------------------------- Patient reports no complaints.   Contractions: Irregular. Vag. Bleeding: None.  Movement: Present. Leaking Fluid denies.  Feeling much better from a mood standpoint. URI sx. Negative flu and covid testing today. ----------------------------------------------------------------------------------- The following portions of the patient's history were reviewed and updated as appropriate: allergies, current medications, past family history, past medical history, past social history, past surgical history and problem list. Problem list updated.  Objective  Blood pressure 122/70, weight 140 lb (63.5 kg), last menstrual period 01/19/2020. Pregravid weight 115 lb (52.2 kg) Total Weight Gain 25 lb (11.3 kg) Urinalysis: Urine Protein    Urine Glucose    Fetal Status: Fetal Heart Rate (bpm): 135 Fundal Height: 36 cm Movement: Present     General:  Alert, oriented and cooperative. Patient is in no acute distress.  Skin: Skin is warm and dry. No rash noted.   Cardiovascular: Normal heart rate noted  Respiratory: Normal respiratory effort, no problems with respiration noted  Abdomen: Soft, gravid, appropriate for gestational age. Pain/Pressure: Absent     Pelvic:  Cervical exam performed Dilation: 1 Effacement (%): 40 Station: -3  Extremities: Normal range of motion.  Edema: None  Mental Status:  Normal mood and affect. Normal behavior. Normal judgment and thought content.   Assessment   34 y.o. [id number] at [redacted]w[redacted]d by  10/25/2020, by Last Menstrual Period presenting for routine prenatal visit  Plan   Pregnancy#3 Problems (from 01/19/20 to present)    Problem Noted Resolved   Depression during pregnancy 08/29/2020 by Will Bonnet, MD No   Supervision of other normal pregnancy, antepartum 02/22/2020 by Will Bonnet, MD No   Overview Addendum 08/17/2020  9:39 AM by Will Bonnet, MD    Clinic [facility]  Dating L=6 Blood type: A/Positive/-- (05/18 1519)   Genetic Screen NIPS: diploid XY Antibody:Negative (05/18 1519)  Anatomic [place] complete Rubella: 2.44 (05/18 1519)  Varicella: Immune  GTT Third trimester: nml RPR: Non Reactive (05/18 1519)   Rhogam n/a HBsAg: Negative (05/18 1519)   TDaP vaccine 08/17/20          Flu Shot: HIV: Non Reactive (05/18 1519)   Baby Food                                GBS:   Contraception  Pap:06/21/2019  CBB     CS/VBAC    Support Person Renne Musca           Previous Version   Vaginal bleeding in pregnancy, first trimester 02/22/2020 by Will Bonnet, MD No       Preterm labor symptoms and general obstetric precautions including but not limited to vaginal bleeding, contractions, leaking of fluid and fetal movement were reviewed in detail with the patient. Please refer to After Visit Summary for other counseling recommendations.   Return in about 1 week (around 10/09/2020) for Routine Prenatal Appointment (may make 3 weekly appts with Dr. Glennon Mac).   Prentice Docker, MD, [facility],  [facility] 10/02/2020 3:14 PM

## 2020-10-02 NOTE — ED Provider Notes (Signed)
[facility]    CSN: [id number] Arrival date & time: 10/02/20  12/16/37      History   Chief Complaint Chief Complaint  Patient presents with  . Nasal Congestion   HPI   34 year old female presents with respiratory symptoms.  Started on Friday.  Reports runny nose, headache, cough, fatigue.  Said a low-grade temperature.  Also complains of sore throat.  She said other family members that have been sick as well.  All have been negative for Covid.  No fever.  She has taken Sudafed without relief.  Patient is currently pregnant and is concerned about taking over-the-counter medications.  No other associated symptoms.  No other complaints.  Past Medical History:  Diagnosis Date  . Allergy   . Anxiety   . Bone tumor (benign)    tumor vs cyst removed btwn age [age] y.o.   . BRCA negative 03/2016   MyRisk neg except NBN VUS  . Chicken pox   . Complication of anesthesia   . Depression during pregnancy 08/29/2020  . Family history of breast cancer 03/2016   IBIS=18.4%  She has been tested for at least BRCA and was negative 17-Dec-2015). Tyrer-Cuzick (lifetime) was ~18%.   Marland Kitchen PONV (postoperative nausea and vomiting)   . Pyelonephritis   . S/P skin biopsy    neck normal   . UTI (urinary tract infection)     Patient Active Problem List   Diagnosis Date Noted  . Depression during pregnancy 08/29/2020  . Supervision of other normal pregnancy, antepartum 02/22/2020  . Vaginal bleeding in pregnancy, first trimester 02/22/2020  . Annual physical exam 10/29/2019  . Obsessive-compulsive disorder 05/11/2019  . Social anxiety disorder 05/11/2019  . GAD (generalized anxiety disorder) 05/11/2019  . FH: breast cancer in first degree relative 07/01/2013    Past Surgical History:  Procedure Laterality Date  . BONE CYST EXCISION     right foot.    OB History    Gravida  3   Para  2   Term  2   Preterm  0   AB  0   Living  2     SAB  0   IAB  0   Ectopic  0    Multiple  0   Live Births  2            Home Medications    Prior to Admission medications   Medication Sig Start Date End Date Taking? Authorizing Provider  ALPRAZolam Duanne Moron) 0.25 MG tablet Take 0.5 tablets (0.125 mg total) by mouth daily as needed for anxiety. Patient not taking: Reported on 02/22/2020 05/11/19   McLean-Scocuzza, Nino Glow, MD  busPIRone (BUSPAR) 10 MG tablet 10 mg in the am and 5 mg at lunch Patient not taking: Reported on 02/22/2020 10/29/19   McLean-Scocuzza, Nino Glow, MD  EPINEPHrine (EPIPEN 2-PAK) 0.3 mg/0.3 mL IJ SOAJ injection Inject into the muscle once.    [provider]  Prenatal Vit-Fe Fumarate-FA (PRENATAL VITAMIN PO) Take by mouth.    [provider]  sertraline (ZOLOFT) 50 MG tablet Take 1 tablet (50 mg total) by mouth daily. 08/29/20   Will Bonnet, MD    Family History Family History  Problem Relation Age of Onset  . Cancer Mother 21       breast / died at 50 in 16-Dec-2009  . Hypertension Mother   . Depression Mother   . Arthritis Father   . Depression Father  committed suicide in 2016  . Hypertension Father   . Hypertension Sister   . Heart disease Sister        heart murmur  . Birth defects Sister   . Depression Sister   . Arthritis Maternal Grandmother   . Other Maternal Grandmother        elevated tau protein; had corticobasal degneration   . Cancer Paternal Grandmother        lung  . Emphysema Paternal Grandfather     Social History Social History   Tobacco Use  . Smoking status: Never Smoker  . Smokeless tobacco: Never Used  Vaping Use  . Vaping Use: Never used  Substance Use Topics  . Alcohol use: Yes    Comment: social  . Drug use: No     Allergies   Cefadroxil, Fire ant, Septra [sulfamethoxazole-trimethoprim], and Zoloft [sertraline hcl]   Review of Systems Review of Systems Per HPI  Physical Exam Triage Vital Signs ED Triage Vitals  Enc Vitals Group     BP 10/02/20 1159 130/76      Pulse Rate 10/02/20 1159 94     Resp 10/02/20 1159 18     Temp 10/02/20 1159 98.2 F (36.8 C)     Temp Source 10/02/20 1159 Oral     SpO2 10/02/20 1159 100 %     Weight --      Height --      Head Circumference --      Peak Flow --      Pain Score 10/02/20 1153 4     Pain Loc --      Pain Edu? --      Excl. in [place]? --    Updated Vital Signs BP 130/76 (BP Location: Left Arm)   Pulse 94   Temp 98.2 F (36.8 C) (Oral)   Resp 18   LMP 01/19/2020   SpO2 100%   Visual Acuity Right Eye Distance:   Left Eye Distance:   Bilateral Distance:    Right Eye Near:   Left Eye Near:    Bilateral Near:     Physical Exam Constitutional:      General: She is not in acute distress.    Appearance: Normal appearance.  HENT:     Head: Normocephalic and atraumatic.     Right Ear: Tympanic membrane normal.     Left Ear: Tympanic membrane normal.     Nose: Congestion present.  Eyes:     General:        Right eye: No discharge.        Left eye: No discharge.     Conjunctiva/sclera: Conjunctivae normal.  Cardiovascular:     Rate and Rhythm: Normal rate and regular rhythm.  Pulmonary:     Effort: Pulmonary effort is normal.     Breath sounds: Normal breath sounds. No wheezing, rhonchi or rales.  Neurological:     Mental Status: She is alert.  Psychiatric:        Mood and Affect: Mood normal.        Behavior: Behavior normal.    UC Treatments / Results  Labs (all labs ordered are listed, but only abnormal results are displayed) Labs Reviewed  RESP PANEL BY RT-PCR (FLU A&B, COVID) ARPGX2  GROUP A STREP BY PCR    EKG   Radiology No results found.  Procedures Procedures (including critical care time)  Medications Ordered in UC Medications - No data to display  Initial Impression / Assessment and  Plan / UC Course  I have reviewed the triage vital signs and the nursing notes.  Pertinent labs & imaging results that were available during my care of the patient were reviewed  by me and considered in my medical decision making (see chart for details).    34 year old female presents with viral URI with cough.  Covid, flu, strep testing negative.  Advised supportive care and over-the-counter medications as recommended by her OB/GYN.  Final Clinical Impressions(s) / UC Diagnoses   Final diagnoses:  Viral URI with cough     Discharge Instructions     Rest. Fluids.  OTC medications per OB as desired.  Take care  Dr. Lacinda Axon    ED Prescriptions    None     PDMP not reviewed this encounter.   Coral Spikes, [place] 10/02/20 1355

## 2020-10-02 NOTE — ED Triage Notes (Signed)
Pt c/o congestion, runny nose, HA, productive cough with yellow sputum, fatigue, malaise onset last Thursday, reports x3 negative home COVID tests since Thursday. Reports low-grade fever on Saturday.Also c/o sore throat.  Took Sudafed (last dose on Saturday evening) and reports her BP began to evaluate. Took tylenol this morning.  Denies n/v/d, loss of taste/smell, SOB.  Pt had COVID infection in February.  States children have had cough and URI symptoms since last week-all family members tested negative for COVID. Oropharynx mildly erythemic

## 2020-10-04 LAB — CERVICOVAGINAL ANCILLARY ONLY
Chlamydia: NEGATIVE
Comment: NEGATIVE
Comment: NORMAL
Neisseria Gonorrhea: NEGATIVE

## 2020-10-06 LAB — STREP GP B CULTURE+RFLX: Strep Gp B Culture+Rflx: NEGATIVE

## 2020-10-07 NOTE — L&D Delivery Note (Signed)
Delivery Note At 3:20 PM a viable female was delivered via Vaginal, Spontaneous (Presentation: Left Occiput Anterior).  APGAR: 9, 10; weight pending.   Placenta status: Spontaneous, Intact.  Cord: 3 vessels with the following complications:  none.  Cord pH: n/a  Anesthesia: Epidural Episiotomy: None Lacerations: 2nd degree;Perineal Suture Repair: 3.0 vicryl Est. Blood Loss (mL):  500, QBL pending  Mom to postpartum.  Baby to Couplet care / Skin to Skin.  Called to see patient.  Mom pushed to deliver a viable female infant.  The head followed by shoulders, which delivered without difficulty, and the rest of the body.  No nuchal cord noted.  Baby to mom's chest.  Cord clamped and cut after > 1 min delay.  No cord blood obtained.  Placenta delivered spontaneously, intact, with a 3-vessel cord.  Second degree perineal laceration repaired with 3-0 Vicryl in standard fashion.  All counts correct.  Hemostasis obtained with IV pitocin and fundal massage. EBL 500 mL.     Prentice Docker, MD 10/21/2020, 3:57 PM

## 2020-10-11 ENCOUNTER — Observation Stay
Admission: EM | Admit: 2020-10-11 | Discharge: 2020-10-11 | Disposition: A | Discharge status: Home / Self Care | Payer: BC Managed Care – PPO | Attending: Obstetrics and Gynecology | Admitting: Obstetrics and Gynecology

## 2020-10-11 ENCOUNTER — Encounter: Payer: BC Managed Care – PPO | Admitting: Obstetrics and Gynecology

## 2020-10-11 ENCOUNTER — Other Ambulatory Visit: Payer: Self-pay

## 2020-10-11 DIAGNOSIS — Z3A38 38 weeks gestation of pregnancy: Secondary | ICD-10-CM | POA: Diagnosis not present

## 2020-10-11 DIAGNOSIS — R059 Cough, unspecified: Secondary | ICD-10-CM | POA: Diagnosis not present

## 2020-10-11 DIAGNOSIS — R0781 Pleurodynia: Secondary | ICD-10-CM | POA: Diagnosis not present

## 2020-10-11 DIAGNOSIS — O99891 Other specified diseases and conditions complicating pregnancy: Secondary | ICD-10-CM | POA: Diagnosis not present

## 2020-10-11 DIAGNOSIS — O26893 Other specified pregnancy related conditions, third trimester: Principal | ICD-10-CM | POA: Insufficient documentation

## 2020-10-11 DIAGNOSIS — Z3490 Encounter for supervision of normal pregnancy, unspecified, unspecified trimester: Secondary | ICD-10-CM

## 2020-10-11 DIAGNOSIS — R0789 Other chest pain: Secondary | ICD-10-CM | POA: Diagnosis not present

## 2020-10-11 DIAGNOSIS — Z3480 Encounter for supervision of other normal pregnancy, unspecified trimester: Secondary | ICD-10-CM

## 2020-10-11 DIAGNOSIS — R058 Other specified cough: Secondary | ICD-10-CM | POA: Diagnosis present

## 2020-10-11 MED ORDER — CYCLOBENZAPRINE HCL 10 MG PO TABS
5.0000 mg | ORAL_TABLET | Freq: Once | ORAL | Status: AC
  Administered 2020-10-11: 5 mg via ORAL
  Filled 2020-10-11 (×2): qty 0.5

## 2020-10-11 MED ORDER — LIDOCAINE 5 % EX PTCH: 1.0000 | MEDICATED_PATCH | CUTANEOUS | 0 refills | Status: DC

## 2020-10-11 MED ORDER — HYDROCODONE-ACETAMINOPHEN 5-325 MG PO TABS: 1.0000 | ORAL_TABLET | Freq: Four times a day (QID) | ORAL | 0 refills | Status: AC | PRN

## 2020-10-11 MED ORDER — LIDOCAINE 5 % EX PTCH
1.0000 | MEDICATED_PATCH | CUTANEOUS | Status: DC
  Administered 2020-10-11: 1 via TRANSDERMAL
  Filled 2020-10-11: qty 1

## 2020-10-11 MED ORDER — CYCLOBENZAPRINE HCL 5 MG PO TABS: 5.0000 mg | ORAL_TABLET | Freq: Once | ORAL | 0 refills | Status: AC

## 2020-10-11 NOTE — ED Triage Notes (Addendum)
Pt comes pov [redacted] weeks pregnant with contractions q 3-4 mins with rib pain. Last night pt bent over and left sided rib pain after a pop. Pt denies sob, just tightness from the pain. Pt has good air movement in both lung fields.

## 2020-10-11 NOTE — ED Notes (Signed)
Dr. Larinda Buttery at bedside and agrees to plan to go to L&D.

## 2020-10-11 NOTE — Progress Notes (Signed)
Pt got up to change for discharge and rib pain came back and pt crying with pain when pain had improved with interventions. Dr Jean Rosenthal at [facility], further assessment performed. Dr Jean Rosenthal reassured pt and suggests an ace wrap to support and limit movement. And further medications sent to pharmacy.

## 2020-10-11 NOTE — Final Progress Note (Addendum)
Final Progress Note  Patient ID: Chelsea Duncan MRN: [id number] DOB/AGE: May 22, 1986 35 y.o.  Admit date: 10/11/2020 Admitting provider: Orlie Pollen, CNM Discharge date: 10/11/2020   Admission Diagnoses: Pregnancy Rib pain on left side Dry cough  Discharge Diagnoses:  Principal Problem:   Supervision of other normal pregnancy, antepartum Active Problems:   Pregnancy   Rib pain on left side   Dry cough   [redacted] weeks gestation of pregnancy    History of Present Illness: The patient is a 35 y.o. female G3P2002 at 52w0dwho presents with L sided rib pain that started about 2 days ago. Patient reports that she has been experiencing sx of an URI for about 13 days which has including a persistent dry cough. Patient has been tested and was flu and COVID-19 negative. Patient states that she feels the rib pain was first associated with coughing. The pain is minimal without movement, but with movement or coughing patient rates the pain as 8/10. Patient also reports mild, intermittent contractions which exacerbated the rib pain. Patient states that today, while bending over, the pain became much more sharp, 10/10, and immediately limited her mobility. She decided to present to triage at that time. Patient denies further S&S at this time. She states that her contractions are mild and "don't feel like labor."  Past Medical History:  Diagnosis Date  . Allergy   . Anxiety   . 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   . BRCA negative 03/2016   MyRisk neg except NBN VUS  . Chicken pox   . Complication of anesthesia   . Depression during pregnancy 08/29/2020  . Family history of breast cancer 03/2016   IBIS=18.4%  She has been tested for at least BRCA and was negative (~2017). Tyrer-Cuzick (lifetime) was ~18%.   .Marland KitchenPONV (postoperative nausea and vomiting)   . Pyelonephritis   . S/P skin biopsy    neck normal   . UTI (urinary tract infection)     Past Surgical History:   Procedure Laterality Date  . BONE CYST EXCISION     right foot.    No current facility-administered medications on file prior to encounter.   Current Outpatient Medications on File Prior to Encounter  Medication Sig Dispense Refill  . Prenatal Vit-Fe Fumarate-FA (PRENATAL VITAMIN PO) Take by mouth.    . ALPRAZolam (XANAX) 0.25 MG tablet Take 0.5 tablets (0.125 mg total) by mouth daily as needed for anxiety. (Patient not taking: No sig reported) 30 tablet 2  . busPIRone (BUSPAR) 10 MG tablet 10 mg in the am and 5 mg at lunch (Patient not taking: Reported on 02/22/2020) 180 tablet 3  . EPINEPHrine (EPIPEN 2-PAK) 0.3 mg/0.3 mL IJ SOAJ injection Inject into the muscle once.    . sertraline (ZOLOFT) 50 MG tablet Take 1 tablet (50 mg total) by mouth daily. (Patient not taking: Reported on 10/11/2020) 30 tablet 1    Allergies  Allergen Reactions  . Cefadroxil Nausea And Vomiting  . Fire ADynegy    anaphylaxis  . Septra [Sulfamethoxazole-Trimethoprim] Nausea And Vomiting    Social History   Socioeconomic History  . Marital status: Single    Spouse name: Not on file  . Number of children: Not on file  . Years of education: Not on file  . Highest education level: Not on file  Occupational History  . Not on file  Tobacco Use  . Smoking status: Never Smoker  . Smokeless  tobacco: Never Used  Vaping Use  . Vaping Use: Never used  Substance and Sexual Activity  . Alcohol use: Yes    Comment: social  . Drug use: No  . Sexual activity: Yes    Partners: Male  Other Topics Concern  . Not on file  Social History Narrative   Asst Engineer, [employer]    Married 2 boys    Grew up in [place]    1 older sister 41 years older than her    Social Determinants of Health   Financial Resource Strain: Not on file  Food Insecurity: Not on file  Transportation Needs: Not on file  Physical Activity: Not on file  Stress: Not on file  Social Connections: Not on file  Intimate Partner  Violence: Not on file    Family History  Problem Relation Age of Onset  . Cancer Mother 48       breast / died at 10 in Jan 02, 2010  . Hypertension Mother   . Depression Mother   . Arthritis Father   . Depression Father        committed suicide in 02-Jan-2015  . Hypertension Father   . Hypertension Sister   . Heart disease Sister        heart murmur  . Birth defects Sister   . Depression Sister   . Arthritis Maternal Grandmother   . Other Maternal Grandmother        elevated tau protein; had corticobasal degneration   . Cancer Paternal Grandmother        lung  . Emphysema Paternal Grandfather      Review of Systems  Constitutional: Negative for chills, fever and malaise/fatigue.  HENT: Negative for congestion, sinus pain and sore throat.   Eyes: Negative.   Respiratory: Positive for cough. Negative for sputum production, shortness of breath and wheezing.   Cardiovascular: Positive for chest pain.       Chest pain at site of ribs.  Gastrointestinal: Positive for abdominal pain. Negative for nausea and vomiting.       Abd pain with mild contractions  Genitourinary: Negative.  Negative for flank pain.  Musculoskeletal: Negative for back pain, joint pain and myalgias.  Skin: Negative.   Neurological: Negative.   Endo/Heme/Allergies: Negative.   Psychiatric/Behavioral: Negative.      Physical Exam: BP 122/69   Pulse 79   Temp 98.8 F (37.1 C) (Oral)   Resp 18   Ht '5\' 3"'  (1.6 m)   Wt 63.5 kg   LMP 01/19/2020   SpO2 100%   BMI 24.80 kg/m   Physical Exam Constitutional:      General: She is not in acute distress.    Appearance: Normal appearance. She is not ill-appearing.  Genitourinary:     Genitourinary Comments: SVE: 1.5/50/ballotable  HENT:     Head: Normocephalic.  Cardiovascular:     Rate and Rhythm: Normal rate.  Pulmonary:     Effort: Pulmonary effort is normal.     Comments: Chest tenderness to palpation localized over ribs 6-9 with extreme tenderness in  intercostal space Chest:     Chest wall: Tenderness present.  Abdominal:     Tenderness: There is no abdominal tenderness.     Comments: Gravid, palpate mild contractions  Neurological:     General: No focal deficit present.     Mental Status: She is alert and oriented to person, place, and time.  Skin:    General: Skin is warm and dry.  Psychiatric:        Mood and Affect: Mood normal.        Behavior: Behavior normal.  Vitals and nursing note reviewed.    Consults: None  Significant Findings/ Diagnostic Studies: none  Procedures:  NONSTRESS TEST INTERPRETATION  INDICATIONS: rule out uterine contractions FHR baseline: 120 RESULTS:  A NST procedure was performed with FHR monitoring and a normal baseline established, appropriate time of 20-40 minutes of evaluation, and accels >2 seen w 15x15 characteristics.  Results show a REACTIVE NST.   Hospital Course: The patient was admitted to Labor and Delivery Triage for observation. On exam, reassuring for localized MSK rib pain secondary to persistent cough, possible costochondritis. Patient noted to have [place] ctx, no cervical change appreciated from previous SVE in office. Rib pain improved with lidoderm patch, flexeril, and heat. Plan to discharge home. Follow-up with previously schedule ROB visits or if symptoms persist or worsen.  Discharge Condition: good  Disposition: Discharge disposition: 01-Home or Self Care       Diet: Regular diet  Discharge Activity: Activity as tolerated   Allergies as of 10/11/2020      Reactions   Cefadroxil Nausea And Vomiting   Fire Ant    anaphylaxis   Septra [sulfamethoxazole-trimethoprim] Nausea And Vomiting      Medication List    STOP taking these medications   ALPRAZolam 0.25 MG tablet Commonly known as: Xanax   busPIRone 10 MG tablet Commonly known as: BUSPAR   sertraline 50 MG tablet Commonly known as: Zoloft     TAKE these medications   cyclobenzaprine 5 MG  tablet Commonly known as: FLEXERIL Take 1 tablet (5 mg total) by mouth once for 1 dose.   EpiPen 2-Pak 0.3 mg/0.3 mL Soaj injection Generic drug: EPINEPHrine Inject into the muscle once.   lidocaine 5 % Commonly known as: LIDODERM Place 1 patch onto the skin daily. Remove & Discard patch within 12 hours or as directed by MD Start taking on: October 12, 2020   PRENATAL VITAMIN PO Take by mouth.        Total time spent taking care of this patient: 20 minutes  Signed:  Orlie Pollen, CNM 10/11/2020, 6:31 PM

## 2020-10-11 NOTE — Discharge Instructions (Signed)
Call provider or return to birthplace with:  1. Strong regular contractions every 5 minutes. 2. Leaking of fluid from your vaginal 3. Vaginal bleeding: Bright red or heavy like a period 4. Decreased Fetal movement   Abdominal Pain During Pregnancy  Belly (abdominal) pain is common during pregnancy. There are many possible causes. Most of the time, it is not a serious problem. Other times, it can be a sign that something is wrong with the pregnancy. Always tell your doctor if you have belly pain. Follow these instructions at home:  Do not have sex or put anything in your vagina until your pain goes away completely.  Get plenty of rest until your pain gets better.  Drink enough fluid to keep your pee (urine) pale yellow.  Take over-the-counter and prescription medicines only as told by your doctor.  Keep all follow-up visits as told by your doctor. This is important. Contact a doctor if:  Your pain continues or gets worse after resting.  You have lower belly pain that: ? Comes and goes at regular times. ? Spreads to your back. ? Feels like menstrual cramps.  You have pain or burning when you pee (urinate). Get help right away if:  You have a fever or chills.  You have vaginal bleeding.  You are leaking fluid from your vagina.  You are passing tissue from your vagina.  You throw up (vomit) for more than 24 hours.  You have watery poop (diarrhea) for more than 24 hours.  Your baby is moving less than usual.  You feel very weak or faint.  You have shortness of breath.  You have very bad pain in your upper belly. Summary  Belly (abdominal) pain is common during pregnancy. There are many possible causes.  If you have belly pain during pregnancy, tell your doctor right away.  Keep all follow-up visits as told by your doctor. This is important. This information is not intended to replace advice given to you by your health care provider. Make sure you discuss any  questions you have with your health care provider. Document Revised: 01/11/2019 Document Reviewed: 12/26/2016 Elsevier Patient Education  2020 [employer].

## 2020-10-11 NOTE — Discharge Summary (Signed)
See final progress note.

## 2020-10-11 NOTE — OB Triage Note (Signed)
Pt presenst to L&D with complaints of left rib pain. Pt recovering from a cold as Flu and covid were both neg after being seen in the ED 12/27. Was started on antibiotics 1/4 but vomited so hasn't been able to tolerate it due to vomiting and was told it was in the same class as another antibiotic she is allergic to. Pt states she is contracting but  States contractions are mild but with each contraction left rib pain is exasperated. Pt denies LOF or vaginal bleeding and reports decreased fetal movement for the last 2 days. EFM and toco applied and explained. Left rib pain is painful to touch and intensifies with contractions.

## 2020-10-12 ENCOUNTER — Telehealth: Payer: Self-pay | Admitting: Obstetrics and Gynecology

## 2020-10-12 ENCOUNTER — Telehealth: Payer: Self-pay

## 2020-10-12 NOTE — Telephone Encounter (Signed)
Spoke w/Matthew. He missed JEG call d/t assisting patient in/out of bath tub. They are using heating pad, bath soaks w/epsom salt, taking cyclobenzaprine and did use pain meds last night. Patient is doing ok right now. Advised After hours/on call available should they need further assistance.He explained patient felt a pop in her back yesterday while bending over to assist child with seat belt in [place]. Aware this will take time to heal.

## 2020-10-12 NOTE — Telephone Encounter (Signed)
Husband Molli Hazard reports pt went to ED last night & was transferred to L&D

## 2020-10-12 NOTE — Telephone Encounter (Signed)
Pt husbands called stating wife was seen in ER lastnight with SDJ. Due to rib pain pt was given medication for pain. Husband states the medication isn't working wife is still experiencing pain and discomfort. Could you please call pt with a way to alleviate pain.

## 2020-10-12 NOTE — Telephone Encounter (Signed)
Attempted phone call- no answer, mailbox full. Patient can try soaking in tub with epsom salt. Not sure if she wants or needs a short course of narcotic pain relief.

## 2020-10-12 NOTE — Telephone Encounter (Signed)
Can you please advise pt since you are on call? I was not sure if SDJ still at hospital now

## 2020-10-12 NOTE — Telephone Encounter (Signed)
Husband Molli Hazard reports patient went to ED last night and was sent to L&D for monitoring. She's having severe rib pain radiating to her back. She was prescribed medication, but it doesn't seem to be touching it. She seems to be getting worse. Requesting [phone]

## 2020-10-16 ENCOUNTER — Encounter: Payer: Self-pay | Admitting: Obstetrics and Gynecology

## 2020-10-16 ENCOUNTER — Ambulatory Visit (INDEPENDENT_AMBULATORY_CARE_PROVIDER_SITE_OTHER): Payer: BC Managed Care – PPO | Admitting: Obstetrics and Gynecology

## 2020-10-16 ENCOUNTER — Other Ambulatory Visit: Payer: Self-pay

## 2020-10-16 VITALS — BP 120/70 | Wt 140.0 lb

## 2020-10-16 DIAGNOSIS — F32A Depression, unspecified: Secondary | ICD-10-CM

## 2020-10-16 DIAGNOSIS — Z3A38 38 weeks gestation of pregnancy: Secondary | ICD-10-CM

## 2020-10-16 DIAGNOSIS — O99343 Other mental disorders complicating pregnancy, third trimester: Secondary | ICD-10-CM

## 2020-10-16 DIAGNOSIS — Z3483 Encounter for supervision of other normal pregnancy, third trimester: Secondary | ICD-10-CM

## 2020-10-16 NOTE — Telephone Encounter (Signed)
Did not see this until now

## 2020-10-16 NOTE — Progress Notes (Signed)
Routine Prenatal Care Visit  Subjective  Chelsea Duncan is a 35 y.o. G3P2002 at [redacted]w[redacted]d being seen today for ongoing prenatal care.  She is currently monitored for the following issues for this low-risk pregnancy and has FH: breast cancer in first degree relative; Obsessive-compulsive disorder; Social anxiety disorder; GAD (generalized anxiety disorder); Annual physical exam; Supervision of other normal pregnancy, antepartum; Vaginal bleeding in pregnancy, first trimester; Depression during pregnancy; Pregnancy; Rib pain on left side; Dry cough; and [redacted] weeks gestation of pregnancy on their problem list.  ----------------------------------------------------------------------------------- Patient reports no complaints.   Contractions: Not present. Vag. Bleeding: None.  Movement: Present. Leaking Fluid denies.  ----------------------------------------------------------------------------------- The following portions of the patient's history were reviewed and updated as appropriate: allergies, current medications, past family history, past medical history, past social history, past surgical history and problem list. Problem list updated.  Objective  Blood pressure 120/70, weight 140 lb (63.5 kg), last menstrual period 01/19/2020. Pregravid weight 115 lb (52.2 kg) Total Weight Gain 25 lb (11.3 kg) Urinalysis: Urine Protein    Urine Glucose    Fetal Status: Fetal Heart Rate (bpm): 145 Fundal Height: 39 cm Movement: Present  Presentation: Vertex  General:  Alert, oriented and cooperative. Patient is in no acute distress.  Skin: Skin is warm and dry. No rash noted.   Cardiovascular: Normal heart rate noted  Respiratory: Normal respiratory effort, no problems with respiration noted  Abdomen: Soft, gravid, appropriate for gestational age. Pain/Pressure: Absent     Pelvic:  Cervical exam performed Dilation: 3 Effacement (%): 50 Station: -3  Extremities: Normal range of motion.  Edema: None  Mental  Status: Normal mood and affect. Normal behavior. Normal judgment and thought content.   BSUS: cephalic presentation, subjectively normal fluid.  Assessment   35 y.o. [id number] at [redacted]w[redacted]d by  10/25/2020, by Last Menstrual Period presenting for routine prenatal visit  Plan   Pregnancy#3 Problems (from 01/19/20 to present)    Problem Noted Resolved   Depression during pregnancy 08/29/2020 by Will Bonnet, MD No   Supervision of other normal pregnancy, antepartum 02/22/2020 by Will Bonnet, MD No   Overview Addendum 08/17/2020  9:39 AM by Will Bonnet, MD    Clinic [facility] Prenatal Labs  Dating L=6 Blood type: A/Positive/-- (05/18 1519)   Genetic Screen NIPS: diploid XY Antibody:Negative (05/18 1519)  Anatomic [place] complete Rubella: 2.44 (05/18 1519)  Varicella: Immune  GTT Third trimester: nml RPR: Non Reactive (05/18 1519)   Rhogam n/a HBsAg: Negative (05/18 1519)   TDaP vaccine 08/17/20          Flu Shot: HIV: Non Reactive (05/18 1519)   Baby Food                                GBS:   Contraception  Pap:06/21/2019  CBB     CS/VBAC    Support Person Renne Musca           Previous Version   Vaginal bleeding in pregnancy, first trimester 02/22/2020 by Will Bonnet, MD No       Term labor symptoms and general obstetric precautions including but not limited to vaginal bleeding, contractions, leaking of fluid and fetal movement were reviewed in detail with the patient. Please refer to After Visit Summary for other counseling recommendations.   - Orders placed for IOL, will perform H&P day of admission  Return in 5 days (on 10/21/2020) for Keep IOL  appt @ 0800.   Prentice Docker, MD, [facility], [facility] 10/16/2020 4:47 PM

## 2020-10-16 NOTE — Patient Instructions (Signed)
Induction Information: Your induction date: 10/21/20 at 0800 AM Covid test: 10/19/20 between 9-10AM. Go to the [facility] drive-through.  Wear a mask and stay in your car.   This should not take long.  On your induction date, go to the ER a little before your induction time and let them know you're there for your labor induction.

## 2020-10-19 ENCOUNTER — Other Ambulatory Visit: Payer: Self-pay

## 2020-10-19 ENCOUNTER — Other Ambulatory Visit
Admission: RE | Admit: 2020-10-19 | Discharge: 2020-10-19 | Disposition: A | Discharge status: Home / Self Care | Payer: BC Managed Care – PPO | Source: Ambulatory Visit | Attending: Obstetrics and Gynecology | Admitting: Obstetrics and Gynecology

## 2020-10-19 DIAGNOSIS — Z20822 Contact with and (suspected) exposure to covid-19: Secondary | ICD-10-CM | POA: Insufficient documentation

## 2020-10-19 DIAGNOSIS — Z01812 Encounter for preprocedural laboratory examination: Secondary | ICD-10-CM | POA: Insufficient documentation

## 2020-10-19 LAB — SARS CORONAVIRUS 2 (TAT 6-24 HRS): SARS Coronavirus 2: NEGATIVE

## 2020-10-21 ENCOUNTER — Inpatient Hospital Stay: Payer: BC Managed Care – PPO | Admitting: Anesthesiology

## 2020-10-21 ENCOUNTER — Other Ambulatory Visit: Payer: Self-pay

## 2020-10-21 ENCOUNTER — Inpatient Hospital Stay
Admission: EM | Admit: 2020-10-21 | Discharge: 2020-10-22 | DRG: 807 | Disposition: A | Discharge status: Home / Self Care | Payer: BC Managed Care – PPO | Attending: Obstetrics and Gynecology | Admitting: Obstetrics and Gynecology

## 2020-10-21 ENCOUNTER — Encounter: Payer: Self-pay | Admitting: Obstetrics and Gynecology

## 2020-10-21 DIAGNOSIS — O26893 Other specified pregnancy related conditions, third trimester: Secondary | ICD-10-CM | POA: Diagnosis present

## 2020-10-21 DIAGNOSIS — Z3490 Encounter for supervision of normal pregnancy, unspecified, unspecified trimester: Secondary | ICD-10-CM | POA: Diagnosis present

## 2020-10-21 DIAGNOSIS — Z3A39 39 weeks gestation of pregnancy: Secondary | ICD-10-CM | POA: Diagnosis not present

## 2020-10-21 DIAGNOSIS — Z20822 Contact with and (suspected) exposure to covid-19: Secondary | ICD-10-CM | POA: Diagnosis present

## 2020-10-21 DIAGNOSIS — Z3480 Encounter for supervision of other normal pregnancy, unspecified trimester: Secondary | ICD-10-CM

## 2020-10-21 LAB — CBC
HCT: 37.7 % (ref 36.0–46.0)
Hemoglobin: 12.2 g/dL (ref 12.0–15.0)
MCH: 30.3 pg (ref 26.0–34.0)
MCHC: 32.4 g/dL (ref 30.0–36.0)
MCV: 93.5 fL (ref 80.0–100.0)
Platelets: 207 10*3/uL (ref 150–400)
RBC: 4.03 MIL/uL (ref 3.87–5.11)
RDW: 14.4 % (ref 11.5–15.5)
WBC: 7.9 10*3/uL (ref 4.0–10.5)
nRBC: 0 % (ref 0.0–0.2)

## 2020-10-21 LAB — RPR: RPR Ser Ql: NONREACTIVE

## 2020-10-21 LAB — TYPE AND SCREEN
ABO/RH(D): A POS
Antibody Screen: NEGATIVE

## 2020-10-21 MED ORDER — FENTANYL 2.5 MCG/ML W/ROPIVACAINE 0.15% IN NS 100 ML EPIDURAL (ARMC)
EPIDURAL | Status: AC
  Filled 2020-10-21: qty 100

## 2020-10-21 MED ORDER — PHENYLEPHRINE 40 MCG/ML (10ML) SYRINGE FOR IV PUSH (FOR BLOOD PRESSURE SUPPORT)
80.0000 ug | PREFILLED_SYRINGE | INTRAVENOUS | Status: DC | PRN
  Filled 2020-10-21 (×2): qty 10

## 2020-10-21 MED ORDER — LIDOCAINE 5 % EX PTCH
1.0000 | MEDICATED_PATCH | Freq: Every day | CUTANEOUS | Status: DC | PRN
Start: 2020-10-21 — End: 2020-10-22

## 2020-10-21 MED ORDER — LIDOCAINE HCL (PF) 1 % IJ SOLN
INTRAMUSCULAR | Status: AC
  Filled 2020-10-21: qty 30

## 2020-10-21 MED ORDER — HYDROCODONE-ACETAMINOPHEN 5-325 MG PO TABS: 1.0000 | ORAL_TABLET | Freq: Four times a day (QID) | ORAL | Status: DC | PRN

## 2020-10-21 MED ORDER — FENTANYL 2.5 MCG/ML W/ROPIVACAINE 0.15% IN NS 100 ML EPIDURAL (ARMC)
EPIDURAL | Status: DC | PRN
  Administered 2020-10-21: 12 mL/h via EPIDURAL

## 2020-10-21 MED ORDER — EPHEDRINE 5 MG/ML INJ
10.0000 mg | INTRAVENOUS | Status: AC | PRN
  Administered 2020-10-21: 5 mg via INTRAVENOUS
  Administered 2020-10-21: 10 mg via INTRAVENOUS

## 2020-10-21 MED ORDER — MISOPROSTOL 200 MCG PO TABS
ORAL_TABLET | ORAL | Status: AC
  Filled 2020-10-21: qty 4

## 2020-10-21 MED ORDER — ONDANSETRON HCL 4 MG/2ML IJ SOLN: 4.0000 mg | INTRAMUSCULAR | Status: DC | PRN

## 2020-10-21 MED ORDER — ONDANSETRON HCL 4 MG PO TABS: 4.0000 mg | ORAL_TABLET | ORAL | Status: DC | PRN

## 2020-10-21 MED ORDER — SIMETHICONE 80 MG PO CHEW: 80.0000 mg | CHEWABLE_TABLET | ORAL | Status: DC | PRN

## 2020-10-21 MED ORDER — AMMONIA AROMATIC IN INHA
RESPIRATORY_TRACT | Status: AC
  Filled 2020-10-21: qty 10

## 2020-10-21 MED ORDER — LIDOCAINE HCL (PF) 1 % IJ SOLN
INTRAMUSCULAR | Status: DC | PRN
  Administered 2020-10-21: 1.5 mL via SUBCUTANEOUS

## 2020-10-21 MED ORDER — SOD CITRATE-CITRIC ACID 500-334 MG/5ML PO SOLN: 30.0000 mL | ORAL | Status: DC | PRN

## 2020-10-21 MED ORDER — IBUPROFEN 600 MG PO TABS
600.0000 mg | ORAL_TABLET | Freq: Four times a day (QID) | ORAL | Status: DC
  Administered 2020-10-21 – 2020-10-22 (×4): 600 mg via ORAL
  Filled 2020-10-21 (×4): qty 1

## 2020-10-21 MED ORDER — EPHEDRINE 5 MG/ML INJ
INTRAVENOUS | Status: AC
  Administered 2020-10-21: 5 mg via INTRAVENOUS
  Filled 2020-10-21: qty 4

## 2020-10-21 MED ORDER — DIPHENHYDRAMINE HCL 25 MG PO CAPS: 25.0000 mg | ORAL_CAPSULE | Freq: Four times a day (QID) | ORAL | Status: DC | PRN

## 2020-10-21 MED ORDER — SENNOSIDES-DOCUSATE SODIUM 8.6-50 MG PO TABS
2.0000 | ORAL_TABLET | ORAL | Status: DC
  Administered 2020-10-22: 2 via ORAL
  Filled 2020-10-21: qty 2

## 2020-10-21 MED ORDER — PRENATAL MULTIVITAMIN CH
1.0000 | ORAL_TABLET | Freq: Every day | ORAL | Status: DC
  Filled 2020-10-21: qty 1

## 2020-10-21 MED ORDER — DIPHENHYDRAMINE HCL 50 MG/ML IJ SOLN: 12.5000 mg | INTRAMUSCULAR | Status: DC | PRN

## 2020-10-21 MED ORDER — OXYTOCIN 10 UNIT/ML IJ SOLN
INTRAMUSCULAR | Status: AC
  Filled 2020-10-21: qty 2

## 2020-10-21 MED ORDER — COCONUT OIL OIL: 1.0000 "application " | TOPICAL_OIL | Status: DC | PRN

## 2020-10-21 MED ORDER — OXYTOCIN-SODIUM CHLORIDE 30-0.9 UT/500ML-% IV SOLN
1.0000 m[IU]/min | INTRAVENOUS | Status: DC
  Administered 2020-10-21: 1 m[IU]/min via INTRAVENOUS

## 2020-10-21 MED ORDER — TERBUTALINE SULFATE 1 MG/ML IJ SOLN: 0.2500 mg | Freq: Once | INTRAMUSCULAR | Status: DC | PRN

## 2020-10-21 MED ORDER — FAMOTIDINE IN NACL 20-0.9 MG/50ML-% IV SOLN
20.0000 mg | Freq: Two times a day (BID) | INTRAVENOUS | Status: DC | PRN
  Administered 2020-10-21: 20 mg via INTRAVENOUS
  Filled 2020-10-21: qty 50

## 2020-10-21 MED ORDER — DIBUCAINE (PERIANAL) 1 % EX OINT: 1.0000 "application " | TOPICAL_OINTMENT | CUTANEOUS | Status: DC | PRN

## 2020-10-21 MED ORDER — EPHEDRINE 5 MG/ML INJ
10.0000 mg | INTRAVENOUS | Status: DC | PRN
  Filled 2020-10-21: qty 4

## 2020-10-21 MED ORDER — FERROUS SULFATE 325 (65 FE) MG PO TABS
325.0000 mg | ORAL_TABLET | Freq: Two times a day (BID) | ORAL | Status: DC
  Administered 2020-10-22: 325 mg via ORAL
  Filled 2020-10-21: qty 1

## 2020-10-21 MED ORDER — WITCH HAZEL-GLYCERIN EX PADS: 1.0000 "application " | MEDICATED_PAD | CUTANEOUS | Status: DC | PRN

## 2020-10-21 MED ORDER — BENZOCAINE-MENTHOL 20-0.5 % EX AERO
1.0000 "application " | INHALATION_SPRAY | CUTANEOUS | Status: DC | PRN
  Administered 2020-10-21: 1 via TOPICAL
  Filled 2020-10-21: qty 56

## 2020-10-21 MED ORDER — LACTATED RINGERS IV SOLN: INTRAVENOUS | Status: DC

## 2020-10-21 MED ORDER — OXYTOCIN BOLUS FROM INFUSION
333.0000 mL | Freq: Once | INTRAVENOUS | Status: AC
  Administered 2020-10-21: 333 mL via INTRAVENOUS

## 2020-10-21 MED ORDER — LACTATED RINGERS IV SOLN: 500.0000 mL | Freq: Once | INTRAVENOUS | Status: DC

## 2020-10-21 MED ORDER — ACETAMINOPHEN 325 MG PO TABS
650.0000 mg | ORAL_TABLET | ORAL | Status: DC | PRN
  Administered 2020-10-21 – 2020-10-22 (×3): 650 mg via ORAL
  Filled 2020-10-21 (×3): qty 2

## 2020-10-21 MED ORDER — OXYTOCIN-SODIUM CHLORIDE 30-0.9 UT/500ML-% IV SOLN
2.5000 [IU]/h | INTRAVENOUS | Status: DC
  Filled 2020-10-21: qty 500

## 2020-10-21 MED ORDER — LIDOCAINE HCL (PF) 1 % IJ SOLN: 30.0000 mL | INTRAMUSCULAR | Status: DC | PRN

## 2020-10-21 MED ORDER — LIDOCAINE-EPINEPHRINE (PF) 1.5 %-1:200000 IJ SOLN
INTRAMUSCULAR | Status: DC | PRN
  Administered 2020-10-21: 3 mL via PERINEURAL

## 2020-10-21 MED ORDER — FENTANYL 2.5 MCG/ML W/ROPIVACAINE 0.15% IN NS 100 ML EPIDURAL (ARMC): 12.0000 mL/h | EPIDURAL | Status: DC

## 2020-10-21 MED ORDER — PHENYLEPHRINE 40 MCG/ML (10ML) SYRINGE FOR IV PUSH (FOR BLOOD PRESSURE SUPPORT)
80.0000 ug | PREFILLED_SYRINGE | INTRAVENOUS | Status: DC | PRN
  Administered 2020-10-21: 80 ug via INTRAVENOUS
  Filled 2020-10-21: qty 10

## 2020-10-21 MED ORDER — EPHEDRINE 5 MG/ML INJ: 5.0000 mg | Freq: Once | INTRAVENOUS | Status: AC

## 2020-10-21 MED ORDER — ONDANSETRON HCL 4 MG/2ML IJ SOLN: 4.0000 mg | Freq: Four times a day (QID) | INTRAMUSCULAR | Status: DC | PRN

## 2020-10-21 MED ORDER — OXYTOCIN 10 UNIT/ML IJ SOLN: 10.0000 [IU] | Freq: Once | INTRAMUSCULAR | Status: DC

## 2020-10-21 MED ORDER — LACTATED RINGERS IV SOLN: 500.0000 mL | INTRAVENOUS | Status: DC | PRN

## 2020-10-21 NOTE — Discharge Summary (Signed)
Postpartum Discharge Summary    Patient Name: Chelsea Duncan DOB: [birth year] MRN: [id number]  Date of admission: 10/21/2020 Delivery date:10/21/2020  Delivering provider: Prentice Docker D  Date of discharge: 10/22/2020  Admitting diagnosis: Encounter for elective induction of labor [Z34.90] Intrauterine pregnancy: [redacted]w[redacted]d    Secondary diagnosis:  Principal Problem:   Supervision of other normal pregnancy, antepartum Active Problems:   Encounter for elective induction of labor   [redacted] weeks gestation of pregnancy  Additional problems: none    Discharge diagnosis: Term Pregnancy Delivered                                              Post partum procedures:none Augmentation: AROM and Pitocin Complications: None  Hospital course: Onset of Labor With Vaginal Delivery      35y.o. yo G3P3003 at 320w3das admitted in Active Labor on 10/21/2020. Patient had an uncomplicated labor course as follows:  Membrane Rupture Time/Date: 11:19 AM ,10/21/2020   Delivery Method:Vaginal, Spontaneous  Episiotomy: None  Lacerations:  2nd degree;Perineal  Patient had an uncomplicated postpartum course.  She is ambulating, tolerating a regular diet, passing flatus, and urinating well. She had stopped taking zoloft at the end of her pregnancy. States she is doing fairly well and will restart the medication.  Had one elevated BP day of discharge.  Denies HA, Visual changes, and RUQ pain.  All other BPs normal.  Continue to monitor.  Patient is discharged home in stable condition on 10/22/20.  Newborn Data: Birth date:10/21/2020  Birth time:3:20 PM  Gender:Female  Living status:Living  Apgars:9 ,10  Weight:4020 g   Magnesium Sulfate received: No BMZ received: No Rhophylac:No MMR:No T-DaP:Given prenatally Flu: No Transfusion:No  Physical exam  Vitals:   10/21/20 1836 10/21/20 2008 10/21/20 2350 10/22/20 0309  BP: 118/74 126/70 139/65 (!) 124/93  Pulse: 72 71 82 75  Resp: '18 20 18 18  ' Temp: 98.2 F  (36.8 C)  97.9 F (36.6 C) 97.7 F (36.5 C)  TempSrc: Oral  Oral Oral  SpO2: 100% 100% 99% 99%  Weight:      Height:       General: alert, cooperative and no distress Lochia: appropriate Uterine Fundus: firm Incision: N/A DVT Evaluation: No evidence of DVT seen on physical exam. No cords or calf tenderness. No significant calf/ankle edema. Labs: Lab Results  Component Value Date   WBC 9.9 10/22/2020   HGB 10.3 (L) 10/22/2020   HCT 31.4 (L) 10/22/2020   MCV 93.7 10/22/2020   PLT 182 10/22/2020   CMP Latest Ref Rng & Units 01/21/2020  Glucose 70 - 99 mg/dL 98  BUN 6 - 20 mg/dL 13  Creatinine 0.44 - 1.00 mg/dL 1.07(H)  Sodium 135 - 145 mmol/L 138  Potassium 3.5 - 5.1 mmol/L 3.9  Chloride 98 - 111 mmol/L 105  CO2 22 - 32 mmol/L 25  Calcium 8.9 - 10.3 mg/dL 9.3  Total Protein 6.1 - 8.1 g/dL -  Total Bilirubin 0.2 - 1.2 mg/dL -  Alkaline Phos 39 - 117 U/L -  AST 10 - 30 U/L -  ALT 6 - 29 U/L -   Edinburgh Score: Edinburgh Postnatal Depression Scale Screening Tool 10/22/2020  I have been able to laugh and see the funny side of things. 1  I have looked forward with enjoyment to things. 1  I have blamed  myself unnecessarily when things went wrong. 2  I have been anxious or worried for no good reason. 3  I have felt scared or panicky for no good reason. 2  Things have been getting on top of me. 2  I have been so unhappy that I have had difficulty sleeping. 2  I have felt sad or miserable. 2  I have been so unhappy that I have been crying. 1  The thought of harming myself has occurred to me. 0  Edinburgh Postnatal Depression Scale Total 16      After visit meds:  Allergies as of 10/22/2020      Reactions   Cefadroxil Nausea And Vomiting   Fire Ant    anaphylaxis   Septra [sulfamethoxazole-trimethoprim] Nausea And Vomiting      Medication List    TAKE these medications   EpiPen 2-Pak 0.3 mg/0.3 mL Soaj injection Generic drug: EPINEPHrine Inject into the muscle  once.   ferrous sulfate 325 (65 FE) MG tablet Take 1 tablet (325 mg total) by mouth daily with breakfast.   ibuprofen 600 MG tablet Commonly known as: ADVIL Take 1 tablet (600 mg total) by mouth every 6 (six) hours.   lidocaine 5 % Commonly known as: LIDODERM Place 1 patch onto the skin daily. Remove & Discard patch within 12 hours or as directed by MD   PRENATAL VITAMIN PO Take by mouth.            Discharge Care Instructions  (From admission, onward)         Start     Ordered   10/22/20 0000  Discharge wound care:       Comments: Perform wound care instructions   10/22/20 0831           Discharge home in stable condition Infant Feeding: Bottle and Breast Infant Disposition:home with mother Discharge instruction: per After Visit Summary and Postpartum booklet. Activity: Advance as tolerated. Pelvic rest for 6 weeks.  Diet: routine diet Anticipated Birth Control: IUD Postpartum Appointment:6 weeks Additional Postpartum F/U: Postpartum Depression checkup Future Appointments:No future appointments. Follow up Visit:  Follow-up Information    Will Bonnet, MD. Schedule an appointment as soon as possible for a visit in 2 week(s).   Specialty: Obstetrics and Gynecology Why: postpartum depression check Contact information: [address] [place] [address] [phone]              SIGNED:  Prentice Docker, MD, [facility], [facility] 10/22/2020 8:32 AM

## 2020-10-21 NOTE — H&P (Signed)
OB History & Physical   History of Present Illness:  Chief Complaint: water broke, contractions  HPI:  Chelsea Duncan is a 35 y.o. G47P2002 female at 7w3ddated by LMP c/w 6 week u/s.  Her pregnancy has been complicated by OCD, social anxiety disorder, generalized anxiety disorder, vaginal bleeding in first trimester, depression, rib pain on left side.    She reports contractions.   She reports leakage of fluid early this morning.   She reports bloody show vaginal bleeding.   She reports fetal movement.    She stopped taking zoloft approximately 3 weeks ago and thinks she may need to restart postpartum. She has been coping generally well with some more recent ups and downs.  Total weight gain for pregnancy: 11.3 kg   Obstetrical Problem List: Pregnancy#3 Problems (from 01/19/20 to present)    Problem Noted Resolved   Depression during pregnancy 08/29/2020 by JWill Bonnet MD No   Supervision of other normal pregnancy, antepartum 02/22/2020 by JWill Bonnet MD No   Overview Addendum 08/17/2020  9:39 AM by JWill Bonnet MD    Clinic [facility] Prenatal Labs  Dating L=6 Blood type: A/Positive/-- (05/18 1519)   Genetic Screen NIPS: diploid XY Antibody:Negative (05/18 1519)  Anatomic [place] Rubella: 2.44 (05/18 1519)  Varicella: Immune  GTT Third trimester: nml RPR: Non Reactive (05/18 1519)   Rhogam n/a HBsAg: Negative (05/18 1519)   TDaP vaccine 08/17/20          Flu Shot: HIV: Non Reactive (05/18 1519)   Baby Food                                GBS:   Contraception  Pap:06/21/2019  CBB     CS/VBAC    Support Person HRenne Musca          Previous Version   Vaginal bleeding in pregnancy, first trimester 02/22/2020 by JWill Bonnet MD No       Maternal Medical History:   Past Medical History:  Diagnosis Date  . Allergy   . Anxiety   . 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   . BRCA negative 03/2016   MyRisk neg except NBN  VUS  . Chicken pox   . Complication of anesthesia   . Depression during pregnancy 08/29/2020  . Family history of breast cancer 03/2016   IBIS=18.4%  She has been tested for at least BRCA and was negative (~2017). Tyrer-Cuzick (lifetime) was ~18%.   .Marland KitchenPONV (postoperative nausea and vomiting)   . Pyelonephritis   . S/P skin biopsy    neck normal   . UTI (urinary tract infection)     Past Surgical History:  Procedure Laterality Date  . BONE CYST EXCISION     right foot.    Allergies  Allergen Reactions  . Cefadroxil Nausea And Vomiting  . Fire ADynegy    anaphylaxis  . Septra [Sulfamethoxazole-Trimethoprim] Nausea And Vomiting    Prior to Admission medications   Medication Sig Start Date End Date Taking? Authorizing Provider  EPINEPHrine (EPIPEN 2-PAK) 0.3 mg/0.3 mL IJ SOAJ injection Inject into the muscle once.    [provider]  lidocaine (LIDODERM) 5 % Place 1 patch onto the skin daily. Remove & Discard patch within 12 hours or as directed by MD 10/12/20   JWill Bonnet MD  Prenatal Vit-Fe Fumarate-FA (  PRENATAL VITAMIN PO) Take by mouth.    [provider]    OB History  Gravida Para Term Preterm AB Living  '3 2 2 ' 0 0 2  SAB IAB Ectopic Multiple Live Births  0 0 0 0 2    # Outcome Date GA Lbr Len/2nd Weight Sex Delivery Anes PTL Lv  3 Current           2 Term 01/18/16 [redacted]w[redacted]d/ 00:21 3240 g M Vag-Spont EPI  LIV  1 Term 06/03/14 329w6d M Vag-Spont   LIV    Prenatal care site: [facility]  Social History: She  reports that she has never smoked. She has never used smokeless tobacco. She reports current alcohol use. She reports that she does not use drugs.  Family History: family history includes Arthritis in her father and maternal grandmother; Birth defects in her sister; Cancer in her paternal grandmother; Cancer (age of onset: 3648in her mother; Depression in her father, mother, and sister; Emphysema in her paternal grandfather; Heart disease  in her sister; Hypertension in her father, mother, and sister; Other in her maternal grandmother.    Review of Systems:  Review of Systems  Constitutional: Negative for chills and fever.  HENT: Negative for congestion, ear discharge, ear pain, hearing loss, sinus pain and sore throat.   Eyes: Negative for blurred vision and double vision.  Respiratory: Negative for cough, shortness of breath and wheezing.   Cardiovascular: Negative for chest pain, palpitations and leg swelling.  Gastrointestinal: Negative for abdominal pain, blood in stool, constipation, diarrhea, heartburn, melena, nausea and vomiting.  Genitourinary: Negative for dysuria, flank pain, frequency, hematuria and urgency.  Musculoskeletal: Negative for back pain, joint pain and myalgias.  Skin: Negative for itching and rash.  Neurological: Negative for dizziness, tingling, tremors, sensory change, speech change, focal weakness, seizures, loss of consciousness, weakness and headaches.  Endo/Heme/Allergies: Negative for environmental allergies. Does not bruise/bleed easily.  Psychiatric/Behavioral: Negative for depression, hallucinations, memory loss, substance abuse and suicidal ideas. The patient is not nervous/anxious and does not have insomnia.      Physical Exam:  BP 130/76   Pulse 71   Temp 98.4 F (36.9 C) (Oral)   Resp 18   Ht '5\' 3"'  (1.6 m)   Wt 63.5 kg   LMP 01/19/2020   SpO2 99%   BMI 24.80 kg/m   Constitutional: Well nourished, well developed female in no acute distress.  HEENT: normal Skin: Warm and dry.  Cardiovascular: Regular rate and rhythm.   Extremity: no edema  Respiratory: Clear to auscultation bilateral. Normal respiratory effort Abdomen: FHT present Back: no CVAT Neuro: DTRs 2+, Cranial nerves grossly intact Psych: Alert and Oriented x3. No memory deficits. Normal mood and affect.  MS: normal gait, normal bilateral lower extremity ROM/strength/stability.  Pelvic exam: Per RN R.  Chelsea Duncan/50/-2  Toco: Q 2-4 minutes Fetal Well Being: 135 bpm, moderate variability, +accelerations, -decelerations   Lab Results  Component Value Date   SADuboisEGATIVE 10/19/2020  ]  Assessment:  AsCandas Duncan a [age].o. G3G30P2002emale at 3936w3dth SROM, active labor.   Plan:  1. Admit to Labor & Delivery  2. CBC, T&S, Clrs, IVF 3. GBS negative.   4. Fetal well-being: Category I 5. Expectant management, consider pitocin as needed for augmentation    JanRod CanNMMemorial Herm[facility]15/2022 6:42 AM

## 2020-10-21 NOTE — Anesthesia Preprocedure Evaluation (Signed)
Anesthesia Evaluation  Patient identified by MRN, date of birth, ID band Patient awake    Reviewed: Allergy & Precautions, NPO status , Patient's Chart, lab work & pertinent test results  History of Anesthesia Complications (+) PONV and history of anesthetic complications  Airway Mallampati: II       Dental   Pulmonary neg sleep apnea, neg COPD, Not current smoker,           Cardiovascular (-) hypertension(-) Past MI and (-) CHF (-) dysrhythmias (-) Cardiac Defibrillator      Neuro/Psych Anxiety Depression    GI/Hepatic Neg liver ROS, GERD (with pregnancy)  ,  Endo/Other  neg diabetes  Renal/GU negative Renal ROS     Musculoskeletal   Abdominal   Peds  Hematology   Anesthesia Other Findings   Reproductive/Obstetrics                             Anesthesia Physical Anesthesia Plan  ASA: II  Anesthesia Plan: Epidural   Post-op Pain Management:    Induction:   PONV Risk Score and Plan:   Airway Management Planned:   Additional Equipment:   Intra-op Plan:   Post-operative Plan:   Informed Consent: I have reviewed the patients History and Physical, chart, labs and discussed the procedure including the risks, benefits and alternatives for the proposed anesthesia with the patient or authorized representative who has indicated his/her understanding and acceptance.       Plan Discussed with:   Anesthesia Plan Comments:         Anesthesia Quick Evaluation

## 2020-10-21 NOTE — Plan of Care (Signed)
Alert and oriented with aprop. Affect. Color good, skin w&d. Assessment and VS WNL. Oriented to Room, Safety and Fall Prevention as well as POC and v/o.

## 2020-10-21 NOTE — Anesthesia Procedure Notes (Signed)
Epidural Patient location during procedure: OB Start time: 10/21/2020 10:33 AM End time: 10/21/2020 10:00 AM  Staffing Performed: anesthesiologist   Preanesthetic Checklist Completed: patient identified, IV checked, site marked, risks and benefits discussed, surgical consent, monitors and equipment checked, pre-op evaluation and timeout performed  Epidural Patient position: sitting Prep: Betadine Patient monitoring: heart rate, continuous pulse ox and blood pressure Approach: midline Location: L4-L5 Injection technique: LOR saline  Needle:  Needle type: Tuohy  Needle gauge: 17 G Needle length: 9 cm and 9 Needle insertion depth: 7 cm Catheter type: closed end flexible Catheter size: 20 Guage Catheter at skin depth: 12 cm Test dose: negative and 1.5% lidocaine with Epi 1:200 K  Assessment Sensory level: T10 Events: blood not aspirated, injection not painful, no injection resistance, no paresthesia and negative IV test  Additional Notes   Patient tolerated the insertion well without complications.-SATD -IVTD. No paresthesia. Refer to [facility] for VS and dosingReason for block:procedure for pain

## 2020-10-21 NOTE — Progress Notes (Signed)
Labor Check  Subj:  Complaints: none. Comfortable with epidural.    Obj:  BP (!) 81/42   Pulse (!) 115   Temp 97.9 F (36.6 C) (Axillary)   Resp 16   Ht 5\' 3"  (1.6 m)   Wt 63.5 kg   LMP 01/19/2020   SpO2 97%   BMI 24.80 kg/m     Cervix: Dilation: 7 / Effacement (%): 70 / Station: -2   AROM - abundant clear fluid Baseline FHR: 135 beats/min   Variability: moderate   Accelerations: present   Decelerations: absent Contractions: present frequency: 1-2 q 10 min Overall assessment: cat 1  Note: upon AROM at 1119 intermittent tracing of the fetal heart rate was obtained. Notably, a fetal hand presented with AROM, though the head was well-applied.  FHR tracing would intermittently show FHR in the 90s, then the 120s-130s.  At one point the FHR was in the 50s for a short period of time (around 1125). With maternal position changes, the fetal heart rate was easier to trace. For about 5 minutes the fetal heart rate averaged about 100 bpm, going from the 90s to the 100s and 110s ([id number]).  The FHR tracing during this time showed predominantly moderate variability.  No definitive, consistent FHR noted to be improving with lateral position changes during this time. So, patient able to be placed in knee-chest/hands-knees position (1132). The FHR quickly improved to the 120's, then dipped back into the 100s/110s for 3-4 minutes. There was continued moderate variability with a couple accelerations noted at this time..  At this point the fetal heart rate stabilized at around 120 bpm with moderate variability.  The baseline then increased back to 130s with moderate variability.  The patient was able to be placed on her back with continued category 1 FHR tracing. A cervical exam performed during hands-knees revealed no significant cervical change.    Female chaperone present for pelvic exam:   A/P: 35 y.o. G19P2002 female at [redacted]w[redacted]d with active labor.  1.  Labor: AROM. FHR now stabilized after initial period  of decelerations. Will augment with pitocin if no change within an hour.  Bedside u/s performed confirming cephalic presentation.    2.  FWB: reassuring at this time (see above note), Overall assessment: category 1  3.  GBS negative  4.  Pain: epidural 5.  Recheck: 1 hour.   Prentice Docker, MD, [facility], [facility] 10/21/2020 12:10 PM

## 2020-10-21 NOTE — Progress Notes (Signed)
  Labor Progress Note   35 y.o. G0F7494 @ [redacted]w[redacted]d , admitted for  Pregnancy, Labor Management.   Subjective:  Contractions feeling stronger  Objective:  BP 124/69 (BP Location: Left Arm)   Pulse 74   Temp 97.9 F (36.6 C) (Axillary)   Resp 18   Ht 5\' 3"  (1.6 m)   Wt 63.5 kg   LMP 01/19/2020   SpO2 99%   BMI 24.80 kg/m  Abd: gravid, ND, FHT present, mild tenderness on exam Extr: no edema SVE: CERVIX: 6 cm dilated, 70 effaced, -1 station, stretchy, bbow  EFM: FHR: 135 bpm, variability: moderate,  accelerations:  Present,  decelerations:  Absent Toco: Frequency: Every 6 minutes, moderate/strong to palpation Labs: I have reviewed the patient's lab results.   Assessment & Plan:  [id number] @ [redacted]w[redacted]d, admitted for  Pregnancy and Labor/Delivery Management  1. Pain management: position changes. 2. FWB: FHT category I.  3. ID: GBS negative 4. Labor management: expectant, consider AROM/Pitocin   All discussed with patient, see orders   Rod Can, [facility] 10/21/2020  7:36 AM

## 2020-10-22 LAB — CBC
HCT: 31.4 % — ABNORMAL LOW (ref 36.0–46.0)
Hemoglobin: 10.3 g/dL — ABNORMAL LOW (ref 12.0–15.0)
MCH: 30.7 pg (ref 26.0–34.0)
MCHC: 32.8 g/dL (ref 30.0–36.0)
MCV: 93.7 fL (ref 80.0–100.0)
Platelets: 182 10*3/uL (ref 150–400)
RBC: 3.35 MIL/uL — ABNORMAL LOW (ref 3.87–5.11)
RDW: 14.6 % (ref 11.5–15.5)
WBC: 9.9 10*3/uL (ref 4.0–10.5)
nRBC: 0 % (ref 0.0–0.2)

## 2020-10-22 MED ORDER — FERROUS SULFATE 325 (65 FE) MG PO TABS: 325.0000 mg | ORAL_TABLET | Freq: Every day | ORAL | 3 refills | Status: DC

## 2020-10-22 MED ORDER — IBUPROFEN 600 MG PO TABS: 600.0000 mg | ORAL_TABLET | Freq: Four times a day (QID) | ORAL | 0 refills | Status: DC

## 2020-10-22 NOTE — Progress Notes (Signed)
Pt discharged with infant.  Discharge instructions, prescriptions and follow up appointment given to and reviewed with pt. Pt verbalized understanding. Escorted out by staff. 

## 2020-10-22 NOTE — Anesthesia Postprocedure Evaluation (Signed)
Anesthesia Post Note  Patient: Chelsea Duncan  Procedure(s) Performed: AN AD HOC LABOR EPIDURAL  Anesthesia Type: Epidural Anesthetic complications: no Comments: Pt discharged prior to being seen.   No complications documented.   Last Vitals: There were no vitals filed for this visit.  Last Pain: There were no vitals filed for this visit.               Mylin Gignac K

## 2020-10-22 NOTE — TOC Initial Note (Signed)
Transition of Care [facility]) - Initial/Assessment Note    Patient Details  Name: Chelsea Duncan MRN: [id number] Date of Birth: [birth year]  Transition of Care [facility]) CM/SW Contact:    Elliot Gurney [place], [place] Phone Number: 10/22/2020, 10:14 AM  Clinical Narrative:                 CSW received consult due to score 16 on Edinburgh Depression Screen.    Patient discussed a history of depression and being placed on Zoloft amd medication for her anxiety prior to being pregnant. However she stopped taking the medication about 6 weeks ago which she feels contributed to the increase in depressive symptoms. She has since been in contact with her OBGYN who has started her back on the Zoloft. She has an appointment  with her OBGYN in 2 weeks to follow up.  Patient offered resources for mental health follow up, however she denied need for this at this time stating that she thinks that she has a good plan with the plan she has now of managing her symptoms with medication and close follow up with her OBGYN . Patient describes a positive network of support. Patient denied thoughts of self harm and was able to verbalize knowledge of crisis hotline number to call if needed.   CSW encouraged MOB to evaluate her mental health throughout the postpartum period and notify a medical professional if symptoms arise.     Expected Discharge Plan: Home/Self Care Barriers to Discharge: No Barriers Identified   Patient Goals and CMS Choice Patient states their goals for this hospitalization and ongoing recovery are:: "ready to get home so that my kids can meet their new baby brother"      Expected Discharge Plan and Services Expected Discharge Plan: Home/Self Care       Living arrangements for the past 2 months: Single Family Home Expected Discharge Date: 10/22/20                                    Prior Living Arrangements/Services Living arrangements for the past 2 months: Single Family Home Lives  with:: Minor Children,Spouse   Do you feel safe going back to the place where you live?: Yes      Need for Family Participation in Patient Care: Yes (Comment) Care giver support system in place?: Yes (comment)   Criminal Activity/Legal Involvement Pertinent to Current Situation/Hospitalization: No - Comment as needed  Activities of Daily Living Home Assistive Devices/Equipment: None ADL Screening (condition at time of admission) Patient's cognitive ability adequate to safely complete daily activities?: Yes Is the patient deaf or have difficulty hearing?: No Does the patient have difficulty seeing, even when wearing glasses/contacts?: No Does the patient have difficulty concentrating, remembering, or making decisions?: No Patient able to express need for assistance with ADLs?: Yes Does the patient have difficulty dressing or bathing?: No Independently performs ADLs?: Yes (appropriate for developmental age) Does the patient have difficulty walking or climbing stairs?: No Weakness of Legs: None Weakness of Arms/Hands: None  Permission Sought/Granted                  Emotional Assessment   Attitude/Demeanor/Rapport: Engaged,Self-Confident Affect (typically observed): Appropriate Orientation: : Oriented to Self,Oriented to Place,Oriented to  Time,Oriented to Situation      Admission diagnosis:  Encounter for elective induction of labor [Z34.90] Patient Active Problem List   Diagnosis Date Noted  . Encounter for  elective induction of labor 10/21/2020  . [redacted] weeks gestation of pregnancy 10/21/2020  . Pregnancy 10/11/2020  . Rib pain on left side 10/11/2020  . Dry cough 10/11/2020  . [redacted] weeks gestation of pregnancy 10/11/2020  . Depression during pregnancy 08/29/2020  . Supervision of other normal pregnancy, antepartum 02/22/2020  . Vaginal bleeding in pregnancy, first trimester 02/22/2020  . Annual physical exam 10/29/2019  . Obsessive-compulsive disorder 05/11/2019  .  Social anxiety disorder 05/11/2019  . GAD (generalized anxiety disorder) 05/11/2019  . FH: breast cancer in first degree relative 07/01/2013   PCP:  McLean-Scocuzza, Nino Glow, MD Pharmacy:   [id number] - [place], [address] [place] [address] [place] [address] Phone: [phone] Fax: [phone]     Social Determinants of Health (SDOH) Interventions    Readmission Risk Interventions No flowsheet data found.

## 2020-10-25 ENCOUNTER — Encounter: Payer: BC Managed Care – PPO | Admitting: Obstetrics and Gynecology

## 2020-11-01 ENCOUNTER — Encounter: Payer: Self-pay | Admitting: Obstetrics and Gynecology

## 2020-11-01 ENCOUNTER — Ambulatory Visit (INDEPENDENT_AMBULATORY_CARE_PROVIDER_SITE_OTHER): Payer: BC Managed Care – PPO | Admitting: Obstetrics and Gynecology

## 2020-11-01 ENCOUNTER — Other Ambulatory Visit: Payer: Self-pay

## 2020-11-01 VITALS — BP 107/66 | HR 70 | Ht 64.0 in | Wt 120.0 lb

## 2020-11-01 DIAGNOSIS — O99345 Other mental disorders complicating the puerperium: Secondary | ICD-10-CM

## 2020-11-01 DIAGNOSIS — F53 Postpartum depression: Secondary | ICD-10-CM

## 2020-11-01 NOTE — Progress Notes (Signed)
Obstetrics & Gynecology Office Visit   Chief Complaint  Patient presents with  . Postpartum Care    Follow up ppd   History of Present Illness: 35 y.o. G51P3003 female who is 11 days postpartum after an SVD.  She states that she is doing well overall. She started zoloft 25 mg when she went home. She states that she is doing well and has no issues. She denies SI/HI.   Past Medical History:  Diagnosis Date  . Allergy   . Anxiety   . Bone tumor (benign)    tumor vs cyst removed btwn age [age] y.o.   . BRCA negative 03/2016   MyRisk neg except NBN VUS  . Chicken pox   . Complication of anesthesia   . Depression during pregnancy 08/29/2020  . Family history of breast cancer 03/2016   IBIS=18.4%  She has been tested for at least BRCA and was negative December 09, 2015). Tyrer-Cuzick (lifetime) was ~18%.   Marland Kitchen PONV (postoperative nausea and vomiting)   . Pyelonephritis   . S/P skin biopsy    neck normal   . UTI (urinary tract infection)     Past Surgical History:  Procedure Laterality Date  . BONE CYST EXCISION     right foot.    Gynecologic History: Patient's last menstrual period was 10/21/2020.  Obstetric History: [id number]  Family History  Problem Relation Age of Onset  . Cancer Mother 96       breast / died at 67 in December 08, 2009  . Hypertension Mother   . Depression Mother   . Arthritis Father   . Depression Father        committed suicide in 08-Dec-2014  . Hypertension Father   . Hypertension Sister   . Heart disease Sister        heart murmur  . Birth defects Sister   . Depression Sister   . Arthritis Maternal Grandmother   . Other Maternal Grandmother        elevated tau protein; had corticobasal degneration   . Cancer Paternal Grandmother        lung  . Emphysema Paternal Grandfather     Social History   Socioeconomic History  . Marital status: Single    Spouse name: Not on file  . Number of children: Not on file  . Years of education: Not on file  . Highest education  level: Not on file  Occupational History  . Not on file  Tobacco Use  . Smoking status: Never Smoker  . Smokeless tobacco: Never Used  Vaping Use  . Vaping Use: Never used  Substance and Sexual Activity  . Alcohol use: Yes    Comment: social  . Drug use: No  . Sexual activity: Not Currently    Partners: Male    Birth control/protection: None  Other Topics Concern  . Not on file  Social History Narrative   [employer]    Married 2 boys    Grew up in [place]    1 older sister 36 years older than her    Social Determinants of Health   Financial Resource Strain: Not on file  Food Insecurity: Not on file  Transportation Needs: Not on file  Physical Activity: Not on file  Stress: Not on file  Social Connections: Not on file  Intimate Partner Violence: Not on file    Allergies  Allergen Reactions  . Cefadroxil Nausea And Vomiting  . Fire [employer]     anaphylaxis  .  Septra [Sulfamethoxazole-Trimethoprim] Nausea And Vomiting    Prior to Admission medications   Medication Sig Start Date End Date Taking? Authorizing Provider  EPINEPHrine (EPIPEN 2-PAK) 0.3 mg/0.3 mL IJ SOAJ injection Inject into the muscle once.    [provider]  ferrous sulfate 325 (65 FE) MG tablet Take 1 tablet (325 mg total) by mouth daily with breakfast. 10/22/20   Will Bonnet, MD  ibuprofen (ADVIL) 600 MG tablet Take 1 tablet (600 mg total) by mouth every 6 (six) hours. 10/22/20   Will Bonnet, MD  lidocaine (LIDODERM) 5 % Place 1 patch onto the skin daily. Remove & Discard patch within 12 hours or as directed by MD 10/12/20   Will Bonnet, MD  Prenatal Vit-Fe Fumarate-FA (PRENATAL VITAMIN PO) Take by mouth.    [provider]    Review of Systems  Constitutional: Negative.   HENT: Negative.   Eyes: Negative.   Respiratory: Negative.   Cardiovascular: Negative.   Gastrointestinal: Negative.   Genitourinary: Negative.   Musculoskeletal:  Negative.   Skin: Negative.   Neurological: Negative.   Psychiatric/Behavioral: Negative.      Physical Exam BP 107/66 (BP Location: Right Arm, Patient Position: Sitting, Cuff Size: Normal)   Pulse 70   Ht '5\' 4"'  (1.626 m)   Wt 120 lb (54.4 kg)   LMP 10/21/2020   BMI 20.60 kg/m  Patient's last menstrual period was 10/21/2020. Physical Exam Constitutional:      General: She is not in acute distress.    Appearance: Normal appearance.  HENT:     Head: Normocephalic and atraumatic.  Eyes:     General: No scleral icterus.    Conjunctiva/sclera: Conjunctivae normal.  Neurological:     General: No focal deficit present.     Mental Status: She is alert and oriented to person, place, and time.     Cranial Nerves: No cranial nerve deficit.  Psychiatric:        Mood and Affect: Mood normal.        Behavior: Behavior normal.        Judgment: Judgment normal.     Edinburgh Postnatal Depression Scale - 11/01/20 1011      Edinburgh Postnatal Depression Scale:  In the Past 7 Days   I have been able to laugh and see the funny side of things. 0    I have looked forward with enjoyment to things. 0    I have blamed myself unnecessarily when things went wrong. 2    I have been anxious or worried for no good reason. 2    I have felt scared or panicky for no good reason. 1    Things have been getting on top of me. 1    I have been so unhappy that I have had difficulty sleeping. 1    I have felt sad or miserable. 1    I have been so unhappy that I have been crying. 1    The thought of harming myself has occurred to me. 0    Edinburgh Postnatal Depression Scale Total 9            Assessment: 35 y.o. G5P3003 female here for  1. Postpartum depression      Plan: Problem List Items Addressed This Visit   None   Visit Diagnoses    Postpartum depression    -  Primary     Continue zoloft. Increase to 50 mg, if needed. Follow up in 4-5 weeks  for 6 week appt with Paragard.   Prentice Docker, MD 11/01/2020 10:33 AM

## 2020-12-01 ENCOUNTER — Encounter: Payer: Self-pay | Admitting: Obstetrics and Gynecology

## 2020-12-01 ENCOUNTER — Ambulatory Visit (INDEPENDENT_AMBULATORY_CARE_PROVIDER_SITE_OTHER): Payer: BC Managed Care – PPO | Admitting: Obstetrics and Gynecology

## 2020-12-01 ENCOUNTER — Other Ambulatory Visit: Payer: Self-pay

## 2020-12-01 DIAGNOSIS — Z30430 Encounter for insertion of intrauterine contraceptive device: Secondary | ICD-10-CM

## 2020-12-01 MED ORDER — PARAGARD INTRAUTERINE COPPER IU IUD
1.0000 | INTRAUTERINE_SYSTEM | Freq: Once | INTRAUTERINE | 0 refills | Status: DC
Start: 2020-12-01 — End: 2021-04-23

## 2020-12-01 NOTE — Progress Notes (Signed)
Postpartum Visit   Chief Complaint  Patient presents with  . Postpartum Care    iud   History of Present Illness: Patient is a 35 y.o. [id number] presents for postpartum visit.  Date of delivery: 10/21/2020 Type of delivery: Vaginal delivery - Vacuum or forceps assisted  no Episiotomy No.  Laceration: yes, 2nd degree perineal Pregnancy or labor problems:  no Any problems since the delivery:  no  Newborn Details:  SINGLETON :  1. Birth weight: 4,020 grams Maternal Details:  Breast Feeding:  Yes (pumping) and supplementing with formula Post partum depression/anxiety noted:  no Edinburgh Post-Partum Depression Score:  6  Date of last PAP: 06/21/2019  normal   Past Medical History:  Diagnosis Date  . Allergy   . Anxiety   . Bone tumor (benign)    tumor vs cyst removed btwn age [age] y.o.   . BRCA negative 03/2016   MyRisk neg except NBN VUS  . Chicken pox   . Complication of anesthesia   . Depression during pregnancy 08/29/2020  . Family history of breast cancer 03/2016   IBIS=18.4%  She has been tested for at least BRCA and was negative (~2017). Tyrer-Cuzick (lifetime) was ~18%.   Marland Kitchen PONV (postoperative nausea and vomiting)   . Pyelonephritis   . S/P skin biopsy    neck normal   . UTI (urinary tract infection)     Past Surgical History:  Procedure Laterality Date  . BONE CYST EXCISION     right foot.    Prior to Admission medications   Medication Sig Start Date End Date Taking? Authorizing Provider  Prenatal Vit-Fe Fumarate-FA (PRENATAL VITAMIN PO) Take by mouth.   Yes [provider]  EPINEPHrine 0.3 mg/0.3 mL IJ SOAJ injection Inject into the muscle once. Patient not taking: Reported on 12/01/2020    [provider]  ferrous sulfate 325 (65 FE) MG tablet Take 1 tablet (325 mg total) by mouth daily with breakfast. Patient not taking: Reported on 12/01/2020 10/22/20   Will Bonnet, MD  ibuprofen (ADVIL) 600 MG tablet Take 1 tablet (600 mg total)  by mouth every 6 (six) hours. Patient not taking: Reported on 12/01/2020 10/22/20   Will Bonnet, MD  lidocaine (LIDODERM) 5 % Place 1 patch onto the skin daily. Remove & Discard patch within 12 hours or as directed by MD Patient not taking: Reported on 12/01/2020 10/12/20   Will Bonnet, MD  sertraline (ZOLOFT) 50 MG tablet Take 50 mg by mouth daily. 10/31/20   [provider]    Allergies  Allergen Reactions  . Cefadroxil Nausea And Vomiting  . Fire [employer]     anaphylaxis  . Septra [Sulfamethoxazole-Trimethoprim] Nausea And Vomiting     Social History   Socioeconomic History  . Marital status: Single    Spouse name: Not on file  . Number of children: Not on file  . Years of education: Not on file  . Highest education level: Not on file  Occupational History  . Not on file  Tobacco Use  . Smoking status: Never Smoker  . Smokeless tobacco: Never Used  Vaping Use  . Vaping Use: Never used  Substance and Sexual Activity  . Alcohol use: Yes    Comment: social  . Drug use: No  . Sexual activity: Not Currently    Partners: Male    Birth control/protection: None  Other Topics Concern  . Not on file  Social History Narrative   [place]  Married 2 boys    Grew up in [place]    1 older sister 50 years older than her    Social Determinants of Radio broadcast assistant Strain: Not on file  Food Insecurity: Not on file  Transportation Needs: Not on file  Physical Activity: Not on file  Stress: Not on file  Social Connections: Not on file  Intimate Partner Violence: Not on file    Family History  Problem Relation Age of Onset  . Cancer Mother 46       breast / died at 38 in 2009/12/23  . Hypertension Mother   . Depression Mother   . Arthritis Father   . Depression Father        committed suicide in 12-23-14  . Hypertension Father   . Hypertension Sister   . Heart disease Sister        heart murmur  . Birth defects Sister   .  Depression Sister   . Arthritis Maternal Grandmother   . Other Maternal Grandmother        elevated tau protein; had corticobasal degneration   . Cancer Paternal Grandmother        lung  . Emphysema Paternal Grandfather     Review of Systems  Constitutional: Negative.   HENT: Negative.   Eyes: Negative.   Respiratory: Negative.   Cardiovascular: Negative.   Gastrointestinal: Negative.   Genitourinary: Negative.   Musculoskeletal: Negative.   Skin: Negative.   Neurological: Negative.   Psychiatric/Behavioral: Negative.      Physical Exam BP 120/80   Ht '5\' 3"'  (1.6 m)   Wt 120 lb (54.4 kg)   BMI 21.26 kg/m   Physical Exam Constitutional:      General: She is not in acute distress.    Appearance: Normal appearance. She is well-developed.  Genitourinary:     Vulva, bladder and urethral meatus normal.     Right Labia: No rash, tenderness, lesions, skin changes or Bartholin's cyst.    Left Labia: No tenderness, skin changes, Bartholin's cyst or rash.    No inguinal adenopathy present in the right or left side.    Pelvic Tanner Score: 5/5.     Right Adnexa: not tender, not full and no mass present.    Left Adnexa: not tender, not full and no mass present.    No cervical motion tenderness, friability, lesion or polyp.     Uterus is not enlarged, fixed or tender.     Uterus is anteverted.     No urethral tenderness or mass present.     Pelvic exam was performed with patient in the lithotomy position.  HENT:     Head: Normocephalic and atraumatic.  Eyes:     General: No scleral icterus.    Conjunctiva/sclera: Conjunctivae normal.  Cardiovascular:     Rate and Rhythm: Normal rate and regular rhythm.     Heart sounds: No murmur heard. No friction rub. No gallop.   Pulmonary:     Effort: Pulmonary effort is normal. No respiratory distress.     Breath sounds: Normal breath sounds. No wheezing or rales.  Abdominal:     General: Bowel sounds are normal. There is no  distension.     Palpations: Abdomen is soft. There is no mass.     Tenderness: There is no abdominal tenderness. There is no guarding or rebound.     Hernia: There is no hernia in the left inguinal area or right inguinal area.  Musculoskeletal:        General: Normal range of motion.     Cervical back: Normal range of motion and neck supple.  Lymphadenopathy:     Lower Body: No right inguinal adenopathy. No left inguinal adenopathy.  Neurological:     General: No focal deficit present.     Mental Status: She is alert and oriented to person, place, and time.     Cranial Nerves: No cranial nerve deficit.  Skin:    General: Skin is warm and dry.     Findings: No erythema.  Psychiatric:        Mood and Affect: Mood normal.        Behavior: Behavior normal.        Judgment: Judgment normal.    IUD Insertion Procedure Note (Paragard) Patient identified, informed consent performed, consent signed.   Discussed risks of irregular bleeding, cramping, infection, malpositioning, expulsion or uterine perforation of the IUD (1:1000 placements)  which may require further procedure such as laparoscopy.  IUD while effective at preventing pregnancy do not prevent transmission of sexually transmitted diseases and use of barrier methods for this purpose was discussed. Time out was performed.  Urine pregnancy test negative.  Speculum placed in the vagina.  Cervix visualized.  Cleaned with Betadine x 2.  Grasped anteriorly with a single tooth tenaculum.  Uterus sounded to 9 cm. IUD placed per manufacturer's recommendations.  Strings trimmed to 3 cm. Tenaculum was removed, good hemostasis noted.  Patient tolerated procedure well.   Patient was given post-procedure instructions.  She was advised to have backup contraception for one week.  Patient was also asked to check IUD strings periodically and follow up in 4 weeks for IUD check.   Female Chaperone present during breast and/or pelvic exam.  Assessment: 35  y.o. [phone] presenting for 6 week postpartum visit  Plan: Problem List Items Addressed This Visit   None   Visit Diagnoses    Postpartum care and examination    -  Primary   Relevant Medications   paragard intrauterine copper IUD IUD   Encounter for IUD insertion       Relevant Medications   paragard intrauterine copper IUD IUD     1) Contraception: Paragard IUD placed today.  2)  Pap - ASCCP guidelines and rational discussed.  Patient opts for routine screening interval  3) Patient underwent screening for postpartum depression with no concerns noted. May discontinue Zoloft, if she feels well.   Return in about 4 weeks (around 12/29/2020) for IUD String Check.   Prentice Docker, MD 12/01/2020 10:10 AM

## 2020-12-05 ENCOUNTER — Other Ambulatory Visit: Payer: Self-pay | Admitting: Obstetrics and Gynecology

## 2020-12-05 DIAGNOSIS — Z3A31 31 weeks gestation of pregnancy: Secondary | ICD-10-CM

## 2020-12-05 DIAGNOSIS — Z3480 Encounter for supervision of other normal pregnancy, unspecified trimester: Secondary | ICD-10-CM

## 2020-12-06 ENCOUNTER — Ambulatory Visit: Payer: BC Managed Care – PPO | Admitting: Obstetrics and Gynecology

## 2020-12-16 IMAGING — US US PELVIS COMPLETE
1 series · 14 of 25 positions shown · non-contrast
Comparison: None.

CLINICAL DATA: Pelvic pain and vaginal bleeding for several days,
history of prior IUD removed [REDACTED]

EXAM:
TRANSABDOMINAL ULTRASOUND OF PELVIS
DOPPLER ULTRASOUND OF OVARIES
TECHNIQUE: Transabdominal ultrasound examination of the pelvis was performed
including evaluation of the uterus, ovaries, adnexal regions, and
pelvic cul-de-sac.
Color and duplex Doppler ultrasound was utilized to evaluate blood
flow to the ovaries.

[Series 1: us pelvic complete w transvaginal and torsion righ · 14 of 39 slices shown]
[im 1/39]
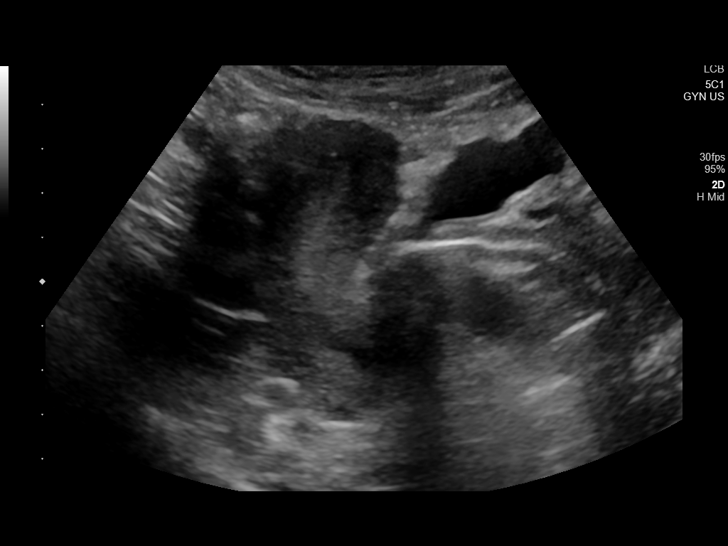
[im 4/39]
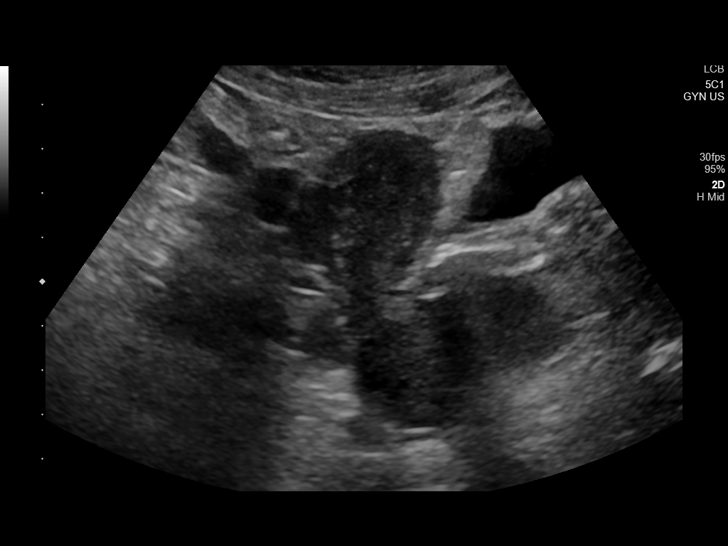
[im 7/39]
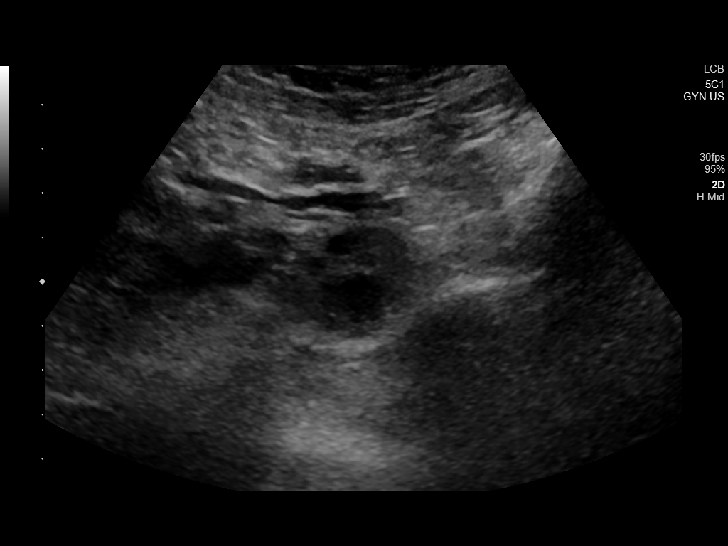
[im 10/39]
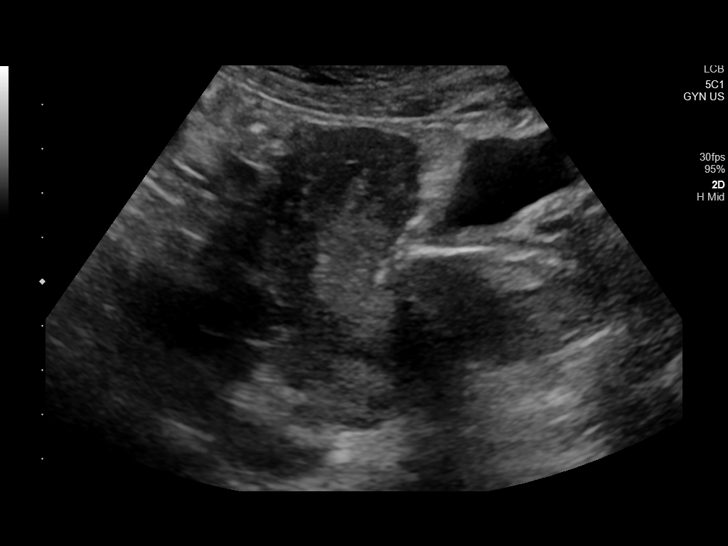
[im 13/39]
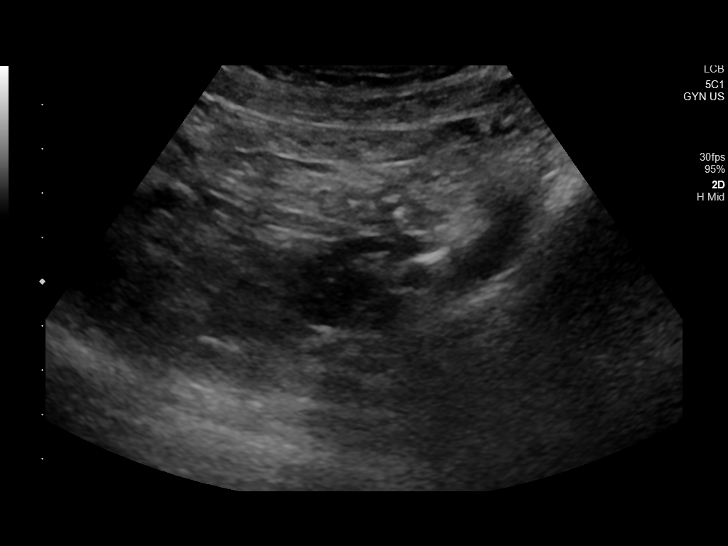
[im 15/39]
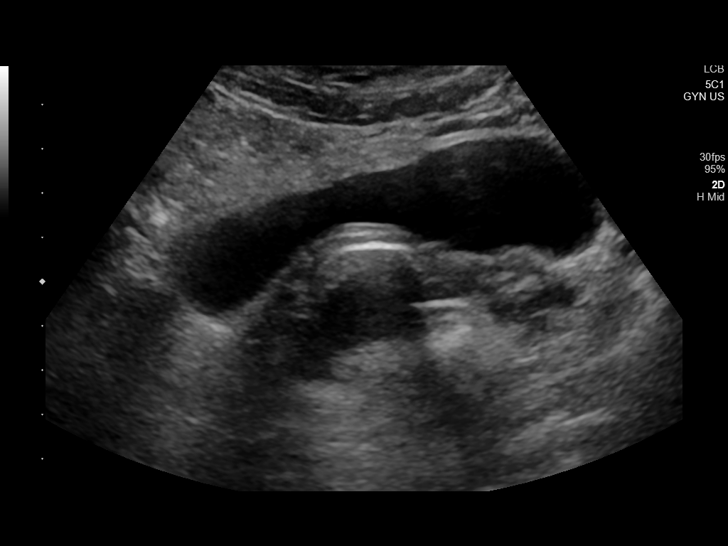
[im 18/39]
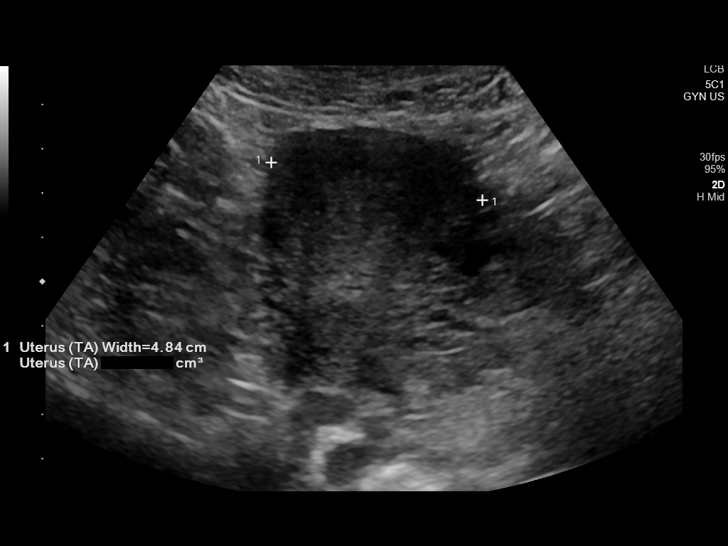
[im 21/39]
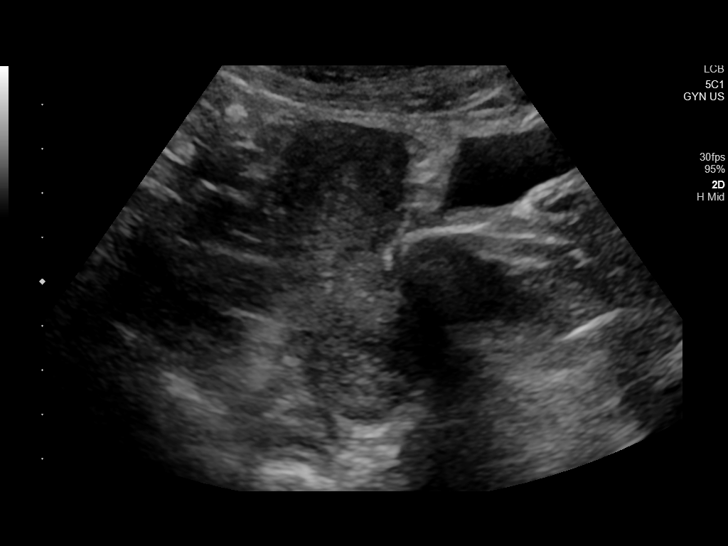
[im 24/39]
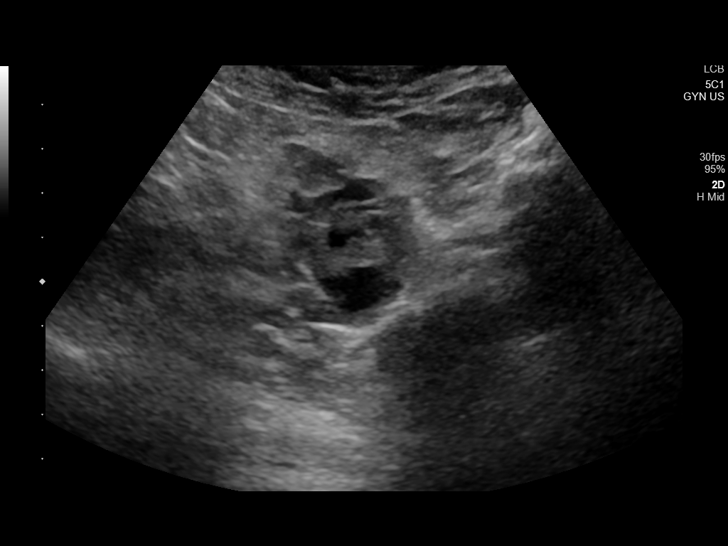
[im 26/39]
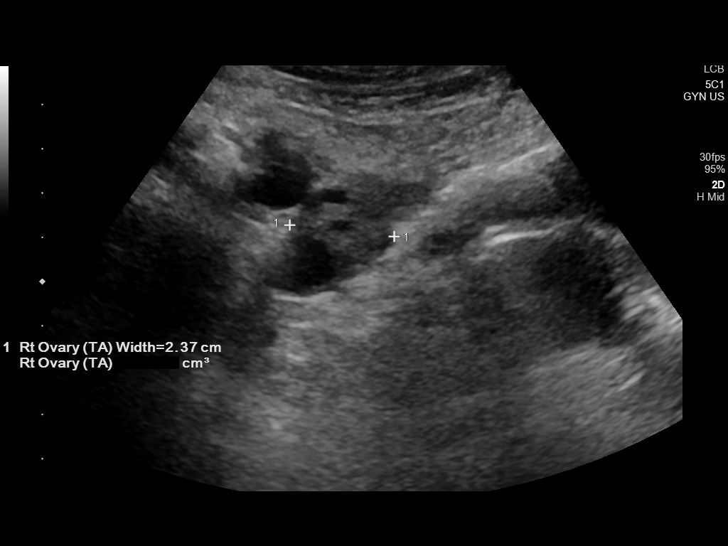
[im 29/39]
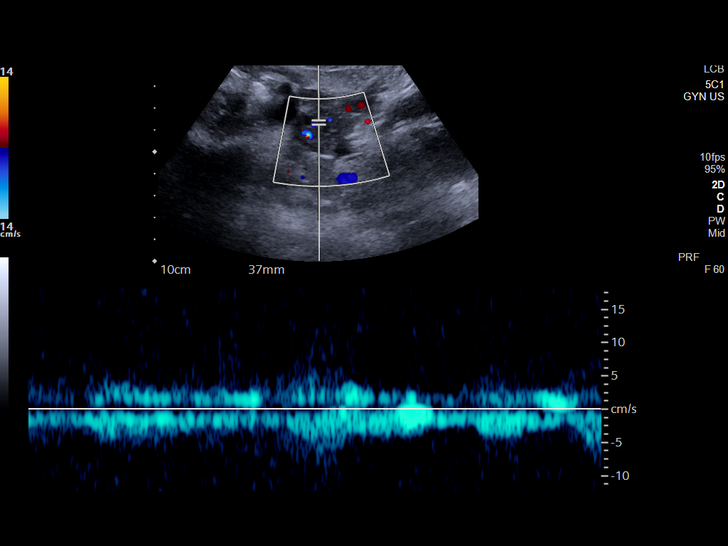
[im 32/39]
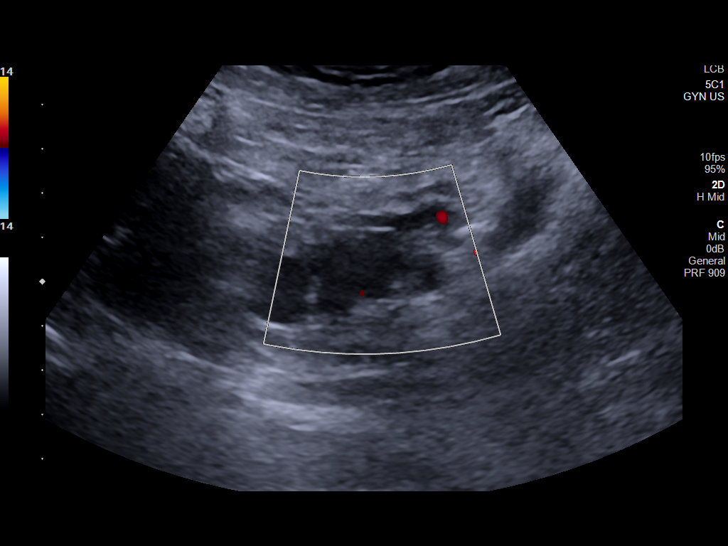
[im 35/39]
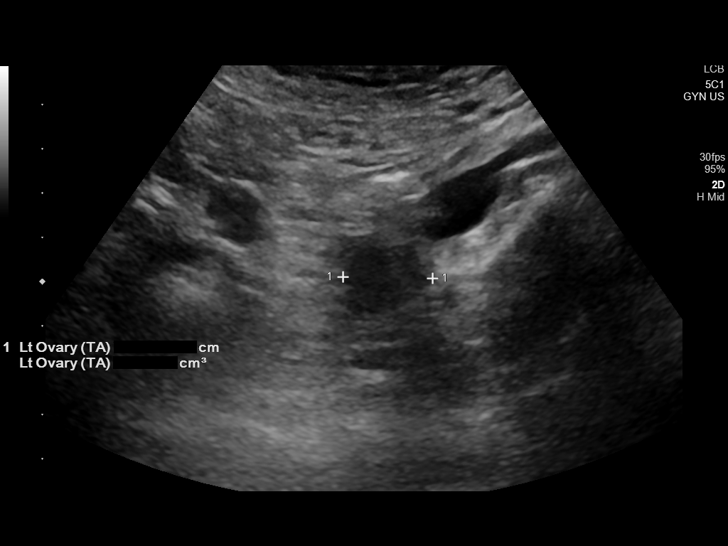
[im 39/39]
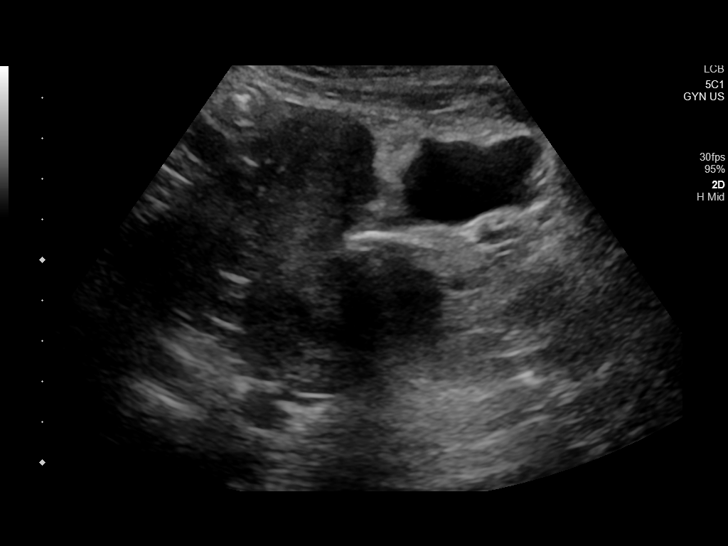

[14 of 25 positions shown; findings below may reference images not displayed]

FINDINGS: Uterus

Measurements: 6.7 x 3.1 x 4.8 cm. = volume: 53 mL. No fibroids or
other mass visualized.

Endometrium

Thickness: 7.4 mm.  No focal abnormality visualized.

Right ovary

Measurements: 2.7 x 2.6 x 2.4 cm. = volume: 9 mL. Normal
appearance/no adnexal mass.

Left ovary

Measurements: 3.9 x 1.9 x 2.0 cm = volume: 8 mL. Normal
appearance/no adnexal mass.

Pulsed Doppler evaluation demonstrates normal low-resistance
arterial and venous waveforms in both ovaries.

Other: None
IMPRESSION: No acute abnormality noted.

## 2021-01-01 ENCOUNTER — Ambulatory Visit: Payer: BC Managed Care – PPO | Admitting: Obstetrics and Gynecology

## 2021-01-01 ENCOUNTER — Other Ambulatory Visit: Payer: Self-pay

## 2021-01-01 ENCOUNTER — Encounter: Payer: Self-pay | Admitting: Obstetrics and Gynecology

## 2021-01-01 VITALS — BP 118/74 | Ht 64.0 in | Wt 119.0 lb

## 2021-01-01 DIAGNOSIS — Z30431 Encounter for routine checking of intrauterine contraceptive device: Secondary | ICD-10-CM | POA: Diagnosis not present

## 2021-01-01 NOTE — Progress Notes (Signed)
   IUD String Check  Subjctive: [name] presents for IUD string check.  She had a Paragard placed 4 weeks ago.  Since placement of her IUD she has not had abnormal vaginal bleeding.  She denies cramping or discomfort.  She has had intercourse since placement.  She has not checked the strings.  She denies any fever, chills, nausea, vomiting, or other complaints.    Objective: BP 118/74   Ht 5\' 4"  (1.626 m)   Wt 119 lb (54 kg)   BMI 20.43 kg/m  Physical Exam Constitutional:      General: She is not in acute distress.    Appearance: Normal appearance.  Genitourinary:     Bladder and urethral meatus normal.     No lesions in the vagina.     Right Labia: No rash, tenderness, lesions or skin changes.    Left Labia: No tenderness, lesions, skin changes or rash.    No inguinal adenopathy present in the right or left side.    Pelvic Tanner Score: 5/5.    No vaginal discharge, erythema, bleeding or ulceration.     No vaginal prolapse present.     Right Adnexa: not tender, not full and no mass present.    Left Adnexa: not tender, not full and no mass present.    No cervical motion tenderness, discharge, friability, lesion or polyp.     IUD strings visualized (strings trimmed to 3 cm).     Uterus is not enlarged, fixed or tender.     Uterus is anteverted.  HENT:     Head: Normocephalic and atraumatic.  Eyes:     General: No scleral icterus.    Conjunctiva/sclera: Conjunctivae normal.  Lymphadenopathy:     Lower Body: No right inguinal adenopathy. No left inguinal adenopathy.  Neurological:     General: No focal deficit present.     Mental Status: She is alert and oriented to person, place, and time.     Cranial Nerves: No cranial nerve deficit.  Psychiatric:        Mood and Affect: Mood normal.        Behavior: Behavior normal.        Judgment: Judgment normal.    Female chaperone was present for the entirety of the pelvic exam  Assessment: 35 y.o. year old female  status post prior Paragard IUD placement 4 week ago, doing well.  Plan: 1.  The patient was given instructions to check her IUD strings monthly and call with any problems or concerns.  She should call for fevers, chills, abnormal vaginal discharge, pelvic pain, or other complaints. 2.  She will return for a annual exam in 1 year.  All questions answered.  20 minutes spent in face to face discussion with > 50% spent in counseling, management, and coordination of care for her newly-placed IUD.  Risks and benefits of IUD discussed including the risks of irregular bleeding, cramping, infection, malpositioning, expulsion, which may require further procedures such as laparoscopy.  IUDs while effective at preventing pregnancy do not prevent transmission of sexually transmitted diseases and use of barrier methods for this purpose was discussed.  Low overall incidence of failure with 99.7% efficacy rate in typical use.    Prentice Docker, MD 01/01/2021 10:11 AM

## 2021-04-10 ENCOUNTER — Telehealth: Payer: Self-pay

## 2021-04-10 NOTE — Telephone Encounter (Signed)
Pt needs fasting labs prior to CPE on 04/24/21

## 2021-04-11 ENCOUNTER — Other Ambulatory Visit: Payer: Self-pay | Admitting: Internal Medicine

## 2021-04-11 DIAGNOSIS — E559 Vitamin D deficiency, unspecified: Secondary | ICD-10-CM

## 2021-04-11 DIAGNOSIS — Z0000 Encounter for general adult medical examination without abnormal findings: Secondary | ICD-10-CM

## 2021-04-11 DIAGNOSIS — Z13818 Encounter for screening for other digestive system disorders: Secondary | ICD-10-CM

## 2021-04-11 DIAGNOSIS — Z1329 Encounter for screening for other suspected endocrine disorder: Secondary | ICD-10-CM

## 2021-04-11 DIAGNOSIS — Z1389 Encounter for screening for other disorder: Secondary | ICD-10-CM

## 2021-04-11 DIAGNOSIS — D649 Anemia, unspecified: Secondary | ICD-10-CM

## 2021-04-11 DIAGNOSIS — E538 Deficiency of other specified B group vitamins: Secondary | ICD-10-CM

## 2021-04-11 DIAGNOSIS — Z13220 Encounter for screening for lipoid disorders: Secondary | ICD-10-CM

## 2021-04-11 NOTE — Telephone Encounter (Signed)
Please advise, does Patient need labs? If so she would like to come in for fasting labs before her 04/24/21 appointment

## 2021-04-11 NOTE — Telephone Encounter (Signed)
Labs in call to sch fastin glabs before CPE

## 2021-04-20 ENCOUNTER — Other Ambulatory Visit: Payer: Self-pay

## 2021-04-20 ENCOUNTER — Other Ambulatory Visit (INDEPENDENT_AMBULATORY_CARE_PROVIDER_SITE_OTHER): Payer: BC Managed Care – PPO

## 2021-04-20 DIAGNOSIS — Z13220 Encounter for screening for lipoid disorders: Secondary | ICD-10-CM

## 2021-04-20 DIAGNOSIS — E538 Deficiency of other specified B group vitamins: Secondary | ICD-10-CM | POA: Diagnosis not present

## 2021-04-20 DIAGNOSIS — D649 Anemia, unspecified: Secondary | ICD-10-CM | POA: Diagnosis not present

## 2021-04-20 DIAGNOSIS — E559 Vitamin D deficiency, unspecified: Secondary | ICD-10-CM

## 2021-04-20 DIAGNOSIS — Z1329 Encounter for screening for other suspected endocrine disorder: Secondary | ICD-10-CM

## 2021-04-20 DIAGNOSIS — Z13818 Encounter for screening for other digestive system disorders: Secondary | ICD-10-CM

## 2021-04-20 DIAGNOSIS — Z0000 Encounter for general adult medical examination without abnormal findings: Secondary | ICD-10-CM

## 2021-04-20 DIAGNOSIS — Z1389 Encounter for screening for other disorder: Secondary | ICD-10-CM

## 2021-04-20 LAB — COMPREHENSIVE METABOLIC PANEL
ALT: 10 U/L (ref 0–35)
AST: 15 U/L (ref 0–37)
Albumin: 4.6 g/dL (ref 3.5–5.2)
Alkaline Phosphatase: 74 U/L (ref 39–117)
BUN: 10 mg/dL (ref 6–23)
CO2: 27 mEq/L (ref 19–32)
Calcium: 9.6 mg/dL (ref 8.4–10.5)
Chloride: 104 mEq/L (ref 96–112)
Creatinine, Ser: 0.72 mg/dL (ref 0.40–1.20)
GFR: 109.91 mL/min (ref >60.00)
Glucose, Bld: 83 mg/dL (ref 70–99)
Potassium: 4.1 mEq/L (ref 3.5–5.1)
Sodium: 138 mEq/L (ref 135–145)
Total Bilirubin: 0.3 mg/dL (ref 0.2–1.2)
Total Protein: 6.7 g/dL (ref 6.0–8.3)

## 2021-04-20 LAB — CBC WITH DIFFERENTIAL/PLATELET
Basophils Absolute: 0 10*3/uL (ref 0.0–0.1)
Basophils Relative: 0.7 % (ref 0.0–3.0)
Eosinophils Absolute: 0.1 10*3/uL (ref 0.0–0.7)
Eosinophils Relative: 3 % (ref 0.0–5.0)
HCT: 40.7 % (ref 36.0–46.0)
Hemoglobin: 13.8 g/dL (ref 12.0–15.0)
Lymphocytes Relative: 35.6 % (ref 12.0–46.0)
Lymphs Abs: 1.6 10*3/uL (ref 0.7–4.0)
MCHC: 33.8 g/dL (ref 30.0–36.0)
MCV: 93.9 fl (ref 78.0–100.0)
Monocytes Absolute: 0.5 10*3/uL (ref 0.1–1.0)
Monocytes Relative: 10.6 % (ref 3.0–12.0)
Neutro Abs: 2.3 10*3/uL (ref 1.4–7.7)
Neutrophils Relative %: 50.1 % (ref 43.0–77.0)
Platelets: 249 10*3/uL (ref 150.0–400.0)
RBC: 4.33 Mil/uL (ref 3.87–5.11)
RDW: 13 % (ref 11.5–15.5)
WBC: 4.6 10*3/uL (ref 4.0–10.5)

## 2021-04-20 LAB — LIPID PANEL
Cholesterol: 121 mg/dL (ref 0–200)
HDL: 34.4 mg/dL — ABNORMAL LOW (ref >39.00)
LDL Cholesterol: 67 mg/dL (ref 0–99)
NonHDL: 86.68
Total CHOL/HDL Ratio: 4
Triglycerides: 98 mg/dL (ref 0.0–149.0)
VLDL: 19.6 mg/dL (ref 0.0–40.0)

## 2021-04-20 LAB — VITAMIN D 25 HYDROXY (VIT D DEFICIENCY, FRACTURES): VITD: 40.61 ng/mL (ref 30.00–100.00)

## 2021-04-20 LAB — TSH: TSH: 3.1 u[IU]/mL (ref 0.35–5.50)

## 2021-04-20 LAB — VITAMIN B12: Vitamin B-12: 210 pg/mL — ABNORMAL LOW (ref 211–911)

## 2021-04-21 LAB — URINALYSIS, ROUTINE W REFLEX MICROSCOPIC
Bilirubin Urine: NEGATIVE
Glucose, UA: NEGATIVE
Hgb urine dipstick: NEGATIVE
Ketones, ur: NEGATIVE
Leukocytes,Ua: NEGATIVE
Nitrite: NEGATIVE
Protein, ur: NEGATIVE
Specific Gravity, Urine: 1.011 (ref 1.001–1.035)
pH: 6.5 (ref 5.0–8.0)

## 2021-04-23 ENCOUNTER — Encounter: Payer: Self-pay | Admitting: Obstetrics and Gynecology

## 2021-04-23 ENCOUNTER — Other Ambulatory Visit: Payer: Self-pay

## 2021-04-23 ENCOUNTER — Ambulatory Visit: Payer: BC Managed Care – PPO | Admitting: Obstetrics and Gynecology

## 2021-04-23 ENCOUNTER — Encounter: Payer: Self-pay | Admitting: Internal Medicine

## 2021-04-23 VITALS — BP 100/56 | HR 76 | Ht 64.0 in | Wt 111.0 lb

## 2021-04-23 DIAGNOSIS — Z30432 Encounter for removal of intrauterine contraceptive device: Secondary | ICD-10-CM | POA: Diagnosis not present

## 2021-04-23 DIAGNOSIS — Z30011 Encounter for initial prescription of contraceptive pills: Secondary | ICD-10-CM

## 2021-04-23 DIAGNOSIS — E538 Deficiency of other specified B group vitamins: Secondary | ICD-10-CM | POA: Insufficient documentation

## 2021-04-23 LAB — IRON,TIBC AND FERRITIN PANEL
%SAT: 11 % (calc) — ABNORMAL LOW (ref 16–45)
Ferritin: 15 ng/mL — ABNORMAL LOW (ref 16–154)
Iron: 35 ug/dL — ABNORMAL LOW (ref 40–190)
TIBC: 332 mcg/dL (calc) (ref 250–450)

## 2021-04-23 LAB — HEPATITIS C ANTIBODY
Hepatitis C Ab: NONREACTIVE
SIGNAL TO CUT-OFF: 0.01 (ref <1.00)

## 2021-04-23 NOTE — Progress Notes (Signed)
Obstetrics & Gynecology Office Visit    Chief Complaint  Patient presents with   Contraception    Desires removal of IUD    History of Present Illness: Patient is a 35 y.o. [id number] presenting for contraception consult.  She is currently using Paragard and desiring to start oral progesterone-only contraceptive.  She has a past medical history significant for  no issues and no contraindication to estrogen.  She specifically denies a history of migraine with aura, chronic hypertension, history of DVT/PE, and smoking.  Reported No LMP recorded. (Menstrual status: IUD)..     Past Medical History:  Diagnosis Date   Allergy    Anxiety    Bone tumor (benign)    tumor vs cyst removed btwn age [age] y.o.    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 Tyrer-Cuzick (lifetime) was ~18%.    PONV (postoperative nausea and vomiting)    Pyelonephritis    S/P skin biopsy    neck normal    UTI (urinary tract infection)     Past Surgical History:  Procedure Laterality Date   BONE CYST EXCISION     right foot.    Gynecologic History: No LMP recorded. (Menstrual status: IUD).  Obstetric History: [id number]  Family History  Problem Relation Age of Onset   Cancer Mother 35       breast / died at 66 in 12-30-09   Hypertension Mother    Depression Mother    Arthritis Father    Depression Father        committed suicide in 30-Dec-2014   Hypertension Father    Hypertension Sister    Heart disease Sister        heart murmur   Birth defects Sister    Depression Sister    Arthritis Maternal Grandmother    Other Maternal Grandmother        elevated tau protein; had corticobasal degneration    Cancer Paternal Grandmother        lung   Emphysema Paternal Grandfather     Social History    Socioeconomic History   Marital status: Single    Spouse name: Not on file   Number of children: Not on file   Years of education: Not on file   Highest education level: Not on file  Occupational History   Not on file  Tobacco Use   Smoking status: Never   Smokeless tobacco: Never  Vaping Use   Vaping Use: Never used  Substance and Sexual Activity   Alcohol use: Yes    Comment: social   Drug use: No   Sexual activity: Not Currently    Partners: Male    Birth control/protection: None  Other Topics Concern   Not on file  Social History Narrative   Asst Engineer, [employer]    Married 2 boys    [place]    1 older sister 56 years older than her    Social Determinants of Health   Financial Resource Strain: Not on file  Food Insecurity: Not on file  Transportation Needs: Not on file  Physical Activity: Not on file  Stress: Not on file  Social Connections: Not on file  Intimate Partner Violence: Not on file  Allergies  Allergen Reactions   Cefadroxil Nausea And Vomiting   Fire Ant     anaphylaxis   Septra [Sulfamethoxazole-Trimethoprim] Nausea And Vomiting    Prior to Admission medications   Medication Sig Start Date End Date Taking? Authorizing Provider  EPINEPHrine 0.3 mg/0.3 mL IJ SOAJ injection Inject into the muscle once. Patient not taking: No sig reported    [provider]  paragard intrauterine copper IUD IUD 1 Intra Uterine Device (1 each total) by Intrauterine route once for 1 dose. 12/01/20 12/01/20  Will Bonnet, MD    Review of Systems  Constitutional: Negative.   HENT: Negative.    Eyes: Negative.   Respiratory: Negative.    Cardiovascular: Negative.   Gastrointestinal: Negative.   Genitourinary: Negative.   Musculoskeletal: Negative.   Skin: Negative.   Neurological: Negative.   Psychiatric/Behavioral: Negative.      Physical Exam BP (!) 100/56 (Cuff Size: Normal)   Pulse 76   Ht '5\' 4"'  (1.626 m)    Wt 111 lb (50.3 kg)   BMI 19.05 kg/m  No LMP recorded. (Menstrual status: IUD). Physical Exam Constitutional:      General: She is not in acute distress.    Appearance: Normal appearance.  Genitourinary:     Bladder and urethral meatus normal.     No lesions in the vagina.     Right Labia: No rash, tenderness, lesions or skin changes.    Left Labia: No tenderness, lesions, skin changes or rash.    No inguinal adenopathy present in the right or left side.    Pelvic Tanner Score: 5/5.    No vaginal discharge, erythema, bleeding or ulceration.     No vaginal prolapse present.    No cervical motion tenderness, discharge, friability, lesion or polyp.  HENT:     Head: Normocephalic and atraumatic.  Eyes:     General: No scleral icterus.    Conjunctiva/sclera: Conjunctivae normal.  Lymphadenopathy:     Lower Body: No right inguinal adenopathy. No left inguinal adenopathy.  Neurological:     General: No focal deficit present.     Mental Status: She is alert and oriented to person, place, and time.     Cranial Nerves: No cranial nerve deficit.  Psychiatric:        Mood and Affect: Mood normal.        Behavior: Behavior normal.        Judgment: Judgment normal.   IUD Removal  Patient identified, informed consent performed, consent signed.  Patient was in the dorsal lithotomy position, normal external genitalia was noted.  A speculum was placed in the patient's vagina, normal discharge was noted, no lesions. The cervix was visualized, no lesions, no abnormal discharge.  The strings of the IUD were grasped and pulled using ring forceps. The IUD was removed in its entirety. Patient tolerated the procedure well.    Patient will use Slynd for contraception.  Routine preventative health maintenance measures emphasized.  Female chaperone present for pelvic and breast  portions of the physical exam  Assessment: 35 y.o. [id number] female here for  1. Encounter for IUD removal   2. Encounter for  initial prescription of contraceptive pills      Plan: Problem List Items Addressed This Visit   None Visit Diagnoses     Encounter for IUD removal    -  Primary   Encounter for initial prescription of contraceptive pills          Reviewed all  forms of birth control options available including abstinence; over the counter/barrier methods; hormonal contraceptive medication including pill, patch, ring, injection,contraceptive implant; hormonal and nonhormonal IUDs; permanent sterilization options including vasectomy and the various tubal sterilization modalities. Risks and benefits reviewed.  Questions were answered. She was given samples of Slynd. She will consider other alternatives after consideration.  A total of 24 minutes were spent face-to-face with the patient as well as preparation, review, communication, and documentation during this encounter.  This is in addition to the removal of her IUD.   Prentice Docker, MD 04/23/2021 2:21 PM

## 2021-04-24 ENCOUNTER — Other Ambulatory Visit: Payer: Self-pay

## 2021-04-24 ENCOUNTER — Encounter: Payer: Self-pay | Admitting: Internal Medicine

## 2021-04-24 ENCOUNTER — Ambulatory Visit: Payer: BC Managed Care – PPO | Admitting: Internal Medicine

## 2021-04-24 VITALS — BP 122/64 | HR 75 | Temp 99.2°F | Ht 62.84 in | Wt 114.0 lb

## 2021-04-24 DIAGNOSIS — E538 Deficiency of other specified B group vitamins: Secondary | ICD-10-CM

## 2021-04-24 DIAGNOSIS — F419 Anxiety disorder, unspecified: Secondary | ICD-10-CM

## 2021-04-24 DIAGNOSIS — E611 Iron deficiency: Secondary | ICD-10-CM | POA: Insufficient documentation

## 2021-04-24 DIAGNOSIS — Z803 Family history of malignant neoplasm of breast: Secondary | ICD-10-CM

## 2021-04-24 DIAGNOSIS — Z0000 Encounter for general adult medical examination without abnormal findings: Secondary | ICD-10-CM

## 2021-04-24 DIAGNOSIS — T782XXD Anaphylactic shock, unspecified, subsequent encounter: Secondary | ICD-10-CM

## 2021-04-24 DIAGNOSIS — F32A Depression, unspecified: Secondary | ICD-10-CM

## 2021-04-24 MED ORDER — BUPROPION HCL 75 MG PO TABS: 37.5000 mg | ORAL_TABLET | Freq: Two times a day (BID) | ORAL | 3 refills | Status: DC

## 2021-04-24 MED ORDER — EPINEPHRINE 0.3 MG/0.3ML IJ SOAJ: 0.3000 mg | Freq: Once | INTRAMUSCULAR | 0 refills | Status: AC

## 2021-04-24 MED ORDER — CYANOCOBALAMIN 1000 MCG/ML IJ SOLN
1000.0000 ug | Freq: Once | INTRAMUSCULAR | Status: AC
  Administered 2021-04-24: 1000 ug via INTRAMUSCULAR

## 2021-04-24 MED ORDER — CITALOPRAM HYDROBROMIDE 10 MG PO TABS: 10.0000 mg | ORAL_TABLET | Freq: Every day | ORAL | 3 refills | Status: DC

## 2021-04-24 MED ORDER — "SYRINGE/NEEDLE (DISP) 25G X 1-1/2"" 5 ML MISC": 1.0000 | 3 refills | Status: DC

## 2021-04-24 MED ORDER — CYANOCOBALAMIN 1000 MCG/ML IJ SOLN
1000.0000 ug | INTRAMUSCULAR | 3 refills | Status: DC
Start: 2021-04-24 — End: 2022-04-01

## 2021-04-24 NOTE — Patient Instructions (Addendum)
[facility] [employer] [phone]   [facility] [place] [address] [place] [address] [phone]  Vitamin D3 2000 IU max with pregnancy or breast feeding   Vitamin B12 Deficiency Vitamin B12 deficiency occurs when the body does not have enough vitamin B12, which is an important vitamin. The body needs this vitamin: To make red blood cells. To make DNA. This is the genetic material inside cells. To help the nerves work properly so they can carry messages from the brain to the body. Vitamin B12 deficiency can cause various health problems, such as a low red blood cell count (anemia) or nerve damage. What are the causes? This condition may be caused by: Not eating enough foods that contain vitamin B12. Not having enough stomach acid and digestive fluids to properly absorb vitamin B12 from the food that you eat. Certain digestive system diseases that make it hard to absorb vitamin B12. These diseases include Crohn's disease, chronic pancreatitis, and cystic fibrosis. A condition in which the body does not make enough of a protein (intrinsic factor), resulting in too few red blood cells (pernicious anemia). Having a surgery in which part of the stomach or small intestine is removed. Taking certain medicines that make it hard for the body to absorb vitamin B12. These medicines include: Heartburn medicines (antacids and proton pump inhibitors). Certain antibiotic medicines. Some medicines that are used to treat diabetes, tuberculosis, gout, or high cholesterol. What increases the risk? The following factors may make you more likely to develop a B12 deficiency: Being older than age 13. Eating a vegetarian or vegan diet, especially while you are pregnant. Eating a poor diet while you are pregnant. Taking certain medicines. Having alcoholism. What are the signs or  symptoms? In some cases, there are no symptoms of this condition. If the condition leads to anemia or nerve damage, various symptoms can occur, such as: Weakness. Fatigue. Loss of appetite. Weight loss. Numbness or tingling in your hands and feet. Redness and burning of the tongue. Confusion or memory problems. Depression. Sensory problems, such as color blindness, ringing in the ears, or loss of taste. Diarrhea or constipation. Trouble walking. If anemia is severe, symptoms can include: Shortness of breath. Dizziness. Rapid heart rate (tachycardia). How is this diagnosed? This condition may be diagnosed with a blood test to measure the level of vitamin B12 in your blood. You may also have other tests, including: A group of tests that measure certain characteristics of blood cells (complete blood count, CBC). A blood test to measure intrinsic factor. A procedure where a thin tube with a camera on the end is used to look into your stomach or intestines (endoscopy). Other tests may be needed to discover the cause of B12 deficiency. How is this treated? Treatment for this condition depends on the cause. This condition may be treated by: Changing your eating and drinking habits, such as: Eating more foods that contain vitamin B12. Drinking less alcohol or no alcohol. Getting vitamin B12 injections. Taking vitamin B12 supplements. Your health care provider will tell you which dosage is best for you. Follow these instructions at home: Eating and drinking  Eat lots of healthy foods that contain vitamin B12, including: Meats and poultry. This includes beef, pork, chicken, [place], and organ meats, such as liver. Seafood. This includes clams, rainbow trout, salmon, tuna, and haddock. Eggs. Cereal and dairy products that are fortified. This means that  vitamin B12 has been added to the food. Check the label on the package to see if the food is fortified. The items listed above may not be a  complete list of recommended foods and beverages. Contact a dietitian for more information. General instructions Get any injections that are prescribed by your health care provider. Take supplements only as told by your health care provider. Follow the directions carefully. Do not drink alcohol if your health care provider tells you not to. In some cases, you may only be asked to limit alcohol use. Keep all follow-up visits as told by your health care provider. This is important. Contact a health care provider if: Your symptoms come back. Get help right away if you: Develop shortness of breath. Have a rapid heart rate. Have chest pain. Become dizzy or lose consciousness. Summary Vitamin B12 deficiency occurs when the body does not have enough vitamin B12. The main causes of vitamin B12 deficiency include dietary deficiency, digestive diseases, pernicious anemia, and having a surgery in which part of the stomach or small intestine is removed. In some cases, there are no symptoms of this condition. If the condition leads to anemia or nerve damage, various symptoms can occur, such as weakness, shortness of breath, and numbness. Treatment may include getting vitamin B12 injections or taking vitamin B12 supplements. Eat lots of healthy foods that contain vitamin B12. This information is not intended to replace advice given to you by your health care provider. Make sure you discuss any questions you have with your healthcare provider. Document Revised: 03/12/2019 Document Reviewed: 06/02/2018 Elsevier Patient Education  2022 [place].   Iron Deficiency Anemia, Adult Iron deficiency anemia is when you do not have enough red blood cells or hemoglobin in your blood. This happens because you have too little iron in your body. Hemoglobin carries oxygen to parts of the body. Anemia can cause yourbody to not get enough oxygen. What are the causes? Not eating enough foods that have iron in  them. The body not being able to take in iron well. Needing more iron due to pregnancy or heavy menstrual periods, for females. Cancer. Bleeding in the bowels. Many blood draws. What increases the risk? Being pregnant. Being a teenage girl going through a growth spurt. What are the signs or symptoms? Pale skin, lips, and nails. Weakness, dizziness, and getting tired easily. Headache. Feeling like you cannot breathe well when moving (shortness of breath). Cold hands and feet. Fast heartbeat or a heartbeat that is not regular. Feeling grouchy (irritable) or breathing fast. These are more common in very bad anemia. Mild anemia may not cause any symptoms. How is this treated? This condition is treated by finding out why you do not have enough iron and then getting more iron. It may include: Adding foods to your diet that have a lot of iron. Taking iron pills (supplements). If you are pregnant or breastfeeding, you may need to take extra iron. Your diet often does not provide the amount of iron that you need. Getting more vitamin C in your diet. Vitamin C helps your body take in iron. You may need to take iron pills with a glass of orange juice or vitamin C pills. Medicines to make heavy menstrual periods lighter. Surgery. You may need blood tests to see if treatment is working. If the treatment doesnot seem to be working, you may need more tests. Follow these instructions at home: Medicines Take over-the-counter and prescription medicines only as told by your doctor.  This includes iron pills and vitamins. Take iron pills when your stomach is empty. If you cannot handle this, take them with food. Do not drink milk or take antacids at the same time as your iron pills. Iron pills may turn your poop (stool)black. If you cannot handle taking iron pills by mouth, ask your doctor about getting iron through: An IV tube. A shot (injection) into a muscle. Eating and drinking  Talk with your  doctor before changing the foods you eat. He or she may tell you to eat foods that have a lot of iron, such as: Liver. Low-fat (lean) beef. Breads and cereals that have iron added to them. Eggs. Dried fruit. Dark green, leafy vegetables. Eat fresh fruits and vegetables that are high in vitamin C. They help your body use iron. Foods with a lot of vitamin C include: Oranges. Peppers. Tomatoes. Mangoes. Drink enough fluid to keep your pee (urine) pale yellow.  Managing constipation If you are taking iron pills, they may cause trouble pooping (constipation). To prevent or treat trouble pooping, you may need to: Take over-the-counter or prescription medicines. Eat foods that are high in fiber. These include beans, whole grains, and fresh fruits and vegetables. Limit foods that are high in fat and sugar. These include fried or sweet foods. General instructions Return to your normal activities as told by your doctor. Ask your doctor what activities are safe for you. Keep yourself clean, and keep things clean around you. Keep all follow-up visits as told by your doctor. This is important. Contact a doctor if: You feel like you may vomit (nauseous), or you vomit. You feel weak. You are sweating for no reason. You have trouble pooping, such as: Pooping less than 3 times a week. Straining to poop. Having poop that is hard, dry, or larger than normal. Feeling full or bloated. Pain in the lower belly. Not feeling better after pooping. Get help right away if: You pass out (faint). You have chest pain. You have trouble breathing that: Is very bad. Gets worse with physical activity. You have a fast heartbeat, or a heartbeat that does not feel regular. You get light-headed when getting up from sitting or lying down. These symptoms may be an emergency. Do not wait to see if the symptoms will go away. Get medical help right away. Call your local emergency services (911 in the [place] Do not  drive yourself to the hospital. Summary Iron deficiency anemia is when you have too little iron in your body. This condition is treated by finding out why you do not have enough iron in your body and then getting more iron. Take over-the-counter and prescription medicines only as told by your doctor. Eat fresh fruits and vegetables that are high in vitamin C. Get help right away if you cannot breathe well. This information is not intended to replace advice given to you by your health care provider. Make sure you discuss any questions you have with your healthcare provider. Document Revised: 06/01/2019 Document Reviewed: 06/01/2019 Elsevier Patient Education  [place].

## 2021-04-24 NOTE — Progress Notes (Signed)
Chief Complaint  Patient presents with   Annual Exam   Annual  1. 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 Low back pain 7/10 see Dr. Jonni Sanger at [facility] chiropractor 4x  3. B12 def given B12 shot today and will do at home   Review of Systems  Constitutional:  Negative for weight loss.  HENT:  Negative for hearing loss.   Eyes:  Negative for blurred vision.  Respiratory:  Negative for shortness of breath.   Cardiovascular:  Negative for chest pain.  Gastrointestinal:  Negative for abdominal pain.  Musculoskeletal:  Positive for back pain.  Skin:  Negative for rash.  Neurological:  Negative for headaches.  Psychiatric/Behavioral:  The patient is nervous/anxious.   Past Medical History:  Diagnosis Date   Allergy    Anxiety    Bone tumor (benign)    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    BRCA negative 03/2016   MyRisk neg except NBN VUS   Chicken pox    Complication of anesthesia    COVID-19    12/2019   Depression during pregnancy 08/29/2020   Family history of breast cancer 03/2016   IBIS=18.4%  She has been tested for at least BRCA and was negative 12/29/2015). Tyrer-Cuzick (lifetime) was ~18%.    PONV (postoperative nausea and vomiting)    Pyelonephritis    S/P skin biopsy    neck normal    UTI (urinary tract infection)    Past Surgical History:  Procedure Laterality Date   BONE CYST EXCISION      right foot.   Family History  Problem Relation Age of Onset   Cancer Mother 26       breast / died at 69 in 28-Dec-2009   Hypertension Mother    Depression Mother    Arthritis Father    Depression Father        committed suicide in 12-28-14   Hypertension Father    Hypertension Sister    Heart disease Sister        heart murmur   Birth defects Sister    Depression Sister    Arthritis Maternal Grandmother    Other Maternal Grandmother        elevated tau protein; had corticobasal degneration    Cancer Paternal Grandmother        lung   Emphysema Paternal Grandfather    Social History   Socioeconomic History   Marital status: Single    Spouse name: Not on file   Number of children: Not on file   Years of education: Not on file   Highest education level: Not on file  Occupational History   Not on file  Tobacco Use   Smoking status: Never   Smokeless tobacco: Never  Vaping Use   Vaping Use: Never used  Substance and Sexual  Activity   Alcohol use: Yes    Comment: social   Drug use: No   Sexual activity: Not Currently    Partners: Male    Birth control/protection: None  Other Topics Concern   Not on file  Social History Narrative   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 Please cut pills in 1/2   citalopram (CELEXA) 10 MG tablet Take 1 tablet (10 mg total) by mouth daily.   cyanocobalamin (,VITAMIN B-12,) 1000 MCG/ML injection Inject 1 mL (1,000 mcg total)  into the muscle every 30 (thirty) days.   Syringe/Needle, Disp, 25G X 1-1/2" 5 ML MISC 1 Device by Does not apply route every 30 (thirty) days.   [DISCONTINUED] EPINEPHrine 0.3 mg/0.3 mL IJ SOAJ injection Inject into the muscle once.   Allergies  Allergen Reactions   Cefadroxil Nausea And Vomiting   Fire Ant     anaphylaxis   Septra [Sulfamethoxazole-Trimethoprim] Nausea And Vomiting   Zoloft [Sertraline Hcl]     Abnormal dreams     Recent Results (from the past 2160 hour(s))  Hepatitis C antibody     Status: None   Collection Time: 04/20/21  7:34 AM  Result Value Ref Range   Hepatitis C Ab NON-REACTIVE NON-REACTIVE   SIGNAL TO CUT-OFF 0.01 <1.00    Comment: . HCV antibody was non-reactive. There is no laboratory  evidence of HCV infection. . In most cases, no further action is required. However, if recent HCV exposure is suspected, a test for HCV RNA (test code [phone]) is suggested. . For additional information please refer to http://education.[facility].com/faq/FAQ22v1 (This link is being provided for informational/ educational purposes only.) .   Vitamin B12     Status: Abnormal   Collection Time: 04/20/21  7:34 AM  Result Value Ref Range   Vitamin B-12 210 (L) 211 - 911 pg/mL  Vitamin D (25 hydroxy)     Status: None   Collection Time: 04/20/21  7:34 AM  Result Value Ref Range   VITD 40.61 30.00 - 100.00 ng/mL  Iron, TIBC and Ferritin Panel     Status: Abnormal   Collection Time: 04/20/21  7:34 AM  Result Value Ref Range   Iron 35 (L) 40 - 190 mcg/dL   TIBC 332 250 - 450 mcg/dL (calc)   %SAT 11 (L) 16 - 45 % (calc)   Ferritin 15 (L) 16 - 154 ng/mL  Urinalysis, Routine w reflex microscopic     Status: None   Collection Time: 04/20/21  7:34 AM  Result Value Ref Range   Color, Urine YELLOW YELLOW   APPearance CLEAR CLEAR   Specific Gravity, Urine 1.011 1.001 - 1.035   pH 6.5 5.0 - 8.0   Glucose, UA NEGATIVE NEGATIVE   Bilirubin Urine NEGATIVE NEGATIVE    Ketones, ur NEGATIVE NEGATIVE   Hgb urine dipstick NEGATIVE NEGATIVE  Protein, ur NEGATIVE NEGATIVE   Nitrite NEGATIVE NEGATIVE   Leukocytes,Ua NEGATIVE NEGATIVE  TSH     Status: None   Collection Time: 04/20/21  7:34 AM  Result Value Ref Range   TSH 3.10 0.35 - 5.50 uIU/mL  CBC with Differential/Platelet     Status: None   Collection Time: 04/20/21  7:34 AM  Result Value Ref Range   WBC 4.6 4.0 - 10.5 K/uL   RBC 4.33 3.87 - 5.11 Mil/uL   Hemoglobin 13.8 12.0 - 15.0 g/dL   HCT 40.7 36.0 - 46.0 %   MCV 93.9 78.0 - 100.0 fl   MCHC 33.8 30.0 - 36.0 g/dL   RDW 13.0 11.5 - 15.5 %   Platelets 249.0 150.0 - 400.0 K/uL   Neutrophils Relative % 50.1 43.0 - 77.0 %   Lymphocytes Relative 35.6 12.0 - 46.0 %   Monocytes Relative 10.6 3.0 - 12.0 %   Eosinophils Relative 3.0 0.0 - 5.0 %   Basophils Relative 0.7 0.0 - 3.0 %   Neutro Abs 2.3 1.4 - 7.7 K/uL   Lymphs Abs 1.6 0.7 - 4.0 K/uL   Monocytes Absolute 0.5 0.1 - 1.0 K/uL   Eosinophils Absolute 0.1 0.0 - 0.7 K/uL   Basophils Absolute 0.0 0.0 - 0.1 K/uL  Lipid panel     Status: Abnormal   Collection Time: 04/20/21  7:34 AM  Result Value Ref Range   Cholesterol 121 0 - 200 mg/dL    Comment: ATP III Classification       Desirable:  < 200 mg/dL               Borderline High:  200 - 239 mg/dL          High:  > = 240 mg/dL   Triglycerides 98.0 0.0 - 149.0 mg/dL    Comment: Normal:  <150 mg/dLBorderline High:  150 - 199 mg/dL   HDL 34.40 (L) >39.00 mg/dL   VLDL 19.6 0.0 - 40.0 mg/dL   LDL Cholesterol 67 0 - 99 mg/dL   Total CHOL/HDL Ratio 4     Comment:                Men          Women1/2 Average Risk     3.4          3.3Average Risk          5.0          4.42X Average Risk          9.6          7.13X Average Risk          15.0          11.0                       NonHDL 86.68     Comment: NOTE:  Non-HDL goal should be 30 mg/dL higher than patient's LDL goal (i.e. LDL goal of < 70 mg/dL, would have non-HDL goal of < 100 mg/dL)   Comprehensive metabolic panel     Status: None   Collection Time: 04/20/21  7:34 AM  Result Value Ref Range   Sodium 138 135 - 145 mEq/L   Potassium 4.1 3.5 - 5.1 mEq/L   Chloride 104 96 - 112 mEq/L   CO2 27 19 - 32 mEq/L   Glucose, Bld 83 70 - 99 mg/dL   BUN 10 6 - 23 mg/dL  Creatinine, Ser 0.72 0.40 - 1.20 mg/dL   Total Bilirubin 0.3 0.2 - 1.2 mg/dL   Alkaline Phosphatase 74 39 - 117 U/L   AST 15 0 - 37 U/L   ALT 10 0 - 35 U/L   Total Protein 6.7 6.0 - 8.3 g/dL   Albumin 4.6 3.5 - 5.2 g/dL   GFR 109.91 >60.00 mL/min    Comment: Calculated using the CKD-EPI Creatinine Equation (2021)   Calcium 9.6 8.4 - 10.5 mg/dL   Objective  Body mass index is 20.3 kg/m. Wt Readings from Last 3 Encounters:  04/24/21 114 lb (51.7 kg)  04/23/21 111 lb (50.3 kg)  01/01/21 119 lb (54 kg)   Temp Readings from Last 3 Encounters:  04/24/21 99.2 F (37.3 C) (Oral)  10/22/20 97.7 F (36.5 C) (Oral)  10/11/20 98.8 F (37.1 C) (Oral)   BP Readings from Last 3 Encounters:  04/24/21 122/64  04/23/21 (!) 100/56  01/01/21 118/74   Pulse Readings from Last 3 Encounters:  04/24/21 75  04/23/21 76  11/01/20 70    Physical Exam Vitals and nursing note reviewed.  Constitutional:      Appearance: Normal appearance. She is well-developed and well-groomed.  HENT:     Head: Normocephalic and atraumatic.  Eyes:     Conjunctiva/sclera: Conjunctivae normal.     Pupils: Pupils are equal, round, and reactive to light.  Cardiovascular:     Rate and Rhythm: Normal rate and regular rhythm.     Heart sounds: Normal heart sounds. No murmur heard. Pulmonary:     Effort: Pulmonary effort is normal.     Breath sounds: Normal breath sounds.  Abdominal:     Tenderness: There is no abdominal tenderness.  Skin:    General: Skin is warm and dry.  Neurological:     General: No focal deficit present.     Mental Status: She is alert and oriented to person, place, and time. Mental status is at baseline.      Gait: Gait normal.  Psychiatric:        Attention and Perception: Attention and perception normal.        Mood and Affect: Mood and affect normal.        Speech: Speech normal.        Behavior: Behavior normal. Behavior is cooperative.        Thought Content: Thought content normal.        Cognition and Memory: Cognition and memory normal.        Judgment: Judgment normal.    Assessment  Plan  Annual physical exam Flu shot had 07/2018 work, likely had 07/2019  Tdap pt thinks had 2014/2015 Had hpv vaccine age 63 y.o Hep B, MMR immune   rec D3 2000 Iu daily also rec mvt    Declines STD check, married    Labs had 10/29/19 Will need to do lipid in the future not fasting today  Pap 06/21/19 neg neg HPV Dr. Alba Destine f/u sch 12/2019 disc anxiety and pregnancy and possibly what meds she can use   Kyleena IUD due to be replaced 02/22/21 she has appt 12/2019 and ? If will make appt to get it removed  My Myriad and BRCA neg mom died 67 y.o died 30 y.o  Skin no current issues as of 10/29/19 rec healthy diet and exercise    Anaphylaxis, subsequent encounter - Plan: EPINEPHrine 0.3 mg/0.3 mL IJ SOAJ injection  B12 deficiency - Plan: Vitamin B12, cyanocobalamin (,VITAMIN B-12,)  1000 MCG/ML injection, Syringe/Needle, Disp, 25G X 1-1/2" 5 ML MISC, cyanocobalamin ((VITAMIN B-12)) injection 1,000 mcg  Iron deficiency - Plan: Iron, TIBC and Ferritin Panel  FH: breast cancer in first degree relative  Anxiety and depression - Plan: citalopram (CELEXA) 10 MG tablet, buPROPion (WELLBUTRIN) 37.5 may start in future given hard copies  Continue therapy   Provider: Dr. Olivia Mackie McLean-Scocuzza-Internal Medicine

## 2021-05-03 DIAGNOSIS — F32A Depression, unspecified: Secondary | ICD-10-CM | POA: Insufficient documentation

## 2021-05-10 ENCOUNTER — Ambulatory Visit: Payer: BC Managed Care – PPO

## 2021-07-22 ENCOUNTER — Encounter: Payer: Self-pay | Admitting: Internal Medicine

## 2021-07-26 ENCOUNTER — Other Ambulatory Visit: Payer: Self-pay

## 2021-07-26 ENCOUNTER — Encounter: Payer: Self-pay | Admitting: Internal Medicine

## 2021-07-26 ENCOUNTER — Ambulatory Visit: Payer: BC Managed Care – PPO | Admitting: Internal Medicine

## 2021-07-26 VITALS — BP 124/76 | HR 69 | Temp 98.4°F | Ht 62.84 in | Wt 115.4 lb

## 2021-07-26 DIAGNOSIS — F419 Anxiety disorder, unspecified: Secondary | ICD-10-CM | POA: Diagnosis not present

## 2021-07-26 DIAGNOSIS — R5383 Other fatigue: Secondary | ICD-10-CM

## 2021-07-26 DIAGNOSIS — R41840 Attention and concentration deficit: Secondary | ICD-10-CM

## 2021-07-26 DIAGNOSIS — D649 Anemia, unspecified: Secondary | ICD-10-CM

## 2021-07-26 DIAGNOSIS — F439 Reaction to severe stress, unspecified: Secondary | ICD-10-CM

## 2021-07-26 DIAGNOSIS — E349 Endocrine disorder, unspecified: Secondary | ICD-10-CM

## 2021-07-26 DIAGNOSIS — E519 Thiamine deficiency, unspecified: Secondary | ICD-10-CM

## 2021-07-26 DIAGNOSIS — R4586 Emotional lability: Secondary | ICD-10-CM

## 2021-07-26 DIAGNOSIS — G4700 Insomnia, unspecified: Secondary | ICD-10-CM | POA: Diagnosis not present

## 2021-07-26 DIAGNOSIS — Z23 Encounter for immunization: Secondary | ICD-10-CM | POA: Diagnosis not present

## 2021-07-26 DIAGNOSIS — F32A Depression, unspecified: Secondary | ICD-10-CM

## 2021-07-26 DIAGNOSIS — E538 Deficiency of other specified B group vitamins: Secondary | ICD-10-CM

## 2021-07-30 ENCOUNTER — Telehealth: Payer: Self-pay | Admitting: Internal Medicine

## 2021-07-30 DIAGNOSIS — R41840 Attention and concentration deficit: Secondary | ICD-10-CM | POA: Insufficient documentation

## 2021-07-30 DIAGNOSIS — G4700 Insomnia, unspecified: Secondary | ICD-10-CM | POA: Insufficient documentation

## 2021-07-30 NOTE — Telephone Encounter (Signed)
Dr. Glennon Mac,  This is mutual pt having increased anxiety/depression/insomnia/stress  She wanted hormone levels checked but I do not do this Do you have any suggestions for female hormones which could cause above if so I will add these labs on to what I will order from a PCP standpoint   Thank you

## 2021-07-30 NOTE — Progress Notes (Signed)
Chief Complaint  Patient presents with   Depression   Anxiety   Referral   F/u with husband Matt 1.worsening anxiety/depression/insomnia( waking up for 2 hours at night unable to sleep)/stress (worse 3/4th  the way during her 3rd pregnancy and she has 3 boy children)/feeling overwhelmed/c/w adhd as she can not complete tasks at home will start 1 task move to the next before completing this is the same at work which is affecting family life.  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  Husband is with her today as he is concerned he reports she cant leave dishes in the sink at night and he is really concerned given her family history of depression and dad committed suicide in 12/31/2014 and mom had depression She reports the same feelings this time of the year every year she even had IUD taken out thinking this was the cause She wants bloodwork and hormone testing will cc Dr. Glennon Mac ob/gyn to see what blood work he recommmends  She has called several psychiatrist/therapist and no appts until 10/2021 or after In this visit I have tried to call psych I.e [facility] and psych in [place] and not taking new pts or NO appts   Review of Systems  Respiratory:  Negative for shortness of breath.   Cardiovascular:  Negative for chest pain.  Psychiatric/Behavioral:  Positive for depression. Negative for suicidal ideas. The patient is nervous/anxious and has insomnia.   Past Medical History:  Diagnosis Date   Allergy    Anxiety    Bone tumor (benign)    tumor vs cyst removed btwn age [age] y.o.    BRCA negative 03/2016   MyRisk neg except NBN VUS    Chicken pox    Complication of anesthesia    COVID-19    12/2019   Depression during pregnancy 08/29/2020   Family history of breast cancer 03/2016   IBIS=18.4%  She has been tested for at least BRCA and was negative January 01, 2016). Tyrer-Cuzick (lifetime) was ~18%.    PONV (postoperative nausea and vomiting)    Pyelonephritis    S/P skin biopsy    neck normal    UTI (urinary tract infection)    Past Surgical History:  Procedure Laterality Date   BONE CYST EXCISION     right foot.   Family History  Problem Relation Age of Onset   Cancer Mother 87       breast / died at 86 in 12/31/09   Hypertension Mother    Depression Mother    Arthritis Father    Depression Father        committed suicide in 31-Dec-2014   Hypertension Father    Hypertension Sister    Heart disease Sister        heart murmur   Birth defects Sister    Depression Sister    Arthritis Maternal Grandmother    Other Maternal Grandmother        elevated tau protein; had corticobasal degneration    Cancer Paternal Grandmother        lung   Emphysema Paternal Grandfather    Social History  Socioeconomic History   Marital status: Single    Spouse name: Not on file   Number of children: Not on file   Years of education: Not on file   Highest education level: Not on file  Occupational History   Not on file  Tobacco Use   Smoking status: Never   Smokeless tobacco: Never  Vaping Use   Vaping Use: Never used  Substance and Sexual Activity   Alcohol use: Yes    Comment: social   Drug use: No   Sexual activity: Not Currently    Partners: Male    Birth control/protection: None  Other Topics Concern   Not on file  Social History Narrative   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   Syringe/Needle, Disp, 25G X 1-1/2" 5 ML MISC 1 Device by Does not apply route every 30 (thirty) days.   Allergies  Allergen Reactions   Cefadroxil Nausea And Vomiting   Fire Ant     anaphylaxis   Septra [Sulfamethoxazole-Trimethoprim] Nausea And Vomiting   Zoloft [Sertraline Hcl]     Abnormal dreams     No results found for this or any previous visit (from the past 2160 hour(s)). Objective  Body mass index is 20.55 kg/m. Wt Readings from Last 3 Encounters:  07/26/21 115 lb 6.4 oz (52.3 kg)  04/24/21 114 lb (51.7 kg)  04/23/21 111 lb (50.3 kg)   Temp Readings from Last 3 Encounters:  07/26/21 98.4 F (36.9 C) (Oral)  04/24/21 99.2 F (37.3 C) (Oral)  10/22/20 97.7 F (36.5 C) (Oral)   BP Readings from Last 3 Encounters:  07/26/21 124/76  04/24/21 122/64  04/23/21 (!) 100/56   Pulse Readings from Last 3 Encounters:  07/26/21 69  04/24/21 75  04/23/21 76    Physical Exam Vitals and nursing note reviewed.  Constitutional:      Appearance: Normal appearance. She is well-developed and well-groomed.  HENT:     Head: Normocephalic and atraumatic.  Eyes:     Conjunctiva/sclera: Conjunctivae normal.     Pupils: Pupils are equal, round, and reactive to light.  Cardiovascular:     Rate and Rhythm: Normal rate and regular rhythm.     Heart sounds: Normal heart sounds. No murmur heard. Pulmonary:     Effort: Pulmonary effort is normal.     Breath sounds: Normal breath sounds.  Skin:    General: Skin is warm and moist.  Neurological:     General: No focal deficit present.     Mental Status: She is alert and oriented to person, place, and time.  Mental status is at baseline.     Gait: Gait normal.  Psychiatric:        Attention and Perception: Attention and perception normal.        Mood and Affect: Mood and affect normal.  Speech: Speech normal.        Behavior: Behavior normal. Behavior is cooperative.        Thought Content: Thought content normal.        Cognition and Memory: Cognition and memory normal.        Judgment: Judgment normal.    Assessment  Plan  Anxiety and depression (FH depression dad suicide and mom depression died young due to cancer) Insomnia, unspecified type Attention and concentration deficit -needs re-evaluation for adhd Tried in the past w/o help zoloft, celexa, effexor 37.5-75 mg  Also needs therapy likely long term  Labs done 04/20/21 with B12 def on B12 pt wants labs rechecked esp thyroid and will disc with Dr. Prentice Docker ob/gyn any other labs I.e hormones which could affect above?   HM Labs had 04/20/21 Flu shot today Tdap 08/17/20 3rd pregnacy Had hpv vaccine age 37 y.o Hep B, MMR immune   rec D3 2000 Iu daily also rec mvt    Declines STD check, married husband Matt   Pap 06/21/19 neg neg HPV Dr. Alba Destine f/u sch 12/2019 disc anxiety and pregnancy and possibly what meds she can use   IUD as of 2021/2022 removed  My Myriad and BRCA neg mom died 90 y.o died 86 y.o  rec healthy diet and exercise    Provider: Dr. Olivia Mackie McLean-Scocuzza-Internal Medicine

## 2021-08-02 ENCOUNTER — Encounter: Payer: Self-pay | Admitting: Internal Medicine

## 2021-08-06 NOTE — Addendum Note (Signed)
Addended by: Orland Mustard on: 08/06/2021 10:26 PM   Modules accepted: Orders

## 2021-08-07 ENCOUNTER — Telehealth: Payer: Self-pay | Admitting: Internal Medicine

## 2021-08-07 NOTE — Telephone Encounter (Signed)
Lft vm for a rep to call me back regarding referral. Thank you!

## 2021-08-13 NOTE — Addendum Note (Signed)
Addended by: Orland Mustard on: 08/13/2021 02:04 AM   Modules accepted: Orders

## 2021-08-29 ENCOUNTER — Other Ambulatory Visit: Payer: Self-pay

## 2021-08-29 ENCOUNTER — Other Ambulatory Visit (INDEPENDENT_AMBULATORY_CARE_PROVIDER_SITE_OTHER): Payer: BC Managed Care – PPO

## 2021-08-29 DIAGNOSIS — R4586 Emotional lability: Secondary | ICD-10-CM

## 2021-08-29 DIAGNOSIS — R5383 Other fatigue: Secondary | ICD-10-CM | POA: Diagnosis not present

## 2021-08-29 DIAGNOSIS — E349 Endocrine disorder, unspecified: Secondary | ICD-10-CM

## 2021-08-29 DIAGNOSIS — D649 Anemia, unspecified: Secondary | ICD-10-CM

## 2021-08-29 DIAGNOSIS — E519 Thiamine deficiency, unspecified: Secondary | ICD-10-CM | POA: Diagnosis not present

## 2021-08-29 DIAGNOSIS — E538 Deficiency of other specified B group vitamins: Secondary | ICD-10-CM

## 2021-08-29 DIAGNOSIS — F439 Reaction to severe stress, unspecified: Secondary | ICD-10-CM

## 2021-08-29 LAB — COMPREHENSIVE METABOLIC PANEL
ALT: 10 U/L (ref 0–35)
AST: 13 U/L (ref 0–37)
Albumin: 4.7 g/dL (ref 3.5–5.2)
Alkaline Phosphatase: 47 U/L (ref 39–117)
BUN: 18 mg/dL (ref 6–23)
CO2: 26 mEq/L (ref 19–32)
Calcium: 9.3 mg/dL (ref 8.4–10.5)
Chloride: 105 mEq/L (ref 96–112)
Creatinine, Ser: 0.7 mg/dL (ref 0.40–1.20)
GFR: 113.41 mL/min (ref >60.00)
Glucose, Bld: 80 mg/dL (ref 70–99)
Potassium: 4.1 mEq/L (ref 3.5–5.1)
Sodium: 138 mEq/L (ref 135–145)
Total Bilirubin: 0.6 mg/dL (ref 0.2–1.2)
Total Protein: 6.9 g/dL (ref 6.0–8.3)

## 2021-08-29 LAB — TSH: TSH: 1.54 u[IU]/mL (ref 0.35–5.50)

## 2021-08-29 LAB — CBC WITH DIFFERENTIAL/PLATELET
Basophils Absolute: 0 10*3/uL (ref 0.0–0.1)
Basophils Relative: 0.9 % (ref 0.0–3.0)
Eosinophils Absolute: 0.1 10*3/uL (ref 0.0–0.7)
Eosinophils Relative: 2.2 % (ref 0.0–5.0)
HCT: 40.1 % (ref 36.0–46.0)
Hemoglobin: 13.4 g/dL (ref 12.0–15.0)
Lymphocytes Relative: 36.9 % (ref 12.0–46.0)
Lymphs Abs: 2 10*3/uL (ref 0.7–4.0)
MCHC: 33.4 g/dL (ref 30.0–36.0)
MCV: 95.2 fl (ref 78.0–100.0)
Monocytes Absolute: 0.4 10*3/uL (ref 0.1–1.0)
Monocytes Relative: 6.9 % (ref 3.0–12.0)
Neutro Abs: 2.8 10*3/uL (ref 1.4–7.7)
Neutrophils Relative %: 53.1 % (ref 43.0–77.0)
Platelets: 282 10*3/uL (ref 150.0–400.0)
RBC: 4.21 Mil/uL (ref 3.87–5.11)
RDW: 13.2 % (ref 11.5–15.5)
WBC: 5.3 10*3/uL (ref 4.0–10.5)

## 2021-08-29 LAB — IBC + FERRITIN
Ferritin: 13.7 ng/mL (ref 10.0–291.0)
Iron: 67 ug/dL (ref 42–145)
Saturation Ratios: 18.3 % — ABNORMAL LOW (ref 20.0–50.0)
TIBC: 365.4 ug/dL (ref 250.0–450.0)
Transferrin: 261 mg/dL (ref 212.0–360.0)

## 2021-08-29 LAB — FOLLICLE STIMULATING HORMONE: FSH: 4.5 m[IU]/mL

## 2021-08-29 LAB — VITAMIN B12: Vitamin B-12: 291 pg/mL (ref 211–911)

## 2021-08-29 LAB — T4, FREE: Free T4: 0.84 ng/dL (ref 0.60–1.60)

## 2021-08-31 LAB — ESTRADIOL: Estradiol: 119 pg/mL

## 2021-08-31 LAB — CORTISOL-AM, BLOOD: Cortisol - AM: 10.1 ug/dL

## 2021-09-03 LAB — VITAMIN B1: Vitamin B1 (Thiamine): 10 nmol/L (ref 8–30)

## 2021-10-11 ENCOUNTER — Ambulatory Visit (INDEPENDENT_AMBULATORY_CARE_PROVIDER_SITE_OTHER): Payer: Self-pay | Admitting: Psychiatry

## 2021-10-11 ENCOUNTER — Other Ambulatory Visit: Payer: Self-pay

## 2021-10-11 ENCOUNTER — Encounter: Payer: Self-pay | Admitting: Psychiatry

## 2021-10-11 VITALS — BP 113/73 | HR 82 | Temp 98.8°F | Wt 118.0 lb

## 2021-10-11 DIAGNOSIS — F411 Generalized anxiety disorder: Secondary | ICD-10-CM

## 2021-10-11 DIAGNOSIS — G4700 Insomnia, unspecified: Secondary | ICD-10-CM

## 2021-10-11 DIAGNOSIS — F4010 Social phobia, unspecified: Secondary | ICD-10-CM

## 2021-10-11 DIAGNOSIS — F4381 Prolonged grief disorder: Secondary | ICD-10-CM | POA: Insufficient documentation

## 2021-10-11 MED ORDER — CITALOPRAM HYDROBROMIDE 20 MG PO TABS: 20.0000 mg | ORAL_TABLET | Freq: Every day | ORAL | 1 refills | Status: DC

## 2021-10-11 NOTE — Patient Instructions (Signed)
Miguel Dibble -  phone - [phone]    Phone # 2 - [phone]

## 2021-10-11 NOTE — Progress Notes (Signed)
Psychiatric Initial Adult Assessment   Patient Identification: Chelsea Duncan MRN:  [id number] Date of Evaluation:  10/11/2021 Referral Source: Karlyn AgeeJacklynn Lewis MD Chief Complaint:   Chief Complaint   Establish Care; Anxiety; Insomnia    Visit Diagnosis:    ICD-10-CM   1. GAD (generalized anxiety disorder)  F41.1 citalopram (CELEXA) 20 MG tablet    2. 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 Insomnia, unspecified type  G47.00       History of Present Illness:  Chelsea Duncan is a 36 year old Caucasian female, married, currently unemployed, lives in [place] with her spouse, 3 children, has a history of anxiety, was evaluated in office today, presented to establish care, referred by her primary care provider.  Patient reports she has been struggling with mood symptoms since the past several years.  She believes it may have started when she was doing her masters and working at the same time.  She reports this may have been in December 01, 2015 when she had her first severe anxiety symptoms.  It was also very close to her dad's suicidal death in November 30, 2014.  She does not know what could have triggered it however reports she had multiple psychosocial stressors going on at that time, including having a newborn and a toddler, working at a new position, doing her masters degree, death of her mother in 11-30-09, suicidal death of her father in 11/30/14 and so on.  Patient describes her anxiety symptoms as mostly irritability, anger issues, having outbursts usually at her spouse.  Outside of her home she is able to cope and behave well.  Patient reports she has a lot of racing thoughts, easy distractibility, restlessness, nervousness, constantly worrying about things that needs to be done.  She calls herself a perfectionist.  Patient reports she always has to go with her to do list and she is constantly worried about whether she will get  everything done or not.  There has been times when she has not been able to sleep at night due to constantly worrying about what all she needs to do.  She reports has always been a perfectionist, a go getter, keeps going and going on that she can accomplish her goals.  However her anxiety symptoms does have an impact on her day-to-day activities.  She reports she was tried on a few medications like Celexa low dosage in Dec 01, 2015 which she took on and off and it helped to some extent.  Later on Wellbutrin was added to the Celexa however her anger outbursts got worse and she had to stop taking the Wellbutrin and later on the Celexa completely.  Patient reports she may have felt some better when she came off of those medications.  However she continued to struggle with her mood symptoms.    She has had all hormonal test and other lab tests completed like TSH.  Her vitamin B12 level was low and she is currently on replacement otherwise her lab work-up came back within normal limits.  She also tried Zoloft for a few weeks to months and it helped.  When she initially tried it right after her mother's death it did give her abnormal dreams however when she tried it again it helped however she did not stay on it since she was breast-feeding her child.  Patient reports sleep problems on a regular basis.  Patient reports her child is only around 46-year-old and  she continues to be up at night taking care of the child when he needs her.  Most of the time she gets only around 4 hours of sleep.  She is not on any sleep medications and she needs to be awake and alert if her child needs her help at night.  Patient denies any significant history of sexual, emotional trauma (other than the death of her parents). Patient denies any PTSD symptoms.  Patient continues to grieve the loss of her parents.  She reports there are times when she feels guilty about her dad's suicide.  Patient became tearful when she discussed this.  She  reports she continues to be sad especially during the holidays, her children's birthdays and so on, thinking how things would have been different if her parents are still alive.  She denies any suicidality, homicidality or perceptual disturbances.  Patient denies any hypomanic or manic symptoms.  Completed a mood disorder questionnaire and scored very low on the same.  Denies any substance abuse problems.           Associated Signs/Symptoms: Depression Symptoms:  depressed mood, insomnia, psychomotor agitation, fatigue, difficulty concentrating, anxiety, loss of energy/fatigue, disturbed sleep, (Hypo) Manic Symptoms:  Distractibility, Irritable Mood, Labiality of Mood, Anxiety Symptoms:  Excessive Worry, Social Anxiety, Psychotic Symptoms:   Denies PTSD Symptoms: Had a traumatic exposure:  as noted above  Past Psychiatric History: Has a history of anxiety.  Patient had neuropsychological testing done at [facility] behavioral medicine, Dr. Alan Ripper 03/29/2019 and 04/20/2019.  Patient had multiple testing done and was diagnosed with generalized anxiety disorder, social anxiety disorder, performance only.  ADHD was ruled out.  Previous Psychotropic Medications: Yes Zoloft, Celexa, Xanax  Substance Abuse History in the last 12 months:  No.  Consequences of Substance Abuse: Negative  Past Medical History:  Past Medical History:  Diagnosis Date   Allergy    Anxiety    Bone tumor (benign)    tumor vs cyst removed btwn age [age] y.o.    BRCA negative 03/2016   MyRisk neg except NBN VUS   Chicken pox    Complication of anesthesia    COVID-19    12/2019   Depression during pregnancy 08/29/2020   Family history of breast cancer 03/2016   IBIS=18.4%  She has been tested for at least BRCA and was negative 2015-12-13). Tyrer-Cuzick (lifetime) was ~18%.    PONV (postoperative nausea and vomiting)    Pyelonephritis    S/P skin biopsy    neck normal    UTI (urinary tract  infection)     Past Surgical History:  Procedure Laterality Date   BONE CYST EXCISION     right foot.    Family Psychiatric History: As noted below.  Family History:  Family History  Problem Relation Age of Onset   Cancer Mother 36       breast / died at 14 in 12-12-2009   Hypertension Mother    Depression Mother    Arthritis Father    Depression Father        committed suicide in 12/12/2014   Hypertension Father    Hypertension Sister    Heart disease Sister        heart murmur   Birth defects Sister    Depression Sister    Arthritis Maternal Grandmother    Other Maternal Grandmother        elevated tau protein; had corticobasal degneration    Cancer Paternal Grandmother  lung   Emphysema Paternal Grandfather     Social History:   Social History   Socioeconomic History   Marital status: Married    Spouse name: [email or web address]   Number of children: 3   Years of education: Not on file   Highest education level: Master's degree (e.g., MA, MS, MEng, MEd, MSW, MBA)  Occupational History   Not on file  Tobacco Use   Smoking status: Never   Smokeless tobacco: Never  Vaping Use   Vaping Use: Never used  Substance and Sexual Activity   Alcohol use: Yes    Comment: social   Drug use: No   Sexual activity: Yes    Partners: Male    Birth control/protection: None  Other Topics Concern   Not on file  Social History Narrative         Married 3 boys    Grew up in [place]    1 older sister 41 years older than her    Social Determinants of Health   Financial Resource Strain: Not on file  Food Insecurity: Not on file  Transportation Needs: Not on file  Physical Activity: Not on file  Stress: Not on file  Social Connections: Not on file    Additional Social History: Patient was born in [place].  She was raised by both parents.  Patient's mother passed away due to cancer in 2010-01-12, father committed suicide in 01-13-2015.  She has a sister.  She is married, lives  with her spouse and 3 children, all boys aged 46, 31 and 3 months.  Patient has a master's degree, used to work as an Environmental consultant principal previously and also held other positions with school system.  She has not worked since November 2022.  Is planning to go back to work.  Denies any legal problems.  Does report trauma from the suicidal death of her father as noted above.  Has good support system from her spouse.  Allergies:   Allergies  Allergen Reactions   Cefadroxil Nausea And Vomiting   Fire Ant     anaphylaxis   Septra [Sulfamethoxazole-Trimethoprim] Nausea And Vomiting    Metabolic Disorder Labs: No results found for: HGBA1C, MPG No results found for: PROLACTIN Lab Results  Component Value Date   CHOL 121 04/20/2021   TRIG 98.0 04/20/2021   HDL 34.40 (L) 04/20/2021   CHOLHDL 4 04/20/2021   VLDL 19.6 04/20/2021   LDLCALC 67 04/20/2021   LDLCALC 76 10/27/2018   Lab Results  Component Value Date   TSH 1.54 08/29/2021    Therapeutic Level Labs: No results found for: LITHIUM No results found for: CBMZ No results found for: VALPROATE  Current Medications: Current Outpatient Medications  Medication Sig Dispense Refill   ALPRAZolam (XANAX) 0.25 MG tablet Take 0.25 mg by mouth daily as needed for anxiety.     citalopram (CELEXA) 20 MG tablet Take 1 tablet (20 mg total) by mouth daily. 30 tablet 1   cyanocobalamin (,VITAMIN B-12,) 1000 MCG/ML injection Inject 1 mL (1,000 mcg total) into the muscle every 30 (thirty) days. 4 mL 3   Syringe/Needle, Disp, 25G X 1-1/2" 5 ML MISC 1 Device by Does not apply route every 30 (thirty) days. 4 each 3   No current facility-administered medications for this visit.    Musculoskeletal: Strength & Muscle Tone: within normal limits Gait & Station: normal Patient leans: N/A  Psychiatric Specialty Exam: Review of Systems  Psychiatric/Behavioral:  Positive for decreased concentration and sleep  disturbance. The patient is nervous/anxious.         Grief-prolonged  All other systems reviewed and are negative.  Blood pressure 113/73, pulse 82, temperature 98.8 F (37.1 C), temperature source Temporal, weight 118 lb (53.5 kg), not currently breastfeeding.Body mass index is 21.01 kg/m.  General Appearance: Casual  Eye Contact:  Fair  Speech:  Clear and Coherent  Volume:  Normal  Mood:  Anxious, grieving   Affect:  Congruent  Thought Process:  Goal Directed and Descriptions of Associations: Circumstantial  Orientation:  Full (Time, Place, and Person)  Thought Content:  Logical  Suicidal Thoughts:  No  Homicidal Thoughts:  No  Memory:  Immediate;   Fair Recent;   Fair Remote;   Fair  Judgement:  Fair  Insight:  Fair  Psychomotor Activity:  Restlessness  Concentration:  Concentration: Fair and Attention Span: Fair  Recall:  [employer] of Knowledge:Fair  Language: Fair  Akathisia:  No  Handed:  Right  AIMS (if indicated):  not done  Assets:  Communication Skills Desire for [place] Talents/Skills Transportation Vocational/Educational  ADL's:  Intact  Cognition: WNL  Sleep:  Poor   Screenings: AUDIT    Flowsheet Row Office Visit from 06/11/2017 in [facility]  Alcohol Use Disorder Identification Test Final Score (AUDIT) 0      GAD-7    Flowsheet Row Office Visit from 10/11/2021 in [place] Office Visit from 07/26/2021 in [place] Office Visit from 10/20/2018 in [facility] [place]  Total GAD-7 Score _0 PHQ2-9    [place] Visit from 10/11/2021 in [place] Visit from 07/26/2021 in [place] Visit from 04/24/2021 in [place] Office Visit from 10/29/2019 in [place] Office Visit from 06/10/2019 in [place]  PHQ-2 Total Score 2 5 0 0 0  PHQ-9 Total  Score 9 18 -- -- --      [place] Office Visit from 10/11/2021 in [place] No Risk       Assessment and Plan: Hailey Stormer is a 36 year old Caucasian female, married, currently unemployed, lives in [place] with her spouse, 3 children, has a history of anxiety, was evaluated in office today, presented to establish care, referred by her primary care provider.  Patient with possible prolonged grief reaction to the suicidal death of her father as well as death of her mother, as well as mood lability likely secondary to insomnia, will benefit from the following plan. The patient demonstrates the following risk factors for suicide: Chronic risk factors for suicide include: psychiatric disorder of anxiety and completed suicide in a family member. Acute risk factors for suicide include: loss (financial, interpersonal, professional). Protective factors for this patient include: positive social support, positive therapeutic relationship, responsibility to others (children, family), coping skills, hope for the future, and life satisfaction. Considering these factors, the overall suicide risk at this point appears to be low. Patient is appropriate for outpatient follow up.  Plan  GAD-unstable Increase Celexa to 20 mg p.o. daily Continue Xanax 0.25 mg as needed, she has been limiting use and rarely takes it. Refer for psychotherapy session-provided community resources, have provided information for Ms. Miguel Dibble.  Social anxiety disorder-mostly performance anxiety-unstable Will benefit from CBT. I have referred for the same.  Insomnia-unstable Likely due to lack of sleep hygiene,  has an 38-monthold who needs her care at home at night which does affect her sleep. Not interested in sleep medications. We will continue to monitor, work on sleep hygiene.  Persistent bereavement-unstable Referral for CBT, grief counseling.  I have reviewed  neuropsychological testing as noted above from [place], Dr. AAlan Ripper6/22/2020 and 04/20/2019.  Patient had multiple testing done and was diagnosed with generalized anxiety disorder, social anxiety disorder, performance only.  ADHD was ruled out.  I have reviewed labs-dated 08/29/2021-vitamin B12-291-currently on vitamin B12 replacement, TSH-within normal limits.  Follow-up in clinic in 4 weeks or sooner if needed.  This note was generated in part or whole with voice recognition software. Voice recognition is usually quite accurate but there are transcription errors that can and very often do occur. I apologize for any typographical errors that were not detected and corrected.     SUrsula Alert MD 1/6/20231:50 PM

## 2021-10-23 ENCOUNTER — Telehealth: Payer: Self-pay | Admitting: *Deleted

## 2021-10-23 ENCOUNTER — Encounter: Payer: Self-pay | Admitting: Internal Medicine

## 2021-10-23 NOTE — Telephone Encounter (Signed)
Please place future orders for lab appt.  

## 2021-10-23 NOTE — Telephone Encounter (Signed)
We did labs 04/2021 and 08/2021 I do know if there is a need for labs  What lab were you hoping to get I dont have any I would recommend at this time Do you have an requests I.e specific labs?

## 2021-10-24 NOTE — Telephone Encounter (Signed)
LMTCB to if patient wanted any labs drawn.

## 2021-10-25 ENCOUNTER — Other Ambulatory Visit: Payer: BLUE CROSS/BLUE SHIELD

## 2021-10-25 ENCOUNTER — Other Ambulatory Visit: Payer: Self-pay

## 2021-10-25 NOTE — Telephone Encounter (Signed)
Pt called and canceled upcoming OV with PCP 1/25. Pt stated since labs were not needed, she didn't think she needed the appt.

## 2021-10-25 NOTE — Telephone Encounter (Signed)
Pt checked in at 7:55 for labs. I went to speak with her at 9:13 & informed that we tried to reach her by phone & mychart about her appt. I handed pt the unread mychart message & she got up & storm out of the front door. As she was leaving I asked if she knew of anything she needed, she said "I don't know" & kept walking.  Lab appt will be cancelled for today.

## 2021-10-25 NOTE — Telephone Encounter (Signed)
Patient already has an appointment scheduled for 04/25/2022 at 8:30 am.

## 2021-10-25 NOTE — Telephone Encounter (Signed)
I am ok with this make sure has appt w/in 1 year of last comprehensive labs for am appt and can do labs then  Call if appt needed sooner

## 2021-10-31 ENCOUNTER — Ambulatory Visit: Payer: BC Managed Care – PPO | Admitting: Internal Medicine

## 2021-11-01 ENCOUNTER — Other Ambulatory Visit: Payer: Self-pay

## 2021-11-01 ENCOUNTER — Telehealth (INDEPENDENT_AMBULATORY_CARE_PROVIDER_SITE_OTHER): Payer: BC Managed Care – PPO | Admitting: Psychiatry

## 2021-11-01 ENCOUNTER — Encounter: Payer: Self-pay | Admitting: Psychiatry

## 2021-11-01 ENCOUNTER — Other Ambulatory Visit: Payer: Self-pay | Admitting: Internal Medicine

## 2021-11-01 DIAGNOSIS — F4010 Social phobia, unspecified: Secondary | ICD-10-CM

## 2021-11-01 DIAGNOSIS — G4700 Insomnia, unspecified: Secondary | ICD-10-CM

## 2021-11-01 DIAGNOSIS — F411 Generalized anxiety disorder: Secondary | ICD-10-CM

## 2021-11-01 DIAGNOSIS — F4381 Prolonged grief disorder: Secondary | ICD-10-CM

## 2021-11-01 NOTE — Progress Notes (Signed)
Virtual Visit via Video Note  I connected with Chelsea Duncan on 11/01/21 at 11:40 AM EST by a video enabled telemedicine application and verified that I am speaking with the correct person using two identifiers.  Location Provider Location : [facility] Patient Location : Car  Participants: Patient , Provider    I discussed the limitations of evaluation and management by telemedicine and the availability of in person appointments. The patient expressed understanding and agreed to proceed.   I discussed the assessment and treatment plan with the patient. The patient was provided an opportunity to ask questions and all were answered. The patient agreed with the plan and demonstrated an understanding of the instructions.   The patient was advised to call back or seek an in-person evaluation if the symptoms worsen or if the condition fails to improve as anticipated.   [facility] MD OP Progress Note  11/01/2021 12:04 PM Gerard Bonus  MRN:  [id number]  Chief Complaint:  Chief Complaint   Follow-up 36 year old female, with history of generalized anxiety disorder, social anxiety disorder, bereavement presents for follow-up for medication management.    HPI: Chelsea Duncan is a 36 year old Caucasian female, married, lives in [place] with her spouse, 3 children, has a history of generalized anxiety disorder, social anxiety disorder, complex bereavement disorder persistent, insomnia was evaluated by telemedicine today.  Patient today reports she is currently tolerating the Celexa higher dosage.  She reports her anxiety has improved and she feels calm compared to how she was before.  She reports initially when she took the Celexa she felt groggy when she took it in the morning.  Now she takes it around 11:30 AM and that has been working out fairly well.  Patient reports she is currently working on her sleep hygiene.  Her baby has started sleeping better at night and that has been helpful.  Patient denies any  suicidality, homicidality or perceptual disturbances.  Patient reports she has been actively trying to get established with a therapist.  She has reached out to a few therapist in the community.  She does have upcoming appointment with our therapist as well.   Patient denies any suicidality, homicidality or perceptual disturbances.  Denies any other concerns today.   Visit Diagnosis:    ICD-10-CM   1. GAD (generalized anxiety disorder)  F41.1     2. 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 Insomnia, unspecified type  G47.00       Past Psychiatric History: Reviewed past psychiatric history from progress note on 10/11/2021.  Past trials of Zoloft, Celexa, Xanax  Past Medical History:  Past Medical History:  Diagnosis Date   Allergy    Anxiety    Bone tumor (benign)    tumor vs cyst removed btwn age [age] y.o.    BRCA negative 03/2016   MyRisk neg except NBN VUS   Chicken pox    Complication of anesthesia    COVID-19    12/2019   Depression during pregnancy 08/29/2020   Family history of breast cancer 03/2016   IBIS=18.4%  She has been tested for at least BRCA and was negative (~2017). Tyrer-Cuzick (lifetime) was ~18%.    PONV (postoperative nausea and vomiting)    Pyelonephritis    S/P skin biopsy    neck normal    UTI (urinary tract infection)     Past Surgical History:  Procedure Laterality Date   BONE CYST EXCISION  right foot.    Family Psychiatric History: Reviewed family psychiatric history from progress note on 10/11/2021.  Family History:  Family History  Problem Relation Age of Onset   Cancer Mother 63       breast / died at 74 in 11-15-2009   Hypertension Mother    Depression Mother    Arthritis Father    Depression Father        committed suicide in November 15, 2014   Hypertension Father    Hypertension Sister    Heart disease Sister        heart murmur   Birth defects Sister    Depression Sister     Arthritis Maternal Grandmother    Other Maternal Grandmother        elevated tau protein; had corticobasal degneration    Cancer Paternal Grandmother        lung   Emphysema Paternal Grandfather     Social History: Reviewed social history from progress note on 10/11/2021. Social History   Socioeconomic History   Marital status: Married    Spouse name: matthew   Number of children: 3   Years of education: Not on file   Highest education level: Master's degree (e.g., MA, MS, MEng, MEd, MSW, MBA)  Occupational History   Not on file  Tobacco Use   Smoking status: Never   Smokeless tobacco: Never  Vaping Use   Vaping Use: Never used  Substance and Sexual Activity   Alcohol use: Yes    Comment: social   Drug use: No   Sexual activity: Yes    Partners: Male    Birth control/protection: None  Other Topics Concern   Not on file  Social History Narrative         Married 3 boys    Grew up in [place]    1 older sister 51 years older than her    Social Determinants of Health   Financial Resource Strain: Not on file  Food Insecurity: Not on file  Transportation Needs: Not on file  Physical Activity: Not on file  Stress: Not on file  Social Connections: Not on file    Allergies:  Allergies  Allergen Reactions   Cefadroxil Nausea And Vomiting   Fire Ant     anaphylaxis   Septra [Sulfamethoxazole-Trimethoprim] Nausea And Vomiting    Metabolic Disorder Labs: No results found for: HGBA1C, MPG No results found for: PROLACTIN Lab Results  Component Value Date   CHOL 121 04/20/2021   TRIG 98.0 04/20/2021   HDL 34.40 (L) 04/20/2021   CHOLHDL 4 04/20/2021   VLDL 19.6 04/20/2021   LDLCALC 67 04/20/2021   LDLCALC 76 10/27/2018   Lab Results  Component Value Date   TSH 1.54 08/29/2021   TSH 3.10 04/20/2021    Therapeutic Level Labs: No results found for: LITHIUM No results found for: VALPROATE No components found for:  CBMZ  Current Medications: Current  Outpatient Medications  Medication Sig Dispense Refill   ALPRAZolam (XANAX) 0.25 MG tablet Take 0.25 mg by mouth daily as needed for anxiety.     buPROPion (WELLBUTRIN) 75 MG tablet TAKE 1/2 TABLET BY MOUTH TWICE DAILY 90 tablet 1   citalopram (CELEXA) 20 MG tablet Take 1 tablet (20 mg total) by mouth daily. 30 tablet 1   cyanocobalamin (,VITAMIN B-12,) 1000 MCG/ML injection Inject 1 mL (1,000 mcg total) into the muscle every 30 (thirty) days. 4 mL 3   Syringe/Needle, Disp, 25G X 1-1/2" 5 ML MISC 1  Device by Does not apply route every 30 (thirty) days. 4 each 3   No current facility-administered medications for this visit.     Musculoskeletal: Strength & Muscle Tone:  UTA Gait & Station:  Seated Patient leans: N/A  Psychiatric Specialty Exam: Review of Systems  Psychiatric/Behavioral:  Positive for sleep disturbance. The patient is nervous/anxious.   All other systems reviewed and are negative.  not currently breastfeeding.There is no height or weight on file to calculate BMI.  General Appearance: Casual  Eye Contact:  Fair  Speech:  Clear and Coherent  Volume:  Normal  Mood:  Anxious improving  Affect:  Congruent  Thought Process:  Goal Directed and Descriptions of Associations: Intact  Orientation:  Full (Time, Place, and Person)  Thought Content: Logical   Suicidal Thoughts:  No  Homicidal Thoughts:  No  Memory:  Immediate;   Fair Recent;   Fair Remote;   Fair  Judgement:  Fair  Insight:  Fair  Psychomotor Activity:  Normal  Concentration:  Concentration: Fair and Attention Span: Fair  Recall:  [employer] of Knowledge: Fair  Language: Fair  Akathisia:  No  Handed:  Right  AIMS (if indicated): not done  Assets:  Communication Skills Desire for [place] Talents/Skills Transportation Vocational/Educational  ADL's:  Intact  Cognition: WNL  Sleep:   improving   Screenings: AUDIT    Flowsheet Row [facility] Visit from 06/11/2017  in [facility]  Alcohol Use Disorder Identification Test Final Score (AUDIT) 0      GAD-7    Flowsheet Row Video Visit from 11/01/2021 in [facility] Office Visit from 10/11/2021 in [place] Office Visit from 07/26/2021 in [place] Office Visit from 10/20/2018 in [facility]  Total GAD-7 Score _0 PHQ2-9    [place] Video Visit from 11/01/2021 in [place] [facility] Office Visit from 10/11/2021 in [facility] Visit from 07/26/2021 in [facility] from 04/24/2021 in [facility] Office Visit from 10/29/2019 in [place]  PHQ-2 Total Score 0 2 5 0 0  PHQ-9 Total Score -- 9 18 -- --      [place] Office Visit from 10/11/2021 in [facility] No Risk        Assessment and Plan: Jaquana Geiger is a 36 year old Caucasian female, married, lives in [place] with her spouse, 3 children, has a history of anxiety was evaluated by telemedicine today.  Patient currently on a higher dosage of Celexa, tolerating it well, will benefit from following plan.  Plan  GAD-improving Celexa 20 mg p.o. daily Xanax 0.25 mg as needed as she has been limiting use. Patient has upcoming appointment with her therapist [name]  Social anxiety disorder-unstable She will benefit from CBT  Insomnia-improving Continue sleep hygiene techniques.  She does have an 11-monthold baby who may need her care at night.  Persistent bereavement-unstable Referred for CBT  Follow-up in clinic in 2 months or sooner if needed.  This note was generated in part or whole with voice recognition software. Voice recognition is usually quite accurate but there are transcription errors that can and very often do occur.  I apologize for any typographical errors that were not detected and corrected.     SUrsula Alert MD 11/02/2021, 8:21 AM

## 2021-11-09 ENCOUNTER — Other Ambulatory Visit: Payer: Self-pay

## 2021-11-09 ENCOUNTER — Ambulatory Visit (INDEPENDENT_AMBULATORY_CARE_PROVIDER_SITE_OTHER): Payer: BC Managed Care – PPO | Admitting: Licensed Clinical Social Worker

## 2021-11-09 DIAGNOSIS — F411 Generalized anxiety disorder: Secondary | ICD-10-CM

## 2021-11-09 DIAGNOSIS — F4381 Prolonged grief disorder: Secondary | ICD-10-CM

## 2021-11-09 NOTE — Plan of Care (Signed)
Developed tx plan w/ pt input

## 2021-11-12 NOTE — Progress Notes (Signed)
Virtual Visit via Video Note  I connected with Chelsea Duncan on 11/09/21 at  9:00 AM EST by a video enabled telemedicine application and verified that I am speaking with the correct person using two identifiers.  Location: Patient: home Provider: remote office [place], [place])   I discussed the limitations of evaluation and management by telemedicine and the availability of in person appointments. The patient expressed understanding and agreed to proceed.  I discussed the assessment and treatment plan with the patient. The patient was provided an opportunity to ask questions and all were answered. The patient agreed with the plan and demonstrated an understanding of the instructions.   The patient was advised to call back or seek an in-person evaluation if the symptoms worsen or if the condition fails to improve as anticipated.  I provided 60 minutes of non-face-to-face time during this encounter.   Chelsea Bo Alika Eppes, LCSW   Comprehensive Clinical Assessment (CCA) Note  11/12/2021 Chelsea Duncan [id number]  Chief Complaint:  Chief Complaint  Patient presents with   Establish Care   Visit Diagnosis:  GAD Bereavement    CCA Screening, Triage and Referral (STR)  Patient Reported Information How did you hear about [place]? Self  Referral name: Chelsea Duncan is a 36 yo presenting to [facility] virtually for establishment of counseling services. Pt reports that she has had counseling in the past and has found services helpful. Pt is currently a patient of Dr. Shea Duncan and is taking Celexa to manage depression and anxiety. 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 Pt denies any SI, HI, or AVH. Pt states she had some SI one year ago. Pt has history of suicide--father committed suicide.  Pt denies any paranoid ideation or nightmares/flashbacks. Pt states some of her goals of therapy are focusing on expectations, rigidity, overcompensation,  quality of life, guilt over self care.  Referral phone number: No data recorded  Whom do you see for routine medical problems? No data recorded Practice/Facility Name: No data recorded Practice/Facility Phone Number: No data recorded Name of Contact: No data recorded Contact Number: No data recorded Contact Fax Number: No data recorded Prescriber Name: No data recorded Prescriber Address (if known): No data recorded  What Is the Reason for Your Visit/Call Today? establish counseling services  How Long Has This Been Causing You Problems? > than 6 months  What Do You Feel Would Help You the Most Today? Treatment for Depression or other mood problem   Have You Recently Been in Any Inpatient Treatment (Hospital/Detox/Crisis Center/28-Day Program)? No  Name/Location of Program/Hospital:No data recorded How Long Were You There? No data recorded When Were You Discharged? No data recorded  Have You Ever Received Services From [facility] Before? Yes  Who Do You See at [facility]? No data recorded  Have You Recently Had Any Thoughts About Hurting Yourself? No  Are You Planning to Commit Suicide/Harm Yourself At This time? No   Have you Recently Had Thoughts About [place]? No data recorded Explanation: No data recorded  Have You Used Any Alcohol or Drugs in the Past 24 Hours? No  How Long Ago Did You Use Drugs or Alcohol? No data recorded What Did You Use and How Much? No data recorded  Do You Currently Have a Therapist/Psychiatrist? Yes  Name of Therapist/Psychiatrist: Dr. Shea Duncan   Have You Been Recently Discharged From Any Office Practice or Programs? No data recorded Explanation of Discharge From  Practice/Program: No data recorded    CCA Screening Triage Referral Assessment Type of Contact: Tele-Assessment  Is this Initial or Reassessment? Initial Assessment  Date Telepsych consult ordered in [facility]:  No data recorded Time Telepsych consult ordered in  [facility]:  No data recorded  Patient Reported Information Reviewed? No data recorded Patient Left Without Being Seen? No data recorded Reason for Not Completing Assessment: No data recorded  Collateral Involvement: n/a   Does Patient Have a Court Appointed Legal Guardian? No data recorded Name and Contact of Legal Guardian: No data recorded If Minor and Not Living with Parent(s), Who has Custody? No data recorded Is CPS involved or ever been involved? Never  Is APS involved or ever been involved? Never   Patient Determined To Be At Risk for Harm To Self or Others Based on Review of Patient Reported Information or Presenting Complaint? No  Method: No data recorded Availability of Means: No data recorded Intent: No data recorded Notification Required: No data recorded Additional Information for Danger to Others Potential: No data recorded Additional Comments for Danger to Others Potential: No data recorded Are There Guns or Other Weapons in Your Home? No data recorded Types of Guns/Weapons: No data recorded Are These Weapons Safely Secured?                            No data recorded Who Could Verify You Are Able To Have These Secured: No data recorded Do You Have any Outstanding Charges, Pending Court Dates, Parole/Probation? No data recorded Contacted To Inform of Risk of Harm To Self or Others: No data recorded  Location of Assessment: No data recorded  Does Patient Present under Involuntary Commitment? No  IVC Papers Initial File Date: No data recorded  [place] of Residence: [place]   Patient Currently Receiving the Following Services: Medication Management   Determination of Need: No data recorded  Options For Referral: Medication Management; Outpatient Therapy     CCA Biopsychosocial Intake/Chief Complaint:  depression, anxiety  Current Symptoms/Problems: anxiety   Patient Reported Schizophrenia/Schizoaffective Diagnosis in Past: No   Strengths: good levels  of self awareness  Preferences: outpatient psychiatric supports  Abilities: multitasking; pt sets goals; makes task lists daily   Type of Services Patient Feels are Needed: medication management; counseling services   Initial Clinical Notes/Concerns: anxiety; depression; bereavement   Mental Health Symptoms Depression:   Sleep (too much or little); Change in energy/activity; Difficulty Concentrating; Fatigue; Irritability (insomnia)   Duration of Depressive symptoms:  Greater than two weeks   Mania:   Racing thoughts; Irritability   Anxiety:    Irritability; Worrying; Fatigue; Difficulty concentrating; Restlessness   Psychosis:   None   Duration of Psychotic symptoms: No data recorded  Trauma:   Avoids reminders of event; Re-experience of traumatic event   Obsessions:   Recurrent & persistent thoughts/impulses/images   Compulsions:   None   Inattention:   Fails to pay attention/makes careless mistakes   Hyperactivity/Impulsivity:   None   Oppositional/Defiant Behaviors:   None   Emotional Irregularity:   None   Other Mood/Personality Symptoms:  No data recorded   Mental Status Exam Appearance and self-care  Stature:   Average   Weight:   Average weight   Clothing:   Neat/clean   Grooming:   Normal   Cosmetic use:   None   Posture/gait:   Normal   Motor activity:   Not Remarkable   Sensorium  Attention:  Normal   Concentration:   Normal   Orientation:   X5   Recall/memory:   Normal   Affect and Mood  Affect:   Anxious; Depressed; Constricted   Mood:   Anxious   Relating  Eye contact:   Normal   Facial expression:   Constricted; Anxious   Attitude toward examiner:   Cooperative   Thought and Language  Speech flow:  Clear and Coherent   Thought content:   Appropriate to Mood and Circumstances   Preoccupation:   Ruminations (order; precision)   Hallucinations:   None   Organization:  No data recorded   [employer] of Knowledge:   Good   Intelligence:   Above Average   Abstraction:   Normal   Judgement:   Good   Reality Testing:   Realistic   Insight:   Good   Decision Making:   Normal   Social Functioning  Social Maturity:   Responsible   Social Judgement:   Normal   Stress  Stressors:   Grief/losses   Coping Ability:   Programme researcher, broadcasting/film/video Deficits:   None   Supports:   Family     Religion:    Leisure/Recreation: Leisure / Recreation Do You Have Hobbies?: Yes Leisure and Hobbies: spending time with children  Exercise/Diet: Exercise/Diet Do You Exercise?: Yes Have You Gained or Lost A Significant Amount of Weight in the Past Six Months?: No Do You Follow a Special Diet?: No Do You Have Any Trouble Sleeping?: Yes Explanation of Sleeping Difficulties: occasional insomnia   CCA Employment/Education Employment/Work Situation: Employment / Work Situation Employment Situation: Unemployed (pt plans on going back to [employer]) Has Patient ever Been in Passenger transport manager?: No  Education: Education Is Patient Currently Attending School?: No Last Grade Completed: 12 Did Teacher, adult education From [employer]?: Yes Did Physicist, medical?: Yes What Type of College Degree Do you Have?: BA Did You Attend Graduate School?: Yes What is Your Post Graduate Degree?: Education/administration What Was Your Major?: Education Did You Have An Individualized Education Program (IIEP): No Did You Have Any Difficulty At School?: No Patient's Education Has Been Impacted by Current Illness: No   CCA Family/Childhood History Family and Relationship History: Family history Marital status: Married Does patient have children?: Yes How many children?: 3 How is patient's relationship with their children?: good relationship with children  Childhood History:  Childhood History By whom was/is the patient raised?: Both parents Additional childhood  history information: pt lost her mother to breast cancer--few years later lost father to suicide Description of patient's relationship with caregiver when they were a child: stable Patient's description of current relationship with people who raised him/her: parents deceased Does patient have siblings?: Yes Number of Siblings: 1 Description of patient's current relationship with siblings: sister Did patient suffer any verbal/emotional/physical/sexual abuse as a child?: No Did patient suffer from severe childhood neglect?: No Has patient ever been sexually abused/assaulted/raped as an adolescent or adult?: No Was the patient ever a victim of a crime or a disaster?: No Witnessed domestic violence?: No Has patient been affected by domestic violence as an adult?: No  Child/Adolescent Assessment:   N/a  CCA Substance Use Alcohol/Drug Use: Alcohol / Drug Use Pain Medications: SEE MAR Prescriptions: SEE MAR Over the Counter: SEE MAR History of alcohol / drug use?: No history of alcohol / drug abuse Substance #1 Name of Substance 1: ETOH 1 - Frequency: SOCIALLY 1- Route of Use: DRINK  ASAM's:  Six Dimensions of Multidimensional Assessment  Dimension 1:  Acute Intoxication and/or Withdrawal Potential:   Dimension 1:  Description of individual's past and current experiences of substance use and withdrawal: NONE  Dimension 2:  Biomedical Conditions and Complications:      Dimension 3:  Emotional, Behavioral, or Cognitive Conditions and Complications:     Dimension 4:  Readiness to Change:     Dimension 5:  Relapse, Continued use, or Continued Problem Potential:     Dimension 6:  Recovery/Living Environment:     ASAM Severity Score: ASAM's Severity Rating Score: 0  ASAM Recommended Level of Treatment:     Substance use Disorder (SUD)  none  Recommendations for Services/Supports/Treatments: Recommendations for Services/Supports/Treatments Recommendations For  Services/Supports/Treatments: Medication Management, Individual Therapy  DSM5 Diagnoses: Patient Active Problem List   Diagnosis Date Noted   Persistent complex bereavement disorder 10/11/2021   Insomnia 07/30/2021   Attention and concentration deficit 07/30/2021   Anxiety and depression 05/03/2021   Iron deficiency 04/24/2021   B12 deficiency 04/23/2021   Pregnancy 10/11/2020   Depression during pregnancy 08/29/2020   Annual physical exam 10/29/2019   Obsessive-compulsive disorder 05/11/2019   Social anxiety disorder 05/11/2019   GAD (generalized anxiety disorder) 05/11/2019   FH: breast cancer in first degree relative 07/01/2013    Patient Centered Plan: Patient is on the following Treatment Plan(s):  Anxiety   Referrals to Alternative Service(s): Referred to Alternative Service(s):   Place:   Date:   Time:    Referred to Alternative Service(s):   Place:   Date:   Time:    Referred to Alternative Service(s):   Place:   Date:   Time:    Referred to Alternative Service(s):   Place:   Date:   Time:     Chelsea Bo Nga Rabon, LCSW

## 2021-12-12 ENCOUNTER — Other Ambulatory Visit: Payer: Self-pay | Admitting: Psychiatry

## 2021-12-12 DIAGNOSIS — F4010 Social phobia, unspecified: Secondary | ICD-10-CM

## 2021-12-12 DIAGNOSIS — F411 Generalized anxiety disorder: Secondary | ICD-10-CM

## 2021-12-14 ENCOUNTER — Ambulatory Visit (INDEPENDENT_AMBULATORY_CARE_PROVIDER_SITE_OTHER): Payer: Self-pay | Admitting: Licensed Clinical Social Worker

## 2021-12-14 ENCOUNTER — Other Ambulatory Visit: Payer: Self-pay

## 2021-12-14 DIAGNOSIS — F411 Generalized anxiety disorder: Secondary | ICD-10-CM

## 2021-12-15 NOTE — Progress Notes (Signed)
Virtual Visit via Video Note ? ?I connected with Chelsea Duncan on 12/15/21 at 11:00 AM EST by a video enabled telemedicine application and verified that I am speaking with the correct person using two identifiers. ? ?Location: ?Patient: home/personal vehicle ?Provider: remote office [place], [place]) ?  ?I discussed the limitations of evaluation and management by telemedicine and the availability of in person appointments. The patient expressed understanding and agreed to proceed. ?  ?I discussed the assessment and treatment plan with the patient. The patient was provided an opportunity to ask questions and all were answered. The patient agreed with the plan and demonstrated an understanding of the instructions. ?  ?The patient was advised to call back or seek an in-person evaluation if the symptoms worsen or if the condition fails to improve as anticipated. ? ?I provided 55 minutes of non-face-to-face time during this encounter. ? ? ?Truth Wolaver R Sanya Kobrin, LCSW ? ?THERAPIST PROGRESS NOTE ? ?Session Time: 44-3154M ? ?Participation Level: Active ? ?Behavioral Response: Neat and Well GroomedAlertAnxious ? ?Type of Therapy: Individual Therapy ? ?Treatment Goals addressed: Problem: Reduce overall frequency, intensity, and duration of the anxiety so that daily functioning is not impaired per pt self report 3 out of 5 sessions documented.   ?Goal: LTG: Patient will score less than 5 on the Generalized Anxiety Disorder 7 Scale (GAD-7) ?Outcome: Progressing ?Goal: STG: Patient will participate in at least 80% of scheduled individual psychotherapy sessions ?Outcome: Progressing ? ?ProgressTowards Goals: Progressing ? ?Interventions: CBT ? ?Intervention: Encourage verbalization of feelings/concerns/expectations ?Intervention: Encourage patient to identify triggers ?Intervention: Monitor coping skills and behavior ? ?Summary: Chelsea Duncan is a 36 y.o. female who presents with improving symptoms related to anxiety diagnosis. Pt  reports that her mood has improved since last session and that she feels more in control and less anxious overall than last session. Pt reports that she is compliant with her medication and feels that they are managing her symptoms well.  ? ?Allowed pt to explore and express thoughts and feelings associated with recent life situations and external stressors. [name] explored several things along life timeline and linking psychological impact of past to present coping mechanisms. Discussed current coping strategies and discussed whether they are working well or need to be changed. Discussed expectations versus reality. Discussed differences in her own expectations and other family members expectations and how we cannot change others expectations, only our own. Pt very encouraged and engaged throughout session and was writing notes throughout.  ? ?Continued recommendations are as follows: self care behaviors, positive social engagements, focusing on overall work/home/life balance, and focusing on positive physical and emotional wellness.  ?.  ? ?Suicidal/Homicidal: No ? ?Therapist Response: Pt is continuing to apply interventions learned in session into daily life situations. Pt is currently on track to meet goals utilizing interventions mentioned above. Personal growth and progress noted. Treatment to continue as indicated.  ? ?Plan: Return again in 4 weeks. ? ?Diagnosis: GAD (generalized anxiety disorder) ? ?Collaboration of Care: Other pt encouraged to continue with psychiatrist of record, Dr. Shea Evans ? ?Patient/Guardian was advised Release of Information must be obtained prior to any record release in order to collaborate their care with an outside provider. Patient/Guardian was advised if they have not already done so to contact the registration department to sign all necessary forms in order for [place] to release information regarding their care.  ? ?Consent: Patient/Guardian gives verbal consent for treatment and  assignment of benefits for services provided during this visit. Patient/Guardian expressed understanding and  agreed to proceed.  ? ?Azrielle Springsteen R Antonio Creswell, LCSW ?12/15/2021 ? ?

## 2021-12-15 NOTE — Plan of Care (Signed)
?  Problem: Reduce overall frequency, intensity, and duration of the anxiety so that daily functioning is not impaired per pt self report 3 out of 5 sessions documented.   ?Goal: LTG: Patient will score less than 5 on the Generalized Anxiety Disorder 7 Scale (GAD-7) ?Outcome: Progressing ?Goal: STG: Patient will participate in at least 80% of scheduled individual psychotherapy sessions ?Outcome: Progressing ?Intervention: Encourage verbalization of feelings/concerns/expectations ?Intervention: Encourage patient to identify triggers ?Intervention: Monitor coping skills and behavior ?  ?

## 2021-12-31 ENCOUNTER — Other Ambulatory Visit: Payer: Self-pay

## 2021-12-31 ENCOUNTER — Encounter: Payer: Self-pay | Admitting: Psychiatry

## 2021-12-31 ENCOUNTER — Telehealth (INDEPENDENT_AMBULATORY_CARE_PROVIDER_SITE_OTHER): Payer: Self-pay | Admitting: Psychiatry

## 2021-12-31 DIAGNOSIS — G4701 Insomnia due to medical condition: Secondary | ICD-10-CM

## 2021-12-31 DIAGNOSIS — F4381 Prolonged grief disorder: Secondary | ICD-10-CM

## 2021-12-31 DIAGNOSIS — F411 Generalized anxiety disorder: Secondary | ICD-10-CM

## 2021-12-31 DIAGNOSIS — F4010 Social phobia, unspecified: Secondary | ICD-10-CM

## 2021-12-31 MED ORDER — CITALOPRAM HYDROBROMIDE 20 MG PO TABS: 20.0000 mg | ORAL_TABLET | Freq: Every day | ORAL | 1 refills | Status: DC

## 2021-12-31 MED ORDER — HYDROXYZINE PAMOATE 25 MG PO CAPS: 25.0000 mg | ORAL_CAPSULE | Freq: Every evening | ORAL | 1 refills | Status: DC | PRN

## 2021-12-31 NOTE — Progress Notes (Signed)
Virtual Visit via Video Note ? ?I connected with Chelsea Duncan on 12/31/21 at 10:00 AM EDT by a video enabled telemedicine application and verified that I am speaking with the correct person using two identifiers. ? ?Location ?Provider Location : [facility] ?Patient Location : Work ? ?Participants: Patient , Provider ?  ?I discussed the limitations of evaluation and management by telemedicine and the availability of in person appointments. The patient expressed understanding and agreed to proceed. ?  ?I discussed the assessment and treatment plan with the patient. The patient was provided an opportunity to ask questions and all were answered. The patient agreed with the plan and demonstrated an understanding of the instructions. ?  ?The patient was advised to call back or seek an in-person evaluation if the symptoms worsen or if the condition fails to improve as anticipated. ? ? ?[place] MD OP Progress Note ? ?12/31/2021 12:58 PM ?Chelsea Duncan  ?MRN:  [id number] ? ?Chief Complaint:  ?Chief Complaint  ?Patient presents with  ? Follow-up: 36 year old female with history of generalized anxiety disorder, social anxiety disorder, bereavement, presented for medication management.  ? ?HPI: Chelsea Duncan is a 36 year old Caucasian female, married, lives in [place] with her spouse, 3 children, has a history of GAD, social anxiety disorder, complex bereavement disorder persistent, insomnia was evaluated by telemedicine today. ? ?Patient today reports she is currently improving with regards to her mood.  Reports she continues to have anxiety in certain situations like if she has an upcoming event or an interview.  She reports she has racing thoughts at night about everything that she needs to do the next day when she has these kind of upcoming events.  Patient reports that does have an impact on her sleep.  She reports her child has been sleeping better and hence she is able to work on her sleep hygiene. ? ?Patient denies any suicidality,  homicidality or perceptual disturbances. ? ?Currently compliant on the Celexa.  Reports she takes it around 11 AM every day and that helps.  When she was taking it earlier it made her groggy. ? ?Has been following up with Margreta Journey, her therapist on a regular basis.  Motivated to stay in therapy. ? ?Denies any other concerns today. ? ?Visit Diagnosis:  ?  ICD-10-CM   ?1. GAD (generalized anxiety disorder)  F41.1 hydrOXYzine (VISTARIL) 25 MG capsule  ?  citalopram (CELEXA) 20 MG tablet  ?  ?2. Social anxiety disorder  F40.10 citalopram (CELEXA) 20 MG tablet  ?  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 mood  ?  ? ? ?Past Psychiatric History: Reviewed past psychiatric history from progress note on 10/11/2021.  Past trials of Zoloft, Celexa, Xanax ? ?Past Medical History:  ?Past Medical History:  ?Diagnosis Date  ? Allergy   ? Anxiety   ? Bone tumor (benign)   ? tumor vs cyst removed btwn age [age] y.o.   ? BRCA negative 03/2016  ? MyRisk neg except NBN VUS  ? Chicken pox   ? Complication of anesthesia   ? COVID-19   ? 12/2019  ? Depression during pregnancy 08/29/2020  ? Family history of breast cancer 03/2016  ? IBIS=18.4%  She has been tested for at least BRCA and was negative (~2017). Tyrer-Cuzick (lifetime) was ~18%.   ? PONV (postoperative nausea and vomiting)   ? Pyelonephritis   ? S/P skin biopsy   ? neck normal   ?  UTI (urinary tract infection)   ?  ?Past Surgical History:  ?Procedure Laterality Date  ? BONE CYST EXCISION    ? right foot.  ? ? ?Family Psychiatric History: Reviewed family psychiatric history from progress note on 10/11/2021. ? ?Family History:  ?Family History  ?Problem Relation Age of Onset  ? Cancer Mother 24  ?     breast / died at 49 in 2009-12-28  ? Hypertension Mother   ? Depression Mother   ? Arthritis Father   ? Depression Father   ?     committed suicide in 12-28-14  ? Hypertension Father   ? Hypertension Sister   ? Heart disease Sister    ?     heart murmur  ? Birth defects Sister   ? Depression Sister   ? Arthritis Maternal Grandmother   ? Other Maternal Grandmother   ?     elevated tau protein; had corticobasal degneration   ? Cancer Paternal Grandmother   ?     lung  ? Emphysema Paternal Grandfather   ? ? ?Social History: Reviewed social history from progress note on 10/11/2021. ?Social History  ? ?Socioeconomic History  ? Marital status: Married  ?  Spouse name: matthew  ? Number of children: 3  ? Years of education: Not on file  ? Highest education level: Master's degree (e.g., MA, MS, MEng, MEd, MSW, MBA)  ?Occupational History  ? Not on file  ?Tobacco Use  ? Smoking status: Never  ? Smokeless tobacco: Never  ?Vaping Use  ? Vaping Use: Never used  ?Substance and Sexual Activity  ? Alcohol use: Yes  ?  Comment: social  ? Drug use: No  ? Sexual activity: Yes  ?  Partners: Male  ?  Birth control/protection: None  ?Other Topics Concern  ? Not on file  ?Social History Narrative  ?   ?   ? Married 3 boys   ? Grew up in [place]   ? 1 older sister 57 years older than her   ? ?Social Determinants of Health  ? ?Financial Resource Strain: Not on file  ?Food Insecurity: Not on file  ?Transportation Needs: Not on file  ?Physical Activity: Not on file  ?Stress: Not on file  ?Social Connections: Not on file  ? ? ?Allergies:  ?Allergies  ?Allergen Reactions  ? Cefadroxil Nausea And Vomiting  ? Fire Dynegy   ?  anaphylaxis  ? Septra [Sulfamethoxazole-Trimethoprim] Nausea And Vomiting  ? ? ?Metabolic Disorder Labs: ?No results found for: HGBA1C, MPG ?No results found for: PROLACTIN ?Lab Results  ?Component Value Date  ? CHOL 121 04/20/2021  ? TRIG 98.0 04/20/2021  ? HDL 34.40 (L) 04/20/2021  ? CHOLHDL 4 04/20/2021  ? VLDL 19.6 04/20/2021  ? [place] 67 04/20/2021  ? [place] 76 10/27/2018  ? ?Lab Results  ?Component Value Date  ? TSH 1.54 08/29/2021  ? TSH 3.10 04/20/2021  ? ? ?Therapeutic Level Labs: ?No results found for: LITHIUM ?No results found for:  VALPROATE ?No components found for:  CBMZ ? ?Current Medications: ?Current Outpatient Medications  ?Medication Sig Dispense Refill  ? hydrOXYzine (VISTARIL) 25 MG capsule Take 1-2 capsules (25-50 mg total) by mouth at bedtime as needed. For sleep , anxiety 60 capsule 1  ? ALPRAZolam (XANAX) 0.25 MG tablet Take 0.25 mg by mouth daily as needed for anxiety.    ? citalopram (CELEXA) 20 MG tablet Take 1 tablet (20 mg total) by mouth daily. 90 tablet 1  ?  cyanocobalamin (,VITAMIN B-12,) 1000 MCG/ML injection Inject 1 mL (1,000 mcg total) into the muscle every 30 (thirty) days. 4 mL 3  ? Syringe/Needle, Disp, 25G X 1-1/2" 5 ML MISC 1 Device by Does not apply route every 30 (thirty) days. 4 each 3  ? ?No current facility-administered medications for this visit.  ? ? ? ?Musculoskeletal: ?Strength & Muscle Tone:  UTA ?Gait & Station:  Seated ?Patient leans: N/A ? ?Psychiatric Specialty Exam: ?Review of Systems  ?Psychiatric/Behavioral:  Positive for sleep disturbance.   ?All other systems reviewed and are negative.  ?not currently breastfeeding.There is no height or weight on file to calculate BMI.  ?General Appearance: Casual  ?Eye Contact:  Fair  ?Speech:  Clear and Coherent  ?Volume:  Normal  ?Mood:  Euthymic  ?Affect:  Congruent  ?Thought Process:  Goal Directed and Descriptions of Associations: Intact  ?Orientation:  Full (Time, Place, and Person)  ?Thought Content: Logical   ?Suicidal Thoughts:  No  ?Homicidal Thoughts:  No  ?Memory:  Immediate;   Fair ?Recent;   Fair ?Remote;   Fair  ?Judgement:  Fair  ?Insight:  Fair  ?Psychomotor Activity:  Normal  ?Concentration:  Concentration: Fair and Attention Span: Fair  ?Recall:  Fair  ?Fund of Knowledge: Fair  ?Language: Fair  ?Akathisia:  No  ?Handed:  Right  ?AIMS (if indicated): not done  ?Assets:  Communication Skills ?Desire for Improvement ?Housing ?Social Support  ?ADL's:  Intact  ?Cognition: WNL  ?Sleep:  Poor  ? ?Screenings: ?AUDIT   ? ?[place] [facility] Visit  from 06/11/2017 in [facility]  ?Alcohol Use Disorder Identification Test Final Score (AUDIT) 0  ? ?  ? ?GAD-7   ? ?Flowsheet Row Video Visit from 12/31/2021 in [facility]

## 2022-01-22 ENCOUNTER — Encounter: Payer: Self-pay | Admitting: Internal Medicine

## 2022-01-22 ENCOUNTER — Telehealth (INDEPENDENT_AMBULATORY_CARE_PROVIDER_SITE_OTHER): Payer: BC Managed Care – PPO | Admitting: Internal Medicine

## 2022-01-22 DIAGNOSIS — J0110 Acute frontal sinusitis, unspecified: Secondary | ICD-10-CM | POA: Diagnosis not present

## 2022-01-22 DIAGNOSIS — R051 Acute cough: Secondary | ICD-10-CM | POA: Diagnosis not present

## 2022-01-22 DIAGNOSIS — J029 Acute pharyngitis, unspecified: Secondary | ICD-10-CM

## 2022-01-22 MED ORDER — AZITHROMYCIN 250 MG PO TABS: ORAL_TABLET | ORAL | 0 refills | Status: AC

## 2022-01-22 NOTE — Progress Notes (Signed)
Virtual Visit via Video Note ? ?I connected with4:20 PM EDT by a video enabled telemedicine application and verified that I am speaking with the correct person using two identifiers. ? Location patient: [place] ?Location provider:work or home office ?Persons participating in the virtual visit: patient, provider ? ?I discussed the limitations and requested verbal permission for telemedicine visit. The patient expressed understanding and agreed to proceed. ? ? ?HPI: ? ?Acute telemedicine visit for : ?Sick visit c/o tonsil pain and sore throat, sinus pain and pressure, h/a, ears feel  tight, voice horse , PND since Thursday. Tried claritin, mucinex, tylenol cold  ?Her sone had cold 3 weeks ago and yesterday on Abx  ? ?-Pertinent past medical history: see below ?-Pertinent medication allergies: ?Allergies  ?Allergen Reactions  ? Cefadroxil Nausea And Vomiting  ? Fire Dynegy   ?  anaphylaxis  ? Septra [Sulfamethoxazole-Trimethoprim] Nausea And Vomiting  ? ?-COVID-19 vaccine status:  ?Immunization History  ?Administered Date(s) Administered  ? Influenza Split 07/01/2013, 07/07/2018  ? Influenza,inj,Quad PF,6+ Mos 10/17/2017, 09/12/2020, 07/26/2021  ? Tdap 10/07/2012, 08/17/2020  ? ? ? ?ROS: See pertinent positives and negatives per HPI. ? ?Past Medical History:  ?Diagnosis Date  ? Allergy   ? Anxiety   ? Bone tumor (benign)   ? tumor vs cyst removed btwn age [age] y.o.   ? BRCA negative 03/2016  ? MyRisk neg except NBN VUS  ? Chicken pox   ? Complication of anesthesia   ? COVID-19   ? 12/2019  ? Depression during pregnancy 08/29/2020  ? Family history of breast cancer 03/2016  ? IBIS=18.4%  She has been tested for at least BRCA and was negative (~2017). Tyrer-Cuzick (lifetime) was ~18%.   ? PONV (postoperative nausea and vomiting)   ? Pyelonephritis   ? S/P skin biopsy   ? neck normal   ? UTI (urinary tract infection)   ? ? ?Past Surgical History:  ?Procedure Laterality Date  ? BONE CYST EXCISION    ? right foot.  ? ? ? ?Current  Outpatient Medications:  ?  azithromycin (ZITHROMAX) 250 MG tablet, With food Take 2 tablets on day 1, then 1 tablet daily on days 2 through 5, Disp: 6 tablet, Rfl: 0 ?  citalopram (CELEXA) 20 MG tablet, Take 1 tablet (20 mg total) by mouth daily., Disp: 90 tablet, Rfl: 1 ?  cyanocobalamin (,VITAMIN B-12,) 1000 MCG/ML injection, Inject 1 mL (1,000 mcg total) into the muscle every 30 (thirty) days., Disp: 4 mL, Rfl: 3 ?  Syringe/Needle, Disp, 25G X 1-1/2" 5 ML MISC, 1 Device by Does not apply route every 30 (thirty) days., Disp: 4 each, Rfl: 3 ?  ALPRAZolam (XANAX) 0.25 MG tablet, Take 0.25 mg by mouth daily as needed for anxiety. (Patient not taking: Reported on 01/22/2022), Disp: , Rfl:  ?  hydrOXYzine (VISTARIL) 25 MG capsule, Take 1-2 capsules (25-50 mg total) by mouth at bedtime as needed. For sleep , anxiety (Patient not taking: Reported on 01/22/2022), Disp: 60 capsule, Rfl: 1 ? ?EXAM: ? ?VITALS per patient if applicable: ? ?GENERAL: alert, oriented, appears well and in no acute distress ? ?HEENT: atraumatic, conjunttiva clear, no obvious abnormalities on inspection of external nose and ears ?+hoarse voice  ? ?NECK: normal movements of the head and neck ? ?LUNGS: on inspection no signs of respiratory distress, breathing rate appears normal, no obvious gross SOB, gasping or wheezing ? ?CV: no obvious cyanosis ? ?MS: moves all visible extremities without noticeable abnormality ? ?PSYCH/NEURO: pleasant and  cooperative, no obvious depression or anxiety, speech and thought processing grossly intact ? ?ASSESSMENT AND PLAN: ? ?Discussed the following assessment and plan: ? ?Sore throat - Plan: COVID-19, Flu A+B and RSV, POCT rapid strep A, azithromycin (ZITHROMAX) 250 MG tablet ? ?Acute cough - Plan: COVID-19, Flu A+B and RSV, POCT rapid strep A ? ?Acute non-recurrent frontal sinusitis ?Consider prednisone in the future  ?Will come tomorrow 1:30 for testing  ?Supportive care  ? ?-we discussed possible serious and  likely etiologies, options for evaluation and workup, limitations of telemedicine visit vs in person visit, treatment, treatment risks and precautions. Pt is agreeable to treatment via telemedicine at this moment.  ?  ?I discussed the assessment and treatment plan with the patient. The patient was provided an opportunity to ask questions and all were answered. The patient agreed with the plan and demonstrated an understanding of the instructions. ?  ? ?Time spent 20 minutes ?Nino Glow McLean-Scocuzza, MD   ?

## 2022-01-22 NOTE — Patient Instructions (Signed)

## 2022-01-23 ENCOUNTER — Ambulatory Visit: Payer: Self-pay

## 2022-01-23 LAB — POCT RAPID STREP A (OFFICE): Rapid Strep A Screen: NEGATIVE

## 2022-01-25 LAB — COVID-19, FLU A+B AND RSV
Influenza A, NAA: NOT DETECTED
Influenza B, NAA: NOT DETECTED
RSV, NAA: NOT DETECTED
SARS-CoV-2, NAA: NOT DETECTED

## 2022-02-01 ENCOUNTER — Ambulatory Visit (INDEPENDENT_AMBULATORY_CARE_PROVIDER_SITE_OTHER): Payer: BC Managed Care – PPO | Admitting: Licensed Clinical Social Worker

## 2022-02-01 DIAGNOSIS — F411 Generalized anxiety disorder: Secondary | ICD-10-CM | POA: Diagnosis not present

## 2022-02-01 NOTE — Addendum Note (Signed)
Addended by: Orland Mustard on: 02/01/2022 02:50 PM ? ? Modules accepted: Orders ? ?

## 2022-02-01 NOTE — Plan of Care (Signed)
?  Problem: Reduce overall frequency, intensity, and duration of the anxiety so that daily functioning is not impaired per pt self report 3 out of 5 sessions documented.   ?Goal: LTG: Patient will score less than 5 on the Generalized Anxiety Disorder 7 Scale (GAD-7) ?Outcome: Progressing ?Goal: STG: Patient will participate in at least 80% of scheduled individual psychotherapy sessions ?Outcome: Progressing ?  ?

## 2022-02-01 NOTE — Progress Notes (Signed)
Virtual Visit via Video Note ? ?I connected with Chelsea Duncan on 02/01/22 at 10:00 AM EDT by a video enabled telemedicine application and verified that I am speaking with the correct person using two identifiers. ? ?Location: ?Patient: home/personal vehicle ?Provider: remote office [place], [place]) ?  ?I discussed the limitations of evaluation and management by telemedicine and the availability of in person appointments. The patient expressed understanding and agreed to proceed. ?  ?I discussed the assessment and treatment plan with the patient. The patient was provided an opportunity to ask questions and all were answered. The patient agreed with the plan and demonstrated an understanding of the instructions. ?  ?The patient was advised to call back or seek an in-person evaluation if the symptoms worsen or if the condition fails to improve as anticipated. ? ?I provided 35 minutes of non-face-to-face time during this encounter. ? ? ?Chelsea Duncan R Chelsea Mohammad, LCSW ? ?THERAPIST PROGRESS NOTE ? ?Session Time: 03-7048G ? ?Participation Level: Active ? ?Behavioral Response: Neat and Well GroomedAlertAnxious ? ?Type of Therapy: Individual Therapy ? ?Treatment Goals addressed: Problem: Reduce overall frequency, intensity, and duration of the anxiety so that daily functioning is not impaired per pt self report 3 out of 5 sessions documented.   ?Goal: LTG: Patient will score less than 5 on the Generalized Anxiety Disorder 7 Scale (GAD-7) ?Outcome: Progressing ? ?Goal: STG: Patient will participate in at least 80% of scheduled individual psychotherapy sessions ?Outcome: Progressing ? ?ProgressTowards Goals: Progressing ? ?Interventions: CBT ? ?Intervention: Encourage verbalization of feelings/concerns/expectations ? ?Intervention: Encourage patient to identify triggers ? ?Intervention: Monitor coping skills and behavior ? ?Summary: Chelsea Duncan is a 36 y.o. female who presents with improving symptoms related to anxiety  diagnosis. Pt reports that her mood has improved since last session and that she feels more in control and less anxious overall than last session. Pt reports that she is compliant with her medication and feels that they are managing her symptoms well.  ? ?Allowed pt to explore and express thoughts and feelings associated with recent life situations and external stressors. Patient reports that she has some stress over recently finding out that her infant has mono. Patient reports that it relieves stress a bit to know specifically what's going on with her baby because the baby has been sick for three weeks. Patient reports that she is happy about finding a job, and starts her job in may. 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 Discussed patients anxiety, and allow patient to identify triggers, and identify coping skills that are most helpful for managing her anxiety. Patient reports that she feels being in control is comforting for her, and uncertainty triggers anxiety. Encouraged patient to be in control, but have some flexibility to accommodate the unknown. Discussed how this would be distressing initially, but this is a time for patient to use her coping skills to manage that distress in the moment. Discussed patients relationship with husband--patient reports that she feels that he is trying to do too much, and is also stressed. Review the importance of self-care, and life balance. Patient reflects understanding and was given the opportunity to ask any questions or verbalize any concerns. ? ?Continued recommendations are as follows: self care behaviors, positive social engagements, focusing on overall work/home/life balance, and focusing on positive physical and emotional wellness.  ?.  ? ?Suicidal/Homicidal: No ? ?Therapist Response: Pt is continuing to apply interventions learned  in session into daily life situations. Pt is  currently on track to meet goals utilizing interventions mentioned above. Personal growth and progress noted. Treatment to continue as indicated.  ? ?Plan: Return again in 4 weeks. ? ?Diagnosis: GAD (generalized anxiety disorder) ? ?Collaboration of Care: Other pt encouraged to continue with psychiatrist of record, Dr. Shea Evans ? ?Patient/Guardian was advised Release of Information must be obtained prior to any record release in order to collaborate their care with an outside provider. Patient/Guardian was advised if they have not already done so to contact the registration department to sign all necessary forms in order for [place] to release information regarding their care.  ? ?Consent: Patient/Guardian gives verbal consent for treatment and assignment of benefits for services provided during this visit. Patient/Guardian expressed understanding and agreed to proceed.  ? ?Chelsea Duncan R Caiya Bettes, LCSW ?02/01/2022 ? ?

## 2022-02-04 LAB — MONONUCLEOSIS SCREEN: Mono Screen: NEGATIVE

## 2022-02-13 ENCOUNTER — Telehealth: Payer: Self-pay | Admitting: Family Medicine

## 2022-02-15 ENCOUNTER — Telehealth (INDEPENDENT_AMBULATORY_CARE_PROVIDER_SITE_OTHER): Payer: BC Managed Care – PPO | Admitting: Internal Medicine

## 2022-02-15 ENCOUNTER — Encounter: Payer: Self-pay | Admitting: Internal Medicine

## 2022-02-15 DIAGNOSIS — H1033 Unspecified acute conjunctivitis, bilateral: Secondary | ICD-10-CM | POA: Diagnosis not present

## 2022-02-15 DIAGNOSIS — H6690 Otitis media, unspecified, unspecified ear: Secondary | ICD-10-CM

## 2022-02-15 DIAGNOSIS — H10022 Other mucopurulent conjunctivitis, left eye: Secondary | ICD-10-CM

## 2022-02-15 DIAGNOSIS — B3731 Acute candidiasis of vulva and vagina: Secondary | ICD-10-CM

## 2022-02-15 MED ORDER — FLUCONAZOLE 150 MG PO TABS: 150.0000 mg | ORAL_TABLET | Freq: Once | ORAL | 0 refills | Status: AC

## 2022-02-15 MED ORDER — TOBRAMYCIN-DEXAMETHASONE 0.3-0.1 % OP SUSP: 1.0000 [drp] | Freq: Four times a day (QID) | OPHTHALMIC | 0 refills | Status: DC

## 2022-02-15 MED ORDER — TOBRAMYCIN-DEXAMETHASONE 0.3-0.1 % OP OINT: 1.0000 "application " | TOPICAL_OINTMENT | Freq: Three times a day (TID) | OPHTHALMIC | 0 refills | Status: DC

## 2022-02-15 MED ORDER — AMOXICILLIN-POT CLAVULANATE 875-125 MG PO TABS: 1.0000 | ORAL_TABLET | Freq: Two times a day (BID) | ORAL | 0 refills | Status: DC

## 2022-02-15 MED ORDER — ERYTHROMYCIN 5 MG/GM OP OINT: 1.0000 "application " | TOPICAL_OINTMENT | Freq: Three times a day (TID) | OPHTHALMIC | 0 refills | Status: DC

## 2022-02-15 NOTE — Progress Notes (Signed)
Virtual Visit via Video Note ? ?I connected with Chelsea Duncan ? on 02/15/22 at  7:40 AM EDT by a video enabled telemedicine application and verified that I am speaking with the correct person using two identifiers. ? Location patient:  ?Location provider:work or home office ?Persons participating in the virtual visit: patient, provider ? ?I discussed the limitations and requested verbal permission for telemedicine visit. The patient expressed understanding and agreed to proceed. ? ? ?HPI: ? ?Acute telemedicine visit for : ?1-2 weeks ago 36 yo had mono, pink eye, ear infection then w/in the last 1 week left eye lower inner lid red and watery itchy painful tried sons polymyxin eye drops w/o relief she called her eye doctor and they advised to use more freq which did not help and she also has gland swelling in front of ears L>r. Eyes were swollen and pink eye went from left eye to right  ? ? ?-Pertinent past medical history: see below ?-Pertinent medication allergies: ?Allergies  ?Allergen Reactions  ? Cefadroxil Nausea And Vomiting  ? Fire [employer]   ?  anaphylaxis  ? Septra [Sulfamethoxazole-Trimethoprim] Nausea And Vomiting  ? ?-COVID-19 vaccine status:  ?Immunization History  ?Administered Date(s) Administered  ? Influenza Split 07/01/2013, 07/07/2018  ? Influenza,inj,Quad PF,6+ Mos 10/17/2017, 09/12/2020, 07/26/2021  ? Tdap 10/07/2012, 08/17/2020  ? ? ? ?ROS: See pertinent positives and negatives per HPI. ? ?Past Medical History:  ?Diagnosis Date  ? Allergy   ? Anxiety   ? Bone tumor (benign)   ? tumor vs cyst removed btwn age [age] y.o.   ? BRCA negative 03/2016  ? MyRisk neg except NBN VUS  ? Chicken pox   ? Complication of anesthesia   ? COVID-19   ? 12/2019  ? Depression during pregnancy 08/29/2020  ? Family history of breast cancer 03/2016  ? IBIS=18.4%  She has been tested for at least BRCA and was negative (~2017). Tyrer-Cuzick (lifetime) was ~18%.   ? PONV (postoperative nausea and vomiting)   ? Pyelonephritis    ? S/P skin biopsy   ? neck normal   ? UTI (urinary tract infection)   ? ? ?Past Surgical History:  ?Procedure Laterality Date  ? BONE CYST EXCISION    ? right foot.  ? ? ? ?Current Outpatient Medications:  ?  ALPRAZolam (XANAX) 0.25 MG tablet, Take 0.25 mg by mouth daily as needed for anxiety., Disp: , Rfl:  ?  amoxicillin-clavulanate (AUGMENTIN) 875-125 MG tablet, Take 1 tablet by mouth 2 (two) times daily. With food, Disp: 14 tablet, Rfl: 0 ?  citalopram (CELEXA) 20 MG tablet, Take 1 tablet (20 mg total) by mouth daily., Disp: 90 tablet, Rfl: 1 ?  cyanocobalamin (,VITAMIN B-12,) 1000 MCG/ML injection, Inject 1 mL (1,000 mcg total) into the muscle every 30 (thirty) days., Disp: 4 mL, Rfl: 3 ?  fluconazole (DIFLUCAN) 150 MG tablet, Take 1 tablet (150 mg total) by mouth once for 1 dose. Repeat in 3 days, Disp: 2 tablet, Rfl: 0 ?  Syringe/Needle, Disp, 25G X 1-1/2" 5 ML MISC, 1 Device by Does not apply route every 30 (thirty) days., Disp: 4 each, Rfl: 3 ?  tobramycin-dexamethasone (TOBRADEX) ophthalmic ointment, Place 1 application. into both eyes 3 (three) times daily. X4-7 days, Disp: 3.5 g, Rfl: 0 ?  hydrOXYzine (VISTARIL) 25 MG capsule, Take 1-2 capsules (25-50 mg total) by mouth at bedtime as needed. For sleep , anxiety (Patient not taking: Reported on 01/22/2022), Disp: 60 capsule, Rfl: 1 ? ?EXAM: ? ?  VITALS per patient if applicable: ? ?GENERAL: alert, oriented, appears well and in no acute distress ? ?HEENT: atraumatic, conjunttiva clear, no obvious abnormalities on inspection of external nose and ears ?Inner lower lid red and left preauricular adenopathy +  ? ?NECK: normal movements of the head and neck ? ?LUNGS: on inspection no signs of respiratory distress, breathing rate appears normal, no obvious gross SOB, gasping or wheezing ? ?CV: no obvious cyanosis ? ?MS: moves all visible extremities without noticeable abnormality ? ?PSYCH/NEURO: pleasant and cooperative, no obvious depression or anxiety, speech and  thought processing grossly intact ? ?ASSESSMENT AND PLAN: ? ?Discussed the following assessment and plan: ? ?Pink eye disease of left eye - Plan: tobramycin-dexamethasone (TOBRADEX) ophthalmic ointment ? ?Acute otitis media, unspecified otitis media type - Plan: amoxicillin-clavulanate (AUGMENTIN) 875-125 MG tablet ? ?Yeast vaginitis - Plan: fluconazole (DIFLUCAN) 150 MG tablet ? ?HM  ?See last visit  ? ?-we discussed possible serious and likely etiologies, options for evaluation and workup, limitations of telemedicine visit vs in person visit, treatment, treatment risks and precautions. Pt is agreeable to treatment via telemedicine at this moment.  ?  ?I discussed the assessment and treatment plan with the patient. The patient was provided an opportunity to ask questions and all were answered. The patient agreed with the plan and demonstrated an understanding of the instructions. ?  ? ?Time spent 20 min ?Nino Glow McLean-Scocuzza, MD   ?

## 2022-02-15 NOTE — Addendum Note (Signed)
Addended by: Orland Mustard on: 02/15/2022 05:15 PM ? ? Modules accepted: Orders ? ?

## 2022-03-22 ENCOUNTER — Ambulatory Visit (INDEPENDENT_AMBULATORY_CARE_PROVIDER_SITE_OTHER): Payer: BC Managed Care – PPO | Admitting: Licensed Clinical Social Worker

## 2022-03-22 DIAGNOSIS — F411 Generalized anxiety disorder: Secondary | ICD-10-CM | POA: Diagnosis not present

## 2022-03-22 NOTE — Plan of Care (Signed)
  Problem: Reduce overall frequency, intensity, and duration of the anxiety so that daily functioning is not impaired per pt self report 3 out of 5 sessions documented.   Goal: LTG: Patient will score less than 5 on the Generalized Anxiety Disorder 7 Scale (GAD-7) Outcome: Progressing Goal: STG: Patient will participate in at least 80% of scheduled individual psychotherapy sessions Outcome: Progressing

## 2022-03-22 NOTE — Progress Notes (Signed)
Virtual Visit via Video Note  I connected with Ancil Boozer on 03/22/22 at 11:00 AM EDT by a video enabled telemedicine application and verified that I am speaking with the correct person using two identifiers.  Location: Patient: home Provider: remote office [place], [place])   I discussed the limitations of evaluation and management by telemedicine and the availability of in person appointments. The patient expressed understanding and agreed to proceed.   I discussed the assessment and treatment plan with the patient. The patient was provided an opportunity to ask questions and all were answered. The patient agreed with the plan and demonstrated an understanding of the instructions.   The patient was advised to call back or seek an in-person evaluation if the symptoms worsen or if the condition fails to improve as anticipated.  I provided 40 minutes of non-face-to-face time during this encounter.   [place], LCSW  THERAPIST PROGRESS NOTE  Session Time: 30-0923R  Participation Level: Active  Behavioral Response: Neat and Well GroomedAlertAnxious  Type of Therapy: Individual Therapy  Treatment Goals addressed: Problem: Reduce overall frequency, intensity, and duration of the anxiety so that daily functioning is not impaired per pt self report 3 out of 5 sessions documented.   Goal: LTG: Patient will score less than 5 on the Generalized Anxiety Disorder 7 Scale (GAD-7) Outcome: Progressing  Goal: STG: Patient will participate in at least 80% of scheduled individual psychotherapy sessions Outcome: Progressing  ProgressTowards Goals: Progressing  Interventions: CBT  Intervention: Encourage verbalization of feelings/concerns/expectations  Intervention: Encourage patient to identify triggers  Intervention: Monitor coping skills and behavior  Summary: Malyna Budney is a 36 y.o. female who presents with improving symptoms related to anxiety diagnosis.   Allowed pt  to explore and express thoughts and feelings associated with recent life situations and external stressors.Patient reporting thoughts and feelings associated with recent knowledge that she is pregnant. Patient feels that she is around six weeks pregnant. Patient feels anxious about this because she states that she has had some issues with her mental health in the past during pregnancy. Patient feels that she has better coping skills this time so feels that it will be a better experience than in the past. Patient feels like she has good support with her OBGYN, her psychiatrist, and counselor. Patient is working on managing her medications with both the psychiatrist and her OBGYN.  Patient reports that she recently started a new position, and feels that her stress levels have decreased significantly since starting the new position. Patient reports that her children are home from school and that the atmosphere is less stressful because they do not have the deadline of getting to school and picking up from school. Patient reports that summertime is a better time of year for her--patient spends more time outside which she feels is very beneficial. Patient reports that she has a babysitter that comes to help her manage the children while she works remotely.   Reviewed coping skills that have been helpful for patient in the past. Patient feels that she is doing a better job of trying to be more flexible versus rigid in her ways of doing things. Patient states that she is trying to live simply, which she feels is a blessing.   Continued recommendations are as follows: self care behaviors, positive social engagements, focusing on overall work/home/life balance, and focusing on positive physical and emotional wellness.  .   Suicidal/Homicidal: No  Therapist Response: Pt is continuing to apply interventions learned in session  into daily life situations. Pt is currently on track to meet goals utilizing interventions  mentioned above. Personal growth and progress noted. Treatment to continue as indicated.   Plan: Return again in 4 weeks.  Diagnosis: GAD (generalized anxiety disorder)  Collaboration of Care: Other pt encouraged to continue with psychiatrist of record, Dr. Shea Evans  Patient/Guardian was advised Release of Information must be obtained prior to any record release in order to collaborate their care with an outside provider. Patient/Guardian was advised if they have not already done so to contact the registration department to sign all necessary forms in order for [place] to release information regarding their care.   Consent: Patient/Guardian gives verbal consent for treatment and assignment of benefits for services provided during this visit. Patient/Guardian expressed understanding and agreed to proceed.   [place], LCSW 03/22/2022

## 2022-04-01 ENCOUNTER — Encounter: Payer: Self-pay | Admitting: Psychiatry

## 2022-04-01 ENCOUNTER — Telehealth (INDEPENDENT_AMBULATORY_CARE_PROVIDER_SITE_OTHER): Payer: BC Managed Care – PPO | Admitting: Psychiatry

## 2022-04-01 DIAGNOSIS — F4010 Social phobia, unspecified: Secondary | ICD-10-CM

## 2022-04-01 DIAGNOSIS — F411 Generalized anxiety disorder: Secondary | ICD-10-CM

## 2022-04-01 DIAGNOSIS — G4701 Insomnia due to medical condition: Secondary | ICD-10-CM | POA: Diagnosis not present

## 2022-04-01 DIAGNOSIS — F4381 Prolonged grief disorder: Secondary | ICD-10-CM | POA: Diagnosis not present

## 2022-04-01 MED ORDER — CITALOPRAM HYDROBROMIDE 10 MG PO TABS: 10.0000 mg | ORAL_TABLET | Freq: Every day | ORAL | 0 refills | Status: DC

## 2022-04-02 DIAGNOSIS — N912 Amenorrhea, unspecified: Secondary | ICD-10-CM | POA: Insufficient documentation

## 2022-04-25 ENCOUNTER — Encounter: Payer: Self-pay | Admitting: Internal Medicine

## 2022-05-01 LAB — OB RESULTS CONSOLE VARICELLA ZOSTER ANTIBODY, IGG: Varicella: IMMUNE

## 2022-05-01 LAB — OB RESULTS CONSOLE RUBELLA ANTIBODY, IGM: Rubella: IMMUNE

## 2022-05-01 LAB — OB RESULTS CONSOLE HEPATITIS B SURFACE ANTIGEN: Hepatitis B Surface Ag: NEGATIVE

## 2022-05-10 ENCOUNTER — Encounter: Payer: BC Managed Care – PPO | Admitting: Internal Medicine

## 2022-05-17 ENCOUNTER — Encounter: Payer: Self-pay | Admitting: Psychiatry

## 2022-05-17 ENCOUNTER — Telehealth (INDEPENDENT_AMBULATORY_CARE_PROVIDER_SITE_OTHER): Payer: BC Managed Care – PPO | Admitting: Psychiatry

## 2022-05-17 DIAGNOSIS — F4010 Social phobia, unspecified: Secondary | ICD-10-CM | POA: Diagnosis not present

## 2022-05-17 DIAGNOSIS — F4381 Prolonged grief disorder: Secondary | ICD-10-CM

## 2022-05-17 DIAGNOSIS — F411 Generalized anxiety disorder: Secondary | ICD-10-CM | POA: Diagnosis not present

## 2022-05-17 DIAGNOSIS — G4701 Insomnia due to medical condition: Secondary | ICD-10-CM

## 2022-05-17 NOTE — Progress Notes (Signed)
Virtual Visit via Video Note  I connected with Chelsea Duncan on 05/17/22 at 10:40 AM EDT by a video enabled telemedicine application and verified that I am speaking with the correct person using two identifiers.  Location Provider Location : [facility] Patient Location : Work  Participants: Patient , Provider   I discussed the limitations of evaluation and management by telemedicine and the availability of in person appointments. The patient expressed understanding and agreed to proceed.   I discussed the assessment and treatment plan with the patient. The patient was provided an opportunity to ask questions and all were answered. The patient agreed with the plan and demonstrated an understanding of the instructions.   The patient was advised to call back or seek an in-person evaluation if the symptoms worsen or if the condition fails to improve as anticipated.   [place] MD OP Progress Note  05/17/2022 2:10 PM Cyril Railey  MRN:  [id number]  Chief Complaint:  Chief Complaint  Patient presents with   Follow-up: 36 year old Caucasian female with history of anxiety, bereavement, insomnia, currently pregnant, presented for medication management.   HPI: Chelsea Duncan is a 36 year old Caucasian female, married, currently employed, lives in [place] with her spouse, has a history of GAD, social anxiety disorder, bereavement disorder, insomnia, currently pregnant, LMP 02/07/2022, EGA is 14W,  EDD of 11/11/2022 presented for medication management.  Patient today reports overall she has been doing fairly well with regards to her mood.  She does have anxiety about her pregnancy, delivery however she has been coping okay, it does not overwhelm her.  She denies any significant depression symptoms.  Has been able to take care of herself, her children, as well as her job.  She continues to work at [employer].  Managing her work okay.  Does have good support system from her family including her spouse.  Reports she is  currently taking the Celexa low dosage of 10 mg.  Denies side effects.  She reports she skipped the dose couple of times recently and could notice a significant change in her mood, irritability.  She hence has been more compliant.  Patient reports sleep is overall good.  Reports appetite as fair.  Currently does not have any significant nausea.  Does have some abdominal pain, reports it is more like a discomfort than anything else.  Reports she does have upcoming appointment and also will need an ultrasound at 20 weeks.  Plans to discuss with her OB/GYN.  Patient currently follows up with her OB/GYN-Dr. Glennon Mac, currently at [facility] clinic.  Patient denies any suicidality, homicidality or perceptual disturbances.  Patient is motivated to stay in psychotherapy, has upcoming appointment.  Denies any other concerns today.  Visit Diagnosis:    ICD-10-CM   1. GAD (generalized anxiety disorder)  F41.1     2. 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  Past trials of medications like Zoloft, Celexa, Xanax.  Past Medical History:  Past Medical History:  Diagnosis Date   Allergy    Anxiety    Bone tumor (benign)    tumor vs cyst removed btwn age [age] y.o.    BRCA negative 03/2016   MyRisk neg except NBN VUS   Chicken pox    Complication of anesthesia  COVID-19    12/2019   Depression during pregnancy 08/29/2020   Family history of breast cancer 03/2016   IBIS=18.4%  She has been tested for at least BRCA and was negative Dec 07, 2015). Tyrer-Cuzick (lifetime) was ~18%.    PONV (postoperative nausea and vomiting)    Pyelonephritis    S/P skin biopsy    neck normal    UTI (urinary tract infection)     Past Surgical History:  Procedure Laterality Date   BONE CYST EXCISION     right  foot.    Family Psychiatric History: Reviewed family psychiatric history from progress note on 10/11/2021.  Family History:  Family History  Problem Relation Age of Onset   Cancer Mother 59       breast / died at 64 in 12-06-09   Hypertension Mother    Depression Mother    Arthritis Father    Depression Father        committed suicide in 06-Dec-2014   Hypertension Father    Hypertension Sister    Heart disease Sister        heart murmur   Birth defects Sister    Depression Sister    Arthritis Maternal Grandmother    Other Maternal Grandmother        elevated tau protein; had corticobasal degneration    Cancer Paternal Grandmother        lung   Emphysema Paternal Grandfather     Social History: Reviewed social history from progress note on 10/11/2021. Social History   Socioeconomic History   Marital status: Married    Spouse name: matthew   Number of children: 3   Years of education: Not on file   Highest education level: Master's degree (e.g., MA, MS, MEng, MEd, MSW, MBA)  Occupational History   Not on file  Tobacco Use   Smoking status: Never   Smokeless tobacco: Never  Vaping Use   Vaping Use: Never used  Substance and Sexual Activity   Alcohol use: Yes    Comment: social   Drug use: No   Sexual activity: Yes    Partners: Male    Birth control/protection: None  Other Topics Concern   Not on file  Social History Narrative   Married 3 boys    Grew up in [place]    1 older sister 36 years older than her    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   Social Determinants of Radio broadcast assistant Strain: Not on file  Food Insecurity: Not on file  Transportation Needs: Not on file  Physical Activity: Not on file  Stress: Not on file  Social Connections: Not on file    Allergies:  Allergies  Allergen Reactions   Cefadroxil Nausea And Vomiting   Fire Ant Other (See Comments)    anaphylaxis anaphylaxis    Septra [Sulfamethoxazole-Trimethoprim] Nausea And Vomiting    Metabolic Disorder Labs: No results found for: "HGBA1C", "MPG" No results found for: "PROLACTIN" Lab Results  Component Value Date   CHOL 121 04/20/2021   TRIG 98.0 04/20/2021   HDL 34.40 (L) 04/20/2021   CHOLHDL 4 04/20/2021   VLDL 19.6 04/20/2021   LDLCALC 67 04/20/2021   LDLCALC 76 10/27/2018   Lab Results  Component Value Date   TSH 1.54 08/29/2021   TSH 3.10 04/20/2021    Therapeutic Level Labs: No results found for: "LITHIUM" No results found for: "VALPROATE" No results found for: "CBMZ"  Current Medications: Current Outpatient Medications  Medication Sig Dispense Refill   citalopram (CELEXA) 10 MG tablet Take 1 tablet (10 mg total) by mouth daily. 90 tablet 0   Prenatal Vit-Fe Fumarate-FA (PRENATAL MULTIVITAMIN) TABS tablet Take 1 tablet by mouth daily at 12 noon.     cyanocobalamin (VITAMIN B12) 1000 MCG/ML injection  (Patient not taking: Reported on 05/17/2022)     Syringe/Needle, Disp, 25G X 1-1/2" 5 ML MISC 1 Device by Does not apply route every 30 (thirty) days. (Patient not taking: Reported on 05/17/2022) 4 each 3   No current facility-administered medications for this visit.     Musculoskeletal: Strength & Muscle Tone:  UTA Gait & Station:  Seated Patient leans: N/A  Psychiatric Specialty Exam: Review of Systems  Gastrointestinal:        Is pregnant  Psychiatric/Behavioral: Negative.    All other systems reviewed and are negative.   not currently breastfeeding.There is no height or weight on file to calculate BMI.  General Appearance: Casual  Eye Contact:  Fair  Speech:  Clear and Coherent  Volume:  Normal  Mood:  Euthymic  Affect:  Congruent  Thought Process:  Goal Directed and Descriptions of Associations: Intact  Orientation:  Full (Time, Place, and Person)  Thought Content: Logical   Suicidal Thoughts:  No  Homicidal Thoughts:  No  Memory:  Immediate;   Fair Recent;    Fair Remote;   Fair  Judgement:  Fair  Insight:  Fair  Psychomotor Activity:  Normal  Concentration:  Concentration: Fair and Attention Span: Fair  Recall:  [employer] of Knowledge: Fair  Language: Fair  Akathisia:  No  Handed:  Right  AIMS (if indicated): done  Assets:  Communication Skills Desire for Improvement Social Support Talents/Skills Transportation Vocational/Educational  ADL's:  Intact  Cognition: WNL  Sleep:  Fair   Screenings: AIMS    Flowsheet Row Video Visit from 05/17/2022 in [place] Video Visit from 04/01/2022 in [place] Total Score 0 0      AUDIT    [place] Office Visit from [facility]/11/2017 in [facility]  Alcohol Use Disorder Identification Test Final Score (AUDIT) 0      GAD-7    Flowsheet Row Video Visit from 05/17/2022 in [facility] [place] Video Visit from 04/01/2022 in [place] Video Visit from 12/31/2021 in [facility] Counselor from 11/09/2021 in [place] Video Visit from 11/01/2021 in [facility] [place]  Total GAD-7 Score '3 1 7 14 8      ' PHQ2-9    Flowsheet Row Video Visit from 05/17/2022 in [place] Video Visit from 04/01/2022 in [place] Video Visit from 02/15/2022 in [facility] Video Visit from 01/22/2022 in [facility] Video Visit from 12/31/2021 in [facility]  PHQ-2 Total Score 0 0 0 0 0      Flowsheet Row Video Visit from 05/17/2022 in [place] Video Visit from 04/01/2022 in [facility] Counselor from 11/09/2021 in [facility] No Risk No Risk No Risk        Assessment and Plan: Avneet Ashmore is a 36 year old Caucasian female, married, lives in [place] with her spouse, has a history of GAD, social anxiety, bereavement, insomnia was evaluated by telemedicine today.  Patient is currently pregnant, LMP 02/07/2022, EGA is  14W,  EDD of 11/11/2022 presented for medication management.  Will benefit from the following plan.  Plan GAD-stable Celexa 10 mg p.o. daily-reduced dosage. Continue CBT with [name]   Patient anxiety disorder-improving Continue CBT  Insomnia-stable Continue sleep hygiene techniques. Discussed starting doxylamine 25 mg as needed.  Patient could use it if she has any sleep problems.  Persistent bereavement-in remission Continue CBT  Reviewed notes per OB/GYN-Dr. Rubin Payor 05/01/2022-patient has upcoming appointment.  Will coordinate care.  Follow-up in clinic in 2 months or sooner if needed.   Collaboration of Care: Collaboration of Care: Referral or follow-up with counselor/therapist AEB encouraged to continue therapy with her therapist as well as follow-up with OB/GYN.  Patient/Guardian was advised Release of Information must be obtained prior to any record release in order to collaborate their care with an outside provider. Patient/Guardian was advised if they have not already done so to contact the registration department to sign all necessary forms in order for [place] to release information regarding their care.   Consent: Patient/Guardian gives verbal consent for treatment and assignment of benefits for services provided during this visit. Patient/Guardian expressed understanding and agreed to proceed.   This note was generated in part or whole with voice recognition software. Voice recognition is usually quite accurate but there are transcription errors that can and very often do occur. I apologize for any typographical errors that were not detected and corrected.      Ursula Alert, MD 05/17/2022, 2:10 PM

## 2022-05-31 ENCOUNTER — Telehealth: Payer: Self-pay | Admitting: Psychiatry

## 2022-05-31 NOTE — Telephone Encounter (Signed)
Based on neuropsychological testing per Dr. Irven Shelling -patient does not meet criteria for ADHD.  Dated 03/29/2019 and 04/20/2019.

## 2022-06-04 ENCOUNTER — Encounter: Payer: BC Managed Care – PPO | Admitting: Internal Medicine

## 2022-06-14 ENCOUNTER — Ambulatory Visit (INDEPENDENT_AMBULATORY_CARE_PROVIDER_SITE_OTHER): Payer: BC Managed Care – PPO | Admitting: Licensed Clinical Social Worker

## 2022-06-14 DIAGNOSIS — F411 Generalized anxiety disorder: Secondary | ICD-10-CM | POA: Diagnosis not present

## 2022-06-14 NOTE — Progress Notes (Signed)
Consistent. Virtual Visit via Video Note  I connected with Chelsea Duncan on 06/14/22 at 11:00 AM EDT by a video enabled telemedicine application and verified that I am speaking with the correct person using two identifiers.  Location: Patient: home Provider: remote office [place], [place])   I discussed the limitations of evaluation and management by telemedicine and the availability of in person appointments. The patient expressed understanding and agreed to proceed.   I discussed the assessment and treatment plan with the patient. The patient was provided an opportunity to ask questions and all were answered. The patient agreed with the plan and demonstrated an understanding of the instructions.   The patient was advised to call back or seek an in-person evaluation if the symptoms worsen or if the condition fails to improve as anticipated.  I provided 40 minutes of non-face-to-face time during this encounter.   [place], LCSW  THERAPIST PROGRESS NOTE  Session Time: [id number]  Participation Level: Active  Behavioral Response: Neat and Well GroomedAlertAnxious  Type of Therapy: Individual Therapy  Treatment Goals addressed:Problem: Reduce overall frequency, intensity, and duration of the anxiety so that daily functioning is not impaired per pt self report 3 out of 5 sessions documented.   Goal: LTG: Patient will score less than 5 on the Generalized Anxiety Disorder 7 Scale (GAD-7) Outcome: Progressing Goal: STG: Patient will participate in at least 80% of scheduled individual psychotherapy sessions Outcome: Progressing Intervention: Assist with relaxation techniques, as appropriate (deep breathing exercises, meditation, guided imagery) Note: Reviewed; encouraged Intervention: Encourage patient to identify triggers Note: Assisted with identification  Intervention: Encourage self-care activities Note: Reviewed; encouraged    ProgressTowards Goals:  Progressing  Interventions: CBT and Motivational Interviewing  Summary: Chelsea Duncan is a 36 y.o. female who presents with improving symptoms related to anxiety diagnosis.   Allowed pt to explore and express thoughts and feelings associated with recent life situations and external stressors.Patient reports that she is continuing to experience stress associated with life balance. Patient reports that she is working a lot of hours, she is juggling household chores including parenting her three children while pregnant, and trying to maintain a positive relationship with husband. Patient reports that she has always been a multitasker, so it's second nature for her to always be doing something all of the time. Encouraged patient to try to have more quiet moments to herself, to allow her brain and body time to wind down and relax. Discussed several stress management/relaxation interventions that can be helpful: cooling down, calming down, mindfulness, and meditation based strategies. Pt reflects understanding.   Patient reports that since she is such a multitasker--she is wondering what is "normal stress" and what is "abnormal stress". Reviewed how stress can have an impact on body and mind. All stress, not just bad stress.  Patient reports that she actually has lowered her dosage of medication, and feels good about that. Patient reports that she's working more from home, and that seems to be a good fit for her life currently.  Continued recommendations are as follows: self care behaviors, positive social engagements, focusing on overall work/home/life balance, and focusing on positive physical and emotional wellness.  .   Suicidal/Homicidal: No  Therapist Response: Pt is continuing to apply interventions learned in session into daily life situations. Pt is currently on track to meet goals utilizing interventions mentioned above. Personal growth and progress noted. Treatment to continue as indicated.    Plan: Return again in 4 weeks.  Diagnosis: Encounter Diagnosis  Name Primary?   GAD (generalized anxiety disorder) Yes    Collaboration of Care: Other pt encouraged to continue with psychiatrist of record, Dr. Shea Evans  Patient/Guardian was advised Release of Information must be obtained prior to any record release in order to collaborate their care with an outside provider. Patient/Guardian was advised if they have not already done so to contact the registration department to sign all necessary forms in order for [place] to release information regarding their care.   Consent: Patient/Guardian gives verbal consent for treatment and assignment of benefits for services provided during this visit. Patient/Guardian expressed understanding and agreed to proceed.   [place], LCSW 06/14/2022

## 2022-06-18 NOTE — Plan of Care (Signed)
  Problem: Reduce overall frequency, intensity, and duration of the anxiety so that daily functioning is not impaired per pt self report 3 out of 5 sessions documented.   Goal: LTG: Patient will score less than 5 on the Generalized Anxiety Disorder 7 Scale (GAD-7) Outcome: Progressing Goal: STG: Patient will participate in at least 80% of scheduled individual psychotherapy sessions Outcome: Progressing Intervention: Assist with relaxation techniques, as appropriate (deep breathing exercises, meditation, guided imagery) Note: Reviewed; encouraged Intervention: Encourage patient to identify triggers Note: Assisted with identification  Intervention: Encourage self-care activities Note: Reviewed; encouraged

## 2022-06-29 ENCOUNTER — Other Ambulatory Visit: Payer: Self-pay | Admitting: Psychiatry

## 2022-06-29 DIAGNOSIS — F411 Generalized anxiety disorder: Secondary | ICD-10-CM

## 2022-06-29 DIAGNOSIS — F4010 Social phobia, unspecified: Secondary | ICD-10-CM

## 2022-07-26 ENCOUNTER — Telehealth (INDEPENDENT_AMBULATORY_CARE_PROVIDER_SITE_OTHER): Payer: BC Managed Care – PPO | Admitting: Psychiatry

## 2022-07-26 ENCOUNTER — Encounter: Payer: Self-pay | Admitting: Psychiatry

## 2022-07-26 DIAGNOSIS — G4701 Insomnia due to medical condition: Secondary | ICD-10-CM

## 2022-07-26 DIAGNOSIS — F4010 Social phobia, unspecified: Secondary | ICD-10-CM | POA: Diagnosis not present

## 2022-07-26 DIAGNOSIS — F4381 Prolonged grief disorder: Secondary | ICD-10-CM

## 2022-07-26 DIAGNOSIS — F411 Generalized anxiety disorder: Secondary | ICD-10-CM | POA: Diagnosis not present

## 2022-07-26 NOTE — Progress Notes (Signed)
Virtual Visit via Video Note  I connected with Chelsea Duncan on 07/26/22 at 10:30 AM EDT by a video enabled telemedicine application and verified that I am speaking with the correct person using two identifiers.  Location Provider Location : [facility] Patient Location : Home  Participants: Patient , Provider   I discussed the limitations of evaluation and management by telemedicine and the availability of in person appointments. The patient expressed understanding and agreed to proceed.    I discussed the assessment and treatment plan with the patient. The patient was provided an opportunity to ask questions and all were answered. The patient agreed with the plan and demonstrated an understanding of the instructions.   The patient was advised to call back or seek an in-person evaluation if the symptoms worsen or if the condition fails to improve as anticipated.  [place] MD OP Progress Note  07/26/2022 12:44 PM Chelsea Duncan  MRN:  [id number]  Chief Complaint:  Chief Complaint  Patient presents with   Follow-up   Anxiety   Depression   Medication Refill   HPI: Chelsea Duncan is a 36 year old Caucasian female, married, currently employed, lives in [place] with her spouse, has a history of GAD, social anxiety disorder, bereavement, insomnia, currently pregnant, LMP-02/07/2022,EGA [redacted] weeks,EDD 11/11/2022, plus for medication management.  Patient today reports she does not feel depressed.  She however continues to have anxiety, worries about things often, is often irritable.  She reports overall she has been doing okay at work and the anxiety does not seem to affect.  However it does affect her relationships at home although her spouse has been very supportive.  Patient reports she is currently working with her therapist and is motivated to stay in therapy.  Patient reports sleep is overall okay although restless due to being pregnant.  She has to wake up several times to urinate which is part of her  pregnancy.  She however reports overall she feels rested when she wakes up in the morning.  Patient is currently compliant on the Celexa low dosage of 10 mg daily.  Would like to stay on this dosage for now.  Not interested in dosage increase since she wants to complete her pregnancy before she goes up further.  Denies side effects.  Patient denies any suicidality, homicidality or perceptual disturbances.  Patient denies any other concerns today.  Visit Diagnosis:    ICD-10-CM   1. GAD (generalized anxiety disorder)  F41.1     2. 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  Past trials of medications like Zoloft, Celexa, Xanax. Reviewed neuropsychological testing per Dr. Irven Shelling -patient does not meet criteria for ADHD-dated 6/22/20220 and 04/20/2019.   Past Medical History:  Past Medical History:  Diagnosis Date   Allergy    Anxiety    Bone tumor (benign)    tumor vs cyst removed btwn age [age] y.o.    BRCA negative 03/2016   MyRisk neg except NBN VUS   Chicken pox    Complication of anesthesia    COVID-19    12/2019   Depression during pregnancy 08/29/2020   Family history of breast cancer 03/2016   IBIS=18.4%  She has been tested for at least BRCA and was negative (~2017). Tyrer-Cuzick (lifetime) was ~  18%.    PONV (postoperative nausea and vomiting)    Pyelonephritis    S/P skin biopsy    neck normal    UTI (urinary tract infection)     Past Surgical History:  Procedure Laterality Date   BONE CYST EXCISION     right foot.    Family Psychiatric History: Reviewed family psychiatric history from progress note on 10/11/2021.  Family History:  Family History  Problem Relation Age of Onset   Cancer Mother 53       breast / died at 58 in 12/13/09   Hypertension Mother     Depression Mother    Arthritis Father    Depression Father        committed suicide in December 13, 2014   Hypertension Father    Hypertension Sister    Heart disease Sister        heart murmur   Birth defects Sister    Depression Sister    Arthritis Maternal Grandmother    Other Maternal Grandmother        elevated tau protein; had corticobasal degneration    Cancer Paternal Grandmother        lung   Emphysema Paternal Grandfather     Social History: Reviewed social history from progress note on 10/11/2021. Social History   Socioeconomic History   Marital status: Married    Spouse name: Chelsea Duncan   Number of children: 3   Years of education: Not on file   Highest education level: Master's degree (e.g., MA, MS, MEng, MEd, MSW, MBA)  Occupational History   Not on file  Tobacco Use   Smoking status: Never   Smokeless tobacco: Never  Vaping Use   Vaping Use: Never used  Substance and Sexual Activity   Alcohol use: Yes    Comment: social   Drug use: No   Sexual activity: Yes    Partners: Male    Birth control/protection: None  Other Topics Concern   Not on file  Social History Narrative   Married 3 boys    Grew up in [place]    1 older sister 14 years older than her    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   Social Determinants of Radio broadcast assistant Strain: Not on file  Food Insecurity: Not on file  Transportation Needs: Not on file  Physical Activity: Not on file  Stress: Not on file  Social Connections: Not on file    Allergies:  Allergies  Allergen Reactions   Cefadroxil Nausea And Vomiting   Fire Ant Other (See Comments)    anaphylaxis anaphylaxis   Septra [Sulfamethoxazole-Trimethoprim] Nausea And Vomiting    Metabolic Disorder Labs: No results found for: "HGBA1C", "MPG" No results found for: "PROLACTIN" Lab Results  Component Value Date   CHOL 121 04/20/2021   TRIG 98.0 04/20/2021   HDL 34.40  (L) 04/20/2021   CHOLHDL 4 04/20/2021   VLDL 19.6 04/20/2021   LDLCALC 67 04/20/2021   LDLCALC 76 10/27/2018   Lab Results  Component Value Date   TSH 1.54 08/29/2021   TSH 3.10 04/20/2021    Therapeutic Level Labs: No results found for: "LITHIUM" No results found for: "VALPROATE" No results found for: "CBMZ"  Current Medications: Current Outpatient Medications  Medication Sig Dispense Refill   citalopram (CELEXA) 10 MG tablet TAKE 1 TABLET BY MOUTH EVERY DAY 90 tablet 0   cyanocobalamin (VITAMIN B12) 1000 MCG/ML injection  (Patient  not taking: Reported on 05/17/2022)     Prenatal Vit-Fe Fumarate-FA (PRENATAL MULTIVITAMIN) TABS tablet Take 1 tablet by mouth daily at 12 noon.     Syringe/Needle, Disp, 25G X 1-1/2" 5 ML MISC 1 Device by Does not apply route every 30 (thirty) days. (Patient not taking: Reported on 05/17/2022) 4 each 3   No current facility-administered medications for this visit.     Musculoskeletal: Strength & Muscle Tone:  UTA Gait & Station:  Seated Patient leans: N/A  Psychiatric Specialty Exam: Review of Systems  Psychiatric/Behavioral:  Positive for sleep disturbance. The patient is nervous/anxious.   All other systems reviewed and are negative.   not currently breastfeeding.There is no height or weight on file to calculate BMI.  General Appearance: Casual  Eye Contact:  Fair  Speech:  Clear and Coherent  Volume:  Normal  Mood:  Anxious  Affect:  Congruent  Thought Process:  Goal Directed and Descriptions of Associations: Intact  Orientation:  Full (Time, Place, and Person)  Thought Content: Logical   Suicidal Thoughts:  No  Homicidal Thoughts:  No  Memory:  Immediate;   Fair Recent;   Fair Remote;   Fair  Judgement:  Fair  Insight:  Fair  Psychomotor Activity:  Normal  Concentration:  Concentration: Fair and Attention Span: Fair  Recall:  [employer] of Knowledge: Fair  Language: Fair  Akathisia:  No  Handed:  Right  AIMS (if  indicated): not done  Assets:  Communication Skills Desire for Improvement Housing Social Support  ADL's:  Intact  Cognition: WNL  Sleep:   restless due to being pregnant   Screenings: AIMS    Flowsheet Row Video Visit from 05/17/2022 in [facility] Video Visit from 04/01/2022 in [facility] Total Score 0 0      AUDIT    [place] Visit from [facility]/11/2017 in [facility]  Alcohol Use Disorder Identification Test Final Score (AUDIT) 0      GAD-7    Flowsheet Row Video Visit from 07/26/2022 in [facility] Video Visit from 05/17/2022 in [facility] Video Visit from 04/01/2022 in [facility] Video Visit from 12/31/2021 in [facility] Counselor from 11/09/2021 in [facility]  Total GAD-7 Score '13 3 1 7 14      ' PHQ2-9    Flowsheet Row Video Visit from 07/26/2022 in [facility] Video Visit from 05/17/2022 in [facility] Video Visit from 04/01/2022 in [facility] Video Visit from 02/15/2022 in [facility] Video Visit from 01/22/2022 in [facility]  PHQ-2 Total Score 0 0 0 0 0      Flowsheet Row Video Visit from 07/26/2022 in [facility] Video Visit from 05/17/2022 in [facility] Video Visit from 04/01/2022 in [facility] No Risk No Risk No Risk        Assessment and Plan: Kaprice Kage is a 36 year old Caucasian female, married, lives in [place] with her spouse, has a history of GAD, social anxiety, bereavement, insomnia was evaluated by telemedicine today.  Patient with continued anxiety symptoms will continue to benefit from CBT, not  interested in medication changes at this time, patient is currently pregnant-expected date of delivery-11/11/2022.  Patient will benefit from the following plan.  Plan  GAD-unstable Celexa 10 mg p.o. daily-reduced dosage Patient is  not interested in medication changes. Patient advised to continue CBT with [name] and to call the clinic as needed.  Social anxiety disorder-improving Continue CBT  Insomnia-restless due to pregnancy Patient could try Doxylamine 25 mg at bedtime as needed.  Patient discussed with OB/GYN.  Bereavement disorder-improving Continue CBT  Follow-up in clinic in 2 months or sooner if needed.   Collaboration of Care: Collaboration of Care: Referral or follow-up with counselor/therapist AEB encouraged to continue follow-up with therapist.  Patient/Guardian was advised Release of Information must be obtained prior to any record release in order to collaborate their care with an outside provider. Patient/Guardian was advised if they have not already done so to contact the registration department to sign all necessary forms in order for [place] to release information regarding their care.   Consent: Patient/Guardian gives verbal consent for treatment and assignment of benefits for services provided during this visit. Patient/Guardian expressed understanding and agreed to proceed.   This note was generated in part or whole with voice recognition software. Voice recognition is usually quite accurate but there are transcription errors that can and very often do occur. I apologize for any typographical errors that were not detected and corrected.      Ursula Alert, MD 07/26/2022, 12:44 PM

## 2022-08-16 ENCOUNTER — Ambulatory Visit (HOSPITAL_COMMUNITY): Payer: BC Managed Care – PPO | Admitting: Licensed Clinical Social Worker

## 2022-08-24 ENCOUNTER — Other Ambulatory Visit: Payer: Self-pay | Admitting: Psychiatry

## 2022-08-24 DIAGNOSIS — F411 Generalized anxiety disorder: Secondary | ICD-10-CM

## 2022-08-24 DIAGNOSIS — F4010 Social phobia, unspecified: Secondary | ICD-10-CM

## 2022-08-27 ENCOUNTER — Ambulatory Visit (INDEPENDENT_AMBULATORY_CARE_PROVIDER_SITE_OTHER): Payer: BC Managed Care – PPO | Admitting: Licensed Clinical Social Worker

## 2022-08-27 DIAGNOSIS — F411 Generalized anxiety disorder: Secondary | ICD-10-CM

## 2022-08-27 NOTE — Plan of Care (Signed)
  Problem: Reduce overall frequency, intensity, and duration of the anxiety so that daily functioning is not impaired per pt self report 3 out of 5 sessions documented.   Goal: LTG: Patient will score less than 5 on the Generalized Anxiety Disorder 7 Scale (GAD-7) Outcome: Not Progressing Goal: STG: Patient will participate in at least 80% of scheduled individual psychotherapy sessions Outcome: Not Progressing

## 2022-08-27 NOTE — Progress Notes (Signed)
Consistent. Virtual Visit via Video Note  I connected with Chelsea Duncan on 08/27/22 at  2:00 PM EST by a video enabled telemedicine application and verified that I am speaking with the correct person using two identifiers.  Location: Patient: home Provider: remote office [place], [place])   I discussed the limitations of evaluation and management by telemedicine and the availability of in person appointments. The patient expressed understanding and agreed to proceed.   I discussed the assessment and treatment plan with the patient. The patient was provided an opportunity to ask questions and all were answered. The patient agreed with the plan and demonstrated an understanding of the instructions.   The patient was advised to call back or seek an in-person evaluation if the symptoms worsen or if the condition fails to improve as anticipated.  I provided 40 minutes of non-face-to-face time during this encounter.   [place], LCSW  THERAPIST PROGRESS NOTE  Session Time: 2-240p  Participation Level: Active  Behavioral Response: Neat and Well GroomedAlertAnxious  Type of Therapy: Individual Therapy  Treatment Goals addressed:Problem: Reduce overall frequency, intensity, and duration of the anxiety so that daily functioning is not impaired per pt self report 3 out of 5 sessions documented.   Goal: LTG: Patient will score less than 5 on the Generalized Anxiety Disorder 7 Scale (GAD-7) Outcome: Progressing Goal: STG: Patient will participate in at least 80% of scheduled individual psychotherapy sessions Outcome: Progressing Intervention: Assist with relaxation techniques, as appropriate (deep breathing exercises, meditation, guided imagery) Note: Reviewed; encouraged Intervention: Encourage patient to identify triggers Note: Assisted with identification  Intervention: Encourage self-care activities Note: Reviewed; encouraged  ProgressTowards Goals:  Progressing  Interventions: CBT and Motivational Interviewing  Summary: Chelsea Duncan is a 36 y.o. female who presents with improving symptoms related to anxiety diagnosis.   Allowed pt to explore and express thoughts and feelings associated with recent life situations and external stressors.Patient reports that she's continuing to experience stress associated with overall life balance, and feeling overwhelmed. Patient reports that the dosage of her anxiety medication has decreased since her pregnancy and patient feels that she is feeling more anxiety symptoms because of this. Patient has a lot of questions about etiology of symptoms--patient is wondering whether it's external stressors, decrease of medication, age, hormones. Patient reports that she is more anxious, irritable, and feeling over stimulated. Explained to patient that a lot of what she is feeling is the result of multiple variables, so it would be hard to pinpoint just one or two.   Reviewed importance of relaxation strategies, and being intentional about self-care. Discussed grounding exercises, focusing on slow living, being intentional about life balance, and the ability to set boundaries. Encouraged patient to get book on boundaries ([employer]send/[employer]cloud). Reviewed mindfulness strategies and encouraged patient to be mindful throughout the day each day.  Patient reports that she is trying hard to let go of certain tasks and behaviors, and delegating tasks to others. Patient reports that it is freeing to "surrender control" at times. Praised patient's ability to let some things go and encouraged her to receive as much help as she can get in the coming weeks.  Continued recommendations are as follows: self care behaviors, positive social engagements, focusing on overall work/home/life balance, and focusing on positive physical and emotional wellness.  .  Suicidal/Homicidal: No  Therapist Response: Pt is continuing to apply interventions  learned in session into daily life situations. Pt is currently on track to meet goals utilizing interventions mentioned above. Personal  growth and progress noted. Treatment to continue as indicated.   Plan: Return again in 4 weeks.  Diagnosis: Encounter Diagnosis  Name Primary?   GAD (generalized anxiety disorder) Yes   Collaboration of Care: Other pt encouraged to continue with psychiatrist of record, Dr. Shea Evans  Patient/Guardian was advised Release of Information must be obtained prior to any record release in order to collaborate their care with an outside provider. Patient/Guardian was advised if they have not already done so to contact the registration department to sign all necessary forms in order for [place] to release information regarding their care.   Consent: Patient/Guardian gives verbal consent for treatment and assignment of benefits for services provided during this visit. Patient/Guardian expressed understanding and agreed to proceed.   [place], LCSW 08/27/2022

## 2022-09-10 ENCOUNTER — Telehealth (INDEPENDENT_AMBULATORY_CARE_PROVIDER_SITE_OTHER): Payer: BC Managed Care – PPO | Admitting: Psychiatry

## 2022-09-10 ENCOUNTER — Encounter: Payer: Self-pay | Admitting: Psychiatry

## 2022-09-10 DIAGNOSIS — G4701 Insomnia due to medical condition: Secondary | ICD-10-CM

## 2022-09-10 DIAGNOSIS — F4010 Social phobia, unspecified: Secondary | ICD-10-CM | POA: Diagnosis not present

## 2022-09-10 DIAGNOSIS — F411 Generalized anxiety disorder: Secondary | ICD-10-CM | POA: Diagnosis not present

## 2022-09-10 DIAGNOSIS — F4381 Prolonged grief disorder: Secondary | ICD-10-CM

## 2022-09-10 NOTE — Progress Notes (Signed)
Virtual Visit via Video Note  I connected with Chelsea Duncan on 09/10/22 at  8:30 AM EST by a video enabled telemedicine application and verified that I am speaking with the correct person using two identifiers.  Location Provider Location : [facility] Patient Location : Work  Participants: Patient , Provider    I discussed the limitations of evaluation and management by telemedicine and the availability of in person appointments. The patient expressed understanding and agreed to proceed.   I discussed the assessment and treatment plan with the patient. The patient was provided an opportunity to ask questions and all were answered. The patient agreed with the plan and demonstrated an understanding of the instructions.   The patient was advised to call back or seek an in-person evaluation if the symptoms worsen or if the condition fails to improve as anticipated.    [place] MD OP Progress Note  09/10/2022 12:57 PM [name]  MRN:  [id number]  Chief Complaint:  Chief Complaint  Patient presents with   Follow-up   Anxiety   Medication Refill   HPI: Chelsea Duncan is a 36 year old Caucasian female, married, currently employed, lives in [place], with her spouse, has a history of GAD, social anxiety disorder, bereavement, insomnia, currently pregnant, LMP-02/07/2022, EDD-11/11/22, presented for medication management.  Patient today reports she stopped taking the Celexa since she was worried about taking this medication during third trimester.  Patient reports she was worried about all the data out there about problems that could happen to the baby if she takes this medication during third trimester.  Patient reports she has stopped taking the Celexa a week ago.  So far she has been doing fairly well.  Patient however reports she had a job opportunity recently.  Patient reports she went for the interview and made it to the top two, however they did not pick her and this has made her sad.  Patient  reports she would like to look for something that she is better qualified for in the future although she loves this current job.  Patient became tearful when she discussed this.  Agreeable to talk to therapist and have more frequent sessions now that she is not on the Celexa.  Reports sleep is restless on and off mostly because of the pregnancy.  She does feel tired often due to the pregnancy.    Denies any suicidality, homicidality or perceptual disturbances.  Patient denies any other concerns today.  Visit Diagnosis:    ICD-10-CM   1. GAD (generalized anxiety disorder)  F41.1     2. 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 Insomnia due to medical condition  G47.01       Past Psychiatric History: Reviewed past History from progress note on 10/11/2021.  Past trials of medications like Zoloft, Celexa, Xanax.  Reviewed neuropsychological testing per Dr. Donnita Falls does not meet criteria for ADHD-dated 03/29/2019 - 04/20/2019.  Past Medical History:  Past Medical History:  Diagnosis Date   Allergy    Anxiety    Bone tumor (benign)    tumor vs cyst removed btwn age [age] y.o.    BRCA negative 03/2016   MyRisk neg except NBN VUS   Chicken pox    Complication of anesthesia    COVID-19    12/2019   Depression during pregnancy 08/29/2020   Family history of breast cancer 03/2016   IBIS=18.4%  She has been tested for  at least BRCA and was negative 11/06/15). Tyrer-Cuzick (lifetime) was ~18%.    PONV (postoperative nausea and vomiting)    Pyelonephritis    S/P skin biopsy    neck normal    UTI (urinary tract infection)     Past Surgical History:  Procedure Laterality Date   BONE CYST EXCISION     right foot.    Family Psychiatric History: Reviewed family psychiatric history from progress note on 10/11/2021.  Family History:  Family History  Problem Relation Age of Onset   Cancer Mother 80       breast / died at 62 in  11/05/2009   Hypertension Mother    Depression Mother    Arthritis Father    Depression Father        committed suicide in 11-05-2014   Hypertension Father    Hypertension Sister    Heart disease Sister        heart murmur   Birth defects Sister    Depression Sister    Arthritis Maternal Grandmother    Other Maternal Grandmother        elevated tau protein; had corticobasal degneration    Cancer Paternal Grandmother        lung   Emphysema Paternal Grandfather     Social History: Reviewed social history from progress note on 10/11/2021. Social History   Socioeconomic History   Marital status: Married    Spouse name: [email or web address]   Number of children: 3   Years of education: Not on file   Highest education level: Master's degree (e.g., MA, MS, MEng, MEd, MSW, MBA)  Occupational History   Not on file  Tobacco Use   Smoking status: Never   Smokeless tobacco: Never  Vaping Use   Vaping Use: Never used  Substance and Sexual Activity   Alcohol use: Yes    Comment: social   Drug use: No   Sexual activity: Yes    Partners: Male    Birth control/protection: None  Other Topics Concern   Not on file  Social History Narrative   Married 3 boys    Grew up in [place]    1 older sister 78 years older than her    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   Social Determinants of Radio broadcast assistant Strain: Not on file  Food Insecurity: Not on file  Transportation Needs: Not on file  Physical Activity: Not on file  Stress: Not on file  Social Connections: Not on file    Allergies:  Allergies  Allergen Reactions   Cefadroxil Nausea And Vomiting   Fire Ant Other (See Comments)    anaphylaxis anaphylaxis   Septra [Sulfamethoxazole-Trimethoprim] Nausea And Vomiting    Metabolic Disorder Labs: No results found for: "HGBA1C", "MPG" No results found for: "PROLACTIN" Lab Results  Component Value Date   CHOL 121 04/20/2021    TRIG 98.0 04/20/2021   HDL 34.40 (L) 04/20/2021   CHOLHDL 4 04/20/2021   VLDL 19.6 04/20/2021   LDLCALC 67 04/20/2021   LDLCALC 76 10/27/2018   Lab Results  Component Value Date   TSH 1.54 08/29/2021   TSH 3.10 04/20/2021    Therapeutic Level Labs: No results found for: "LITHIUM" No results found for: "VALPROATE" No results found for: "CBMZ"  Current Medications: Current Outpatient Medications  Medication Sig Dispense Refill   cyanocobalamin (VITAMIN B12) 1000 MCG/ML injection      Prenatal Vit-Fe Fumarate-FA (PRENATAL  MULTIVITAMIN) TABS tablet Take 1 tablet by mouth daily at 12 noon.     Syringe/Needle, Disp, 25G X 1-1/2" 5 ML MISC 1 Device by Does not apply route every 30 (thirty) days. 4 each 3   citalopram (CELEXA) 10 MG tablet TAKE 1 TABLET BY MOUTH EVERY DAY (Patient not taking: Reported on 09/10/2022) 90 tablet 0   No current facility-administered medications for this visit.     Musculoskeletal: Strength & Muscle Tone:  UTA Gait & Station:  Seated Patient leans: N/A  Psychiatric Specialty Exam: Review of Systems  Psychiatric/Behavioral:  The patient is nervous/anxious.   All other systems reviewed and are negative.   not currently breastfeeding.There is no height or weight on file to calculate BMI.  General Appearance: Casual  Eye Contact:  Fair  Speech:  Normal Rate  Volume:  Normal  Mood:  Anxious  Affect:  Congruent  Thought Process:  Goal Directed and Descriptions of Associations: Intact  Orientation:  Full (Time, Place, and Person)  Thought Content: Logical   Suicidal Thoughts:  No  Homicidal Thoughts:  No  Memory:  Immediate;   Fair Recent;   Fair Remote;   Fair  Judgement:  Fair  Insight:  Fair  Psychomotor Activity:  Normal  Concentration:  Concentration: Fair and Attention Span: Fair  Recall:  [employer] of Knowledge: Fair  Language: Fair  Akathisia:  No  Handed:  Right  AIMS (if indicated): not done  Assets:  Communication  Skills Desire for Improvement Housing Intimacy Talents/Skills Transportation  ADL's:  Intact  Cognition: WNL  Sleep:   restless   Screenings: AIMS    Flowsheet Row Video Visit from 05/17/2022 in [facility] [place] Video Visit from 04/01/2022 in [place] Total Score 0 0      AUDIT    [place] Visit from 06/11/2017 in [facility]  Alcohol Use Disorder Identification Test Final Score (AUDIT) 0      GAD-7    Flowsheet Row Video Visit from 07/26/2022 in [facility] Video Visit from 05/17/2022 in [place] Video Visit from 04/01/2022 in [place] Video Visit from 12/31/2021 in [place] Counselor from 11/09/2021 in [place]  Total GAD-7 Score _0 PHQ2-9    [place] Video Visit from 09/10/2022 in [place] Counselor from 08/27/2022 in [place] Video Visit from 07/26/2022 in [place] Video Visit from 05/17/2022 in [place] Video Visit from 04/01/2022 in [place]  PHQ-2 Total Score 0 0 0 0 0  PHQ-9 Total Score 4 -- -- -- --      Flowsheet Row Video Visit from 09/10/2022 in [place] Counselor from 08/27/2022 in [facility] Video Visit from 07/26/2022 in [place] No Risk No Risk No Risk        Assessment and Plan: Leoma Folds is a 36 year old Caucasian female, married, lives in [place] with her spouse, has a history of GAD, social anxiety, bereavement, insomnia was evaluated by telemedicine today.  Patient is currently off of Celexa, would like to continue  psychotherapy more frequently.  Plan as noted below.  Plan GAD-improving Patient advised to continue CBT I have also communicated with [name], coordinated care. Discontinue Celexa due to noncompliance.  Also discussed breast-feeding implications of Celexa.  Patient would like to restart this medication after her delivery.  Social anxiety disorder-improving Continue CBT  Insomnia-restless due to pregnancy Could try doxylamine 25 mg at bedtime as needed  Bereavement disorder-improving Continue CBT  Follow-up in clinic in 2 months or sooner if needed. Collaboration of Care: Collaboration of Care: Referral or follow-up with counselor/therapist AEB encouraged to have more frequent sessions with therapist-I have coordinated care.  Patient/Guardian was advised Release of Information must be obtained prior to any record release in order to collaborate their care with an outside provider. Patient/Guardian was advised if they have not already done so to contact the registration department to sign all necessary forms in order for [place] to release information regarding their care.   Consent: Patient/Guardian gives verbal consent for treatment and assignment of benefits for services provided during this visit. Patient/Guardian expressed understanding and agreed to proceed.   This note was generated in part or whole with voice recognition software. Voice recognition is usually quite accurate but there are transcription errors that can and very often do occur. I apologize for any typographical errors that were not detected and corrected.      Ursula Alert, MD 09/10/2022, 12:57 PM

## 2022-09-16 ENCOUNTER — Telehealth: Payer: Self-pay | Admitting: Psychiatry

## 2022-09-23 ENCOUNTER — Other Ambulatory Visit: Payer: Self-pay | Admitting: Psychiatry

## 2022-09-23 DIAGNOSIS — F411 Generalized anxiety disorder: Secondary | ICD-10-CM

## 2022-09-23 DIAGNOSIS — F4010 Social phobia, unspecified: Secondary | ICD-10-CM

## 2022-10-07 NOTE — L&D Delivery Note (Signed)
Delivery Note At 2:50 PM a viable mild infant was delivered via Vaginal, Spontaneous (Presentation: Right Occiput Anterior).  APGAR: .9, 10 ; weight 3,410 grams (7 lb 8oz).   Placenta status: Spontaneous, Intact.  Cord: 3 vessels with the following complications: None.  Cord pH: n/a  Anesthesia: None Episiotomy:  jnone Lacerations:  1st degree perineal Suture Repair:  n/a Est. Blood Loss (mL):  100 mL (pending quantitative)  Mom to postpartum.  Baby to Couplet care / Skin to Skin.  Called to see patient.  Mom pushed to deliver a viable female infant.  The head followed by shoulders, which delivered without difficulty, and the rest of the body.  A single nuchal cord noted and delivered through.  Baby to mom's chest.  Cord clamped and cut after > 1 min delay.  No cord blood obtained.  Placenta delivered spontaneously, intact, with a 3-vessel cord.  Small first degree perineal laceration not repaired as it was hemostatic and re-approximates when legs in neutral position. All counts correct.  Hemostasis obtained with IV pitocin and fundal massage. EBL 100 mL, pending QBL  Prentice Docker, MD 11/10/2022, 3:11 PM

## 2022-10-15 ENCOUNTER — Other Ambulatory Visit: Payer: Self-pay | Admitting: Obstetrics and Gynecology

## 2022-10-15 ENCOUNTER — Ambulatory Visit
Admission: RE | Admit: 2022-10-15 | Discharge: 2022-10-15 | Disposition: A | Discharge status: Home / Self Care | Payer: BC Managed Care – PPO | Source: Ambulatory Visit | Attending: Obstetrics and Gynecology | Admitting: Obstetrics and Gynecology

## 2022-10-15 DIAGNOSIS — Z3689 Encounter for other specified antenatal screening: Secondary | ICD-10-CM | POA: Insufficient documentation

## 2022-10-15 DIAGNOSIS — Z3483 Encounter for supervision of other normal pregnancy, third trimester: Secondary | ICD-10-CM

## 2022-10-15 DIAGNOSIS — Z3A35 35 weeks gestation of pregnancy: Secondary | ICD-10-CM | POA: Diagnosis not present

## 2022-10-22 ENCOUNTER — Ambulatory Visit (HOSPITAL_COMMUNITY): Payer: BC Managed Care – PPO | Admitting: Licensed Clinical Social Worker

## 2022-10-22 LAB — OB RESULTS CONSOLE HIV ANTIBODY (ROUTINE TESTING): HIV: NONREACTIVE

## 2022-10-22 LAB — OB RESULTS CONSOLE GBS: GBS: NEGATIVE

## 2022-10-22 LAB — OB RESULTS CONSOLE RPR: RPR: NONREACTIVE

## 2022-10-22 LAB — OB RESULTS CONSOLE GC/CHLAMYDIA
Chlamydia: NEGATIVE
Neisseria Gonorrhea: NEGATIVE

## 2022-11-05 ENCOUNTER — Other Ambulatory Visit: Payer: Self-pay | Admitting: Obstetrics and Gynecology

## 2022-11-05 DIAGNOSIS — Z3490 Encounter for supervision of normal pregnancy, unspecified, unspecified trimester: Secondary | ICD-10-CM

## 2022-11-10 ENCOUNTER — Encounter: Payer: Self-pay | Admitting: General Practice

## 2022-11-10 ENCOUNTER — Other Ambulatory Visit: Payer: Self-pay

## 2022-11-10 ENCOUNTER — Inpatient Hospital Stay
Admission: EM | Admit: 2022-11-10 | Discharge: 2022-11-13 | DRG: 768 | Disposition: A | Discharge status: Home / Self Care | Payer: BC Managed Care – PPO | Attending: Obstetrics and Gynecology | Admitting: Obstetrics and Gynecology

## 2022-11-10 DIAGNOSIS — O9902 Anemia complicating childbirth: Secondary | ICD-10-CM | POA: Diagnosis present

## 2022-11-10 DIAGNOSIS — D62 Acute posthemorrhagic anemia: Secondary | ICD-10-CM | POA: Diagnosis not present

## 2022-11-10 DIAGNOSIS — Z3490 Encounter for supervision of normal pregnancy, unspecified, unspecified trimester: Principal | ICD-10-CM | POA: Diagnosis present

## 2022-11-10 DIAGNOSIS — O26893 Other specified pregnancy related conditions, third trimester: Secondary | ICD-10-CM | POA: Diagnosis present

## 2022-11-10 DIAGNOSIS — O99893 Other specified diseases and conditions complicating puerperium: Secondary | ICD-10-CM | POA: Diagnosis not present

## 2022-11-10 DIAGNOSIS — R58 Hemorrhage, not elsewhere classified: Secondary | ICD-10-CM | POA: Diagnosis not present

## 2022-11-10 DIAGNOSIS — Z8616 Personal history of COVID-19: Secondary | ICD-10-CM | POA: Diagnosis not present

## 2022-11-10 DIAGNOSIS — Z302 Encounter for sterilization: Secondary | ICD-10-CM

## 2022-11-10 DIAGNOSIS — S37591A Other injury of fallopian tube, unilateral, initial encounter: Secondary | ICD-10-CM | POA: Diagnosis not present

## 2022-11-10 DIAGNOSIS — K661 Hemoperitoneum: Secondary | ICD-10-CM | POA: Diagnosis not present

## 2022-11-10 DIAGNOSIS — Z3A39 39 weeks gestation of pregnancy: Secondary | ICD-10-CM | POA: Diagnosis not present

## 2022-11-10 LAB — CBC
HCT: 34.7 % — ABNORMAL LOW (ref 36.0–46.0)
Hemoglobin: 11.4 g/dL — ABNORMAL LOW (ref 12.0–15.0)
MCH: 28.1 pg (ref 26.0–34.0)
MCHC: 32.9 g/dL (ref 30.0–36.0)
MCV: 85.7 fL (ref 80.0–100.0)
Platelets: 263 10*3/uL (ref 150–400)
RBC: 4.05 MIL/uL (ref 3.87–5.11)
RDW: 14.1 % (ref 11.5–15.5)
WBC: 8 10*3/uL (ref 4.0–10.5)
nRBC: 0 % (ref 0.0–0.2)

## 2022-11-10 LAB — RPR: RPR Ser Ql: NONREACTIVE

## 2022-11-10 MED ORDER — LIDOCAINE HCL (PF) 1 % IJ SOLN: 30.0000 mL | INTRAMUSCULAR | Status: DC | PRN

## 2022-11-10 MED ORDER — COCONUT OIL OIL: 1.0000 | TOPICAL_OIL | Status: DC | PRN

## 2022-11-10 MED ORDER — MISOPROSTOL 25 MCG QUARTER TABLET: 25.0000 ug | ORAL_TABLET | ORAL | Status: DC | PRN

## 2022-11-10 MED ORDER — BENZOCAINE-MENTHOL 20-0.5 % EX AERO
1.0000 | INHALATION_SPRAY | CUTANEOUS | Status: DC | PRN
  Administered 2022-11-10: 1 via TOPICAL
  Filled 2022-11-10: qty 56

## 2022-11-10 MED ORDER — DEXTROSE-NACL 5-0.9 % IV SOLN: INTRAVENOUS | Status: DC

## 2022-11-10 MED ORDER — LACTATED RINGERS IV SOLN: 500.0000 mL | INTRAVENOUS | Status: DC | PRN

## 2022-11-10 MED ORDER — PRENATAL MULTIVITAMIN CH
1.0000 | ORAL_TABLET | Freq: Every day | ORAL | Status: DC
  Administered 2022-11-12: 1 via ORAL
  Filled 2022-11-10: qty 1

## 2022-11-10 MED ORDER — POVIDONE-IODINE 10 % EX SWAB
2.0000 | Freq: Once | CUTANEOUS | Status: AC
  Administered 2022-11-10: 2 via TOPICAL

## 2022-11-10 MED ORDER — FENTANYL-BUPIVACAINE-NACL 0.5-0.125-0.9 MG/250ML-% EP SOLN
EPIDURAL | Status: AC
  Filled 2022-11-10: qty 250

## 2022-11-10 MED ORDER — ONDANSETRON HCL 4 MG/2ML IJ SOLN
4.0000 mg | Freq: Four times a day (QID) | INTRAMUSCULAR | Status: DC | PRN
  Administered 2022-11-10: 4 mg via INTRAVENOUS
  Filled 2022-11-10: qty 2

## 2022-11-10 MED ORDER — SOD CITRATE-CITRIC ACID 500-334 MG/5ML PO SOLN: 30.0000 mL | ORAL | Status: DC | PRN

## 2022-11-10 MED ORDER — EPHEDRINE 5 MG/ML INJ: 10.0000 mg | INTRAVENOUS | Status: DC | PRN

## 2022-11-10 MED ORDER — ACETAMINOPHEN 325 MG PO TABS
650.0000 mg | ORAL_TABLET | Freq: Four times a day (QID) | ORAL | Status: DC | PRN
  Administered 2022-11-10: 650 mg via ORAL
  Filled 2022-11-10: qty 2

## 2022-11-10 MED ORDER — DIBUCAINE (PERIANAL) 1 % EX OINT: 1.0000 | TOPICAL_OINTMENT | CUTANEOUS | Status: DC | PRN

## 2022-11-10 MED ORDER — LACTATED RINGERS IV SOLN: INTRAVENOUS | Status: DC

## 2022-11-10 MED ORDER — OXYTOCIN BOLUS FROM INFUSION
333.0000 mL | Freq: Once | INTRAVENOUS | Status: AC
  Administered 2022-11-10: 333 mL via INTRAVENOUS

## 2022-11-10 MED ORDER — TERBUTALINE SULFATE 1 MG/ML IJ SOLN: 0.2500 mg | Freq: Once | INTRAMUSCULAR | Status: DC | PRN

## 2022-11-10 MED ORDER — OXYTOCIN 10 UNIT/ML IJ SOLN
INTRAMUSCULAR | Status: AC
  Filled 2022-11-10: qty 2

## 2022-11-10 MED ORDER — OXYTOCIN-SODIUM CHLORIDE 30-0.9 UT/500ML-% IV SOLN
2.5000 [IU]/h | INTRAVENOUS | Status: DC
  Administered 2022-11-10: 2.5 [IU]/h via INTRAVENOUS

## 2022-11-10 MED ORDER — OXYTOCIN-SODIUM CHLORIDE 30-0.9 UT/500ML-% IV SOLN
INTRAVENOUS | Status: AC
  Administered 2022-11-10: 2 m[IU]/min via INTRAVENOUS
  Filled 2022-11-10: qty 500

## 2022-11-10 MED ORDER — ACETAMINOPHEN 325 MG PO TABS
650.0000 mg | ORAL_TABLET | ORAL | Status: DC | PRN
  Administered 2022-11-10: 650 mg via ORAL
  Filled 2022-11-10: qty 2

## 2022-11-10 MED ORDER — HYDROCODONE-ACETAMINOPHEN 5-325 MG PO TABS: 1.0000 | ORAL_TABLET | Freq: Three times a day (TID) | ORAL | Status: DC | PRN

## 2022-11-10 MED ORDER — ONDANSETRON HCL 4 MG PO TABS: 4.0000 mg | ORAL_TABLET | ORAL | Status: DC | PRN

## 2022-11-10 MED ORDER — IBUPROFEN 600 MG PO TABS
600.0000 mg | ORAL_TABLET | Freq: Four times a day (QID) | ORAL | Status: DC | PRN
  Administered 2022-11-10 – 2022-11-11 (×4): 600 mg via ORAL
  Filled 2022-11-10 (×4): qty 1

## 2022-11-10 MED ORDER — LACTATED RINGERS IV SOLN: 500.0000 mL | Freq: Once | INTRAVENOUS | Status: DC

## 2022-11-10 MED ORDER — MISOPROSTOL 200 MCG PO TABS
ORAL_TABLET | ORAL | Status: AC
  Filled 2022-11-10: qty 4

## 2022-11-10 MED ORDER — WITCH HAZEL-GLYCERIN EX PADS: 1.0000 | MEDICATED_PAD | CUTANEOUS | Status: DC | PRN

## 2022-11-10 MED ORDER — LIDOCAINE HCL (PF) 1 % IJ SOLN
INTRAMUSCULAR | Status: AC
  Filled 2022-11-10: qty 30

## 2022-11-10 MED ORDER — SIMETHICONE 80 MG PO CHEW
80.0000 mg | CHEWABLE_TABLET | ORAL | Status: DC | PRN
  Administered 2022-11-11 (×2): 80 mg via ORAL
  Filled 2022-11-10 (×3): qty 1

## 2022-11-10 MED ORDER — PHENYLEPHRINE 80 MCG/ML (10ML) SYRINGE FOR IV PUSH (FOR BLOOD PRESSURE SUPPORT): 80.0000 ug | PREFILLED_SYRINGE | INTRAVENOUS | Status: DC | PRN

## 2022-11-10 MED ORDER — ONDANSETRON HCL 4 MG/2ML IJ SOLN
4.0000 mg | INTRAMUSCULAR | Status: DC | PRN
  Administered 2022-11-11: 4 mg via INTRAVENOUS
  Filled 2022-11-10: qty 2

## 2022-11-10 MED ORDER — OXYTOCIN 10 UNIT/ML IJ SOLN: 10.0000 [IU] | Freq: Once | INTRAMUSCULAR | Status: DC

## 2022-11-10 MED ORDER — FERROUS SULFATE 325 (65 FE) MG PO TABS
325.0000 mg | ORAL_TABLET | Freq: Two times a day (BID) | ORAL | Status: DC
  Administered 2022-11-11 – 2022-11-13 (×3): 325 mg via ORAL
  Filled 2022-11-10 (×3): qty 1

## 2022-11-10 MED ORDER — AMMONIA AROMATIC IN INHA
RESPIRATORY_TRACT | Status: AC
  Filled 2022-11-10: qty 10

## 2022-11-10 MED ORDER — FENTANYL-BUPIVACAINE-NACL 0.5-0.125-0.9 MG/250ML-% EP SOLN: 12.0000 mL/h | EPIDURAL | Status: DC | PRN

## 2022-11-10 MED ORDER — SENNOSIDES-DOCUSATE SODIUM 8.6-50 MG PO TABS
2.0000 | ORAL_TABLET | ORAL | Status: DC
  Administered 2022-11-11: 2 via ORAL
  Filled 2022-11-10: qty 2

## 2022-11-10 MED ORDER — OXYTOCIN-SODIUM CHLORIDE 30-0.9 UT/500ML-% IV SOLN: 1.0000 m[IU]/min | INTRAVENOUS | Status: DC

## 2022-11-10 MED ORDER — DIPHENHYDRAMINE HCL 50 MG/ML IJ SOLN: 12.5000 mg | INTRAMUSCULAR | Status: DC | PRN

## 2022-11-10 MED ORDER — DIPHENHYDRAMINE HCL 25 MG PO CAPS: 25.0000 mg | ORAL_CAPSULE | Freq: Four times a day (QID) | ORAL | Status: DC | PRN

## 2022-11-10 NOTE — H&P (Signed)
OB History & Physical   History of Present Illness:  Chief Complaint: I'm here for elective induction of labor  HPI:  Chelsea Duncan is a 37 y.o. [id number] female at 20w3ddated by LMP consistent with a 7 week ultrasound.  Her pregnancy has been uncomplicated.    She denies contractions.   She denies leakage of fluid.   She denies vaginal bleeding.   She reports fetal movement.    Total weight gain for pregnancy: Not found.   Obstetrical Problem List: February 2024 Problems (from 11/10/22 to present)     No problems associated with this episode.        Maternal Medical History:   Past Medical History:  Diagnosis Date   Allergy    Anxiety    Bone tumor (benign)    tumor vs cyst removed btwn age [age]y.o.    BRCA negative 03/2016   MyRisk neg except NBN VUS   Chicken pox    Complication of anesthesia    COVID-19    12/2019   Depression during pregnancy 08/29/2020   Family history of breast cancer 03/2016   IBIS=18.4%  She has been tested for at least BRCA and was negative (~2017). Tyrer-Cuzick (lifetime) was ~18%.    PONV (postoperative nausea and vomiting)    Pyelonephritis    S/P skin biopsy    neck normal    UTI (urinary tract infection)     Past Surgical History:  Procedure Laterality Date   BONE CYST EXCISION     right foot.    Allergies  Allergen Reactions   Cefadroxil Nausea And Vomiting   Fire Ant Other (See Comments)    anaphylaxis anaphylaxis   Septra [Sulfamethoxazole-Trimethoprim] Nausea And Vomiting    Prior to Admission medications   Medication Sig Start Date End Date Taking? Authorizing Provider  cyanocobalamin (VITAMIN B12) 1000 MCG/ML injection  04/22/22  Yes [provider]  Prenatal Vit-Fe Fumarate-FA (PRENATAL MULTIVITAMIN) TABS tablet Take 1 tablet by mouth daily at 12 noon.   Yes [provider]  Syringe/Needle, Disp, 25G X 1-1/2" 5 ML MISC 1 Device by Does not apply route every 30 (thirty) days. 04/24/21  Yes  McLean-Scocuzza, TNino Glow MD  citalopram (CELEXA) 10 MG tablet TAKE 1 TABLET BY MOUTH EVERY DAY Patient not taking: Reported on 11/10/2022 07/01/22   EUrsula Alert MD    OB History  Gravida Para Term Preterm AB Living  '4 3 3 '$ 0 0 3  SAB IAB Ectopic Multiple Live Births  0 0 0 0 3    # Outcome Date GA Lbr Len/2nd Weight Sex Delivery Anes PTL Lv  4 Current           3 Term 10/21/20 310w3d9:35 / 00:30 4020 g M Vag-Spont EPI  LIV  2 Term 01/18/16 3824w1d00:21 3240 g M Vag-Spont EPI  LIV  1 Term 06/03/14 [id number] Vag-Spont   LIV    [facility]  Social History: She  reports that she has never smoked. She has never used smokeless tobacco. She reports current alcohol use. She reports that she does not use drugs.  Family History: family history includes Arthritis in her father and maternal grandmother; Birth defects in her sister; Cancer in her paternal grandmother; Cancer (age of onset: 36) 49 her mother; Depression in her father, mother, and sister; Emphysema in her paternal grandfather; Heart disease in her sister; Hypertension in her father, mother, and sister; Other in  her maternal grandmother.   Review of Systems:  Review of Systems  Constitutional: Negative.   HENT: Negative.    Eyes: Negative.   Respiratory: Negative.    Cardiovascular: Negative.   Gastrointestinal: Negative.   Genitourinary: Negative.   Musculoskeletal: Negative.   Skin: Negative.   Neurological: Negative.   Psychiatric/Behavioral: Negative.       Physical Exam:  BP (P) 129/75 (BP Location: Left Arm)   Pulse 92   Temp 97.9 F (36.6 C) (Oral)   Ht '5\' 3"'$  (1.6 m)   Wt 64.4 kg   BMI 25.15 kg/m   Physical Exam Constitutional:      General: She is not in acute distress.    Appearance: Normal appearance. She is well-developed.  Genitourinary:     Genitourinary Comments: AROM: Clear fluid     Right Labia: No rash, tenderness, lesions, skin changes or Bartholin's cyst.     Left Labia: No tenderness, lesions, skin changes, Bartholin's cyst or rash.    Cervical exam comments: 3.5/60/-1/soft/posterior.  HENT:     Head: Normocephalic and atraumatic.  Eyes:     General: No scleral icterus.    Conjunctiva/sclera: Conjunctivae normal.  Cardiovascular:     Rate and Rhythm: Normal rate and regular rhythm.     Heart sounds: No murmur heard.    No friction rub. No gallop.  Pulmonary:     Effort: Pulmonary effort is normal. No respiratory distress.     Breath sounds: Normal breath sounds. No wheezing or rales.  Abdominal:     General: Bowel sounds are normal. There is no distension.     Palpations: Abdomen is soft. There is mass (gravid, NT).     Tenderness: There is no abdominal tenderness. There is no guarding or rebound.  Musculoskeletal:        General: Normal range of motion.     Cervical back: Normal range of motion and neck supple.  Neurological:     General: No focal deficit present.     Mental Status: She is alert and oriented to person, place, and time.     Cranial Nerves: No cranial nerve deficit.  Skin:    General: Skin is warm and dry.     Findings: No erythema.  Psychiatric:        Mood and Affect: Mood normal.        Behavior: Behavior normal.        Judgment: Judgment normal.   Female chaperone present for pelvic exam:   Pertinent Results:  Prenatal Labs Blood type/Rh A positive  Antibody screen negative  Rubella Immune  Varicella Immune    RPR NR  HBsAg negative  HIV negative  GC negative  Chlamydia negative  Genetic screening NIPS diploid XY  1 hour GTT 98  3 hour GTT N/a  GBS negative on 10/22/2022   Baseline FHR: 135 beats/min   Variability: moderate   Accelerations: present   Decelerations: absent Contractions: present frequency: 3 q 10 min Overall assessment: cat 1  Bedside Ultrasound:  Number of Fetus: 1  Presentation: cephalic  Fluid: subjectively normal  Assessment:  Chelsea Duncan is a 37 y.o. [id number] female at  28w3dwith elective induction of labor.   Plan:  Admit to Labor & Delivery  CBC, T&S, Clrs, IVF GBS negative.   Fetwal well-being: reassuing AROM, clear fluid, pitocin with slow increase.  Expect vaginal deliveyr   SPrentice Docker MD 11/10/2022 10:12 AM

## 2022-11-10 NOTE — Discharge Summary (Signed)
Postpartum Discharge Summary    Patient Name: Chelsea Duncan DOB: [birth year] MRN: [id number]  Date of admission: 11/10/2022 Delivery date:11/10/2022  Delivering provider: Prentice Docker D  Date of discharge: 11/13/2022  Admitting diagnosis: Encounter for elective induction of labor [Z34.90] Intrauterine pregnancy: [redacted]w[redacted]d    Secondary diagnosis:  Active Problems:   Encounter for elective induction of labor   [redacted] weeks gestation of pregnancy   Admission for sterilization   Intraabdominal hemorrhage  Additional problems: intra-abdominal hemorrhage after tubal ligation surgery    Discharge diagnosis: Term Pregnancy Delivered                                              Post partum procedures:postpartum tubal ligation (on 11/11/2022), laparoscopic bilateral salpingectomy, repair of left fallopian tube tear and surrounding vessels, evacuation of hemoperitoneum (on 11/12/2022), administration of 2 units pRBCs, CT abdomen pelvis. Augmentation: AROM and Pitocin Complications: None  Hospital course: Induction of Labor With Vaginal Delivery   37y.o. yo [id number] admitted to the hospital 11/10/2022 for induction of labor.  Indication for induction: Elective.  Patient had an labor course complicated by none. Membrane Rupture Time/Date: 9:57 AM ,11/10/2022   Delivery Method:Vaginal, Spontaneous  Episiotomy: None  Lacerations:  1st degree , perineal (not repaired) Details of delivery can be found in separate delivery note.  On PPD#1 she was taken to the operating room and underwent a BTL.  After surgery she had hypotension and tachycardia that was relieved by an IV fluid bolus. Initially this was attributed to an anesthesia reaction with the thought that there could be a postoperative intra-abdominal bleed.  A CBC at that time showed a modest drop in hemoglobin.  Later in the day she had similar symptoms and her pain became much worse.  A repeat CBC showed another drop in hemoglobin that was  significant. She had a STAT CT abd/pelvis ordered and this showed hemoperitoneum.  She was transfused 2 units of pRBCs.  On PPD#2 she went to the operating room to repair the area from which she was bleeding. Both fallopian tubes were removed in order to prevent a failure of the tubal ligation.  The surgery required significant cauterization of the nearby vessels in order to achieve hemostasis. Her hemoperitoneum of approximately 1,800 mL was also evacuated.  She tolerated this procedure well. After her surgery she did much better. Her vital signs remained stable. Her pain was well controlled on oral pain medications, she tolerated PO, she was able to ambulate and void without difficulty. Her hemoglobin settled around an expected value of 8.2 by discharge. Given her stability for about 24 hours with normal vitals and a normal recovery after her second surgery, she was deemed stable for discharge. Patient is discharged home 11/13/22.  Newborn Data: Birth date:11/10/2022  Birth time:2:50 PM  Gender:Female  "TyLenor CoffinLiving status:Living  Apgars:8 ,10  Weight:3410 g  (7 lb 8 oz)  Magnesium Sulfate received: No BMZ received: No Rhophylac:N/A MMR:No T-DaP:Given prenatally Flu: Yes Transfusion:Yes (2 units pRBCs on PPD#1 and PPD#2) due to post-op hemorrhage.   Physical exam  Vitals:   11/12/22 1947 11/12/22 2315 11/13/22 0232 11/13/22 0750  BP: 124/79 120/67 112/64 117/75  Pulse: 81 85 75 84  Resp: '20 20 18 18  '$ Temp: 98.9 F (37.2 C) (!) 97.5 F (36.4 C) 98.4 F (36.9 C) 98.5 F (  36.9 C)  TempSrc: Oral Oral Oral Oral  SpO2: 100% 100% 99% 100%  Weight:      Height:       General: alert, cooperative, and no distress Lochia: appropriate Uterine Fundus: firm at U-1 Incisions: Healing well with no significant drainage DVT Evaluation: No evidence of DVT seen on physical exam. No cords or calf tenderness. No significant calf/ankle edema. Labs: Lab Results  Component Value Date   WBC  10.6 (H) 11/13/2022   HGB 8.2 (L) 11/13/2022   HCT 24.8 (L) 11/13/2022   MCV 87.3 11/13/2022   PLT 200 11/13/2022      Latest Ref Rng & Units 11/12/2022    6:21 PM  CMP  Glucose 70 - 99 mg/dL 122   BUN 6 - 20 mg/dL 8   Creatinine 0.44 - 1.00 mg/dL 0.71   Sodium 135 - 145 mmol/L 137   Potassium 3.5 - 5.1 mmol/L 4.2   Chloride 98 - 111 mmol/L 104   CO2 22 - 32 mmol/L 23   Calcium 8.9 - 10.3 mg/dL 8.9   Total Protein 6.5 - 8.1 g/dL 5.6   Total Bilirubin 0.3 - 1.2 mg/dL 0.5   Alkaline Phos 38 - 126 U/L 75   AST 15 - 41 U/L 55   ALT 0 - 44 U/L 22    Edinburgh Score:    11/10/2022   11:48 PM  Edinburgh Postnatal Depression Scale Screening Tool  I have been able to laugh and see the funny side of things. 1  I have looked forward with enjoyment to things. 0  I have blamed myself unnecessarily when things went wrong. 2  I have been anxious or worried for no good reason. 2  I have felt scared or panicky for no good reason. 1  Things have been getting on top of me. 2  I have been so unhappy that I have had difficulty sleeping. 1  I have felt sad or miserable. 1  I have been so unhappy that I have been crying. 0  The thought of harming myself has occurred to me. 0  Edinburgh Postnatal Depression Scale Total 10      After visit meds:  Allergies as of 11/13/2022       Reactions   Cefadroxil Nausea And Vomiting   Fire Ant Other (See Comments)   anaphylaxis anaphylaxis   Septra [sulfamethoxazole-trimethoprim] Nausea And Vomiting        Medication List     STOP taking these medications    citalopram 10 MG tablet Commonly known as: CELEXA   Syringe/Needle (Disp) 25G X 1-1/2" 5 ML Misc       TAKE these medications    cyanocobalamin 1000 MCG/ML injection Commonly known as: VITAMIN B12   ferrous sulfate 325 (65 FE) MG tablet Take 1 tablet (325 mg total) by mouth 2 (two) times daily with a meal.   ibuprofen 600 MG tablet Commonly known as: ADVIL Take 1 tablet (600  mg total) by mouth every 6 (six) hours as needed for mild pain or cramping.   oxyCODONE-acetaminophen 5-325 MG tablet Commonly known as: PERCOCET/ROXICET Take 1 tablet by mouth every 6 (six) hours as needed (breakthrough pain).   prenatal multivitamin Tabs tablet Take 1 tablet by mouth daily at 12 noon.               Discharge Care Instructions  (From admission, onward)           Start     Ordered  11/13/22 0000  Discharge wound care:       Comments: Perform wound care instructions   11/13/22 0801             Discharge home in stable condition Infant Feeding: Breast Infant Disposition:home with mother Discharge instruction: per After Visit Summary and Postpartum booklet. Activity: Advance as tolerated. Pelvic rest for 6 weeks.  Diet: routine diet Anticipated Birth Control: BTL done PP (salpingectomy ultimately performed) Postpartum Appointment:6 weeks Additional Postpartum F/U: Incision check 1 week, 2 week follow up for postpartum depression screen. Future Appointments: No future appointments.  Follow up Visit:  Follow-up Information     Will Bonnet, MD. Schedule an appointment as soon as possible for a visit in 2 week(s).   Specialty: Obstetrics and Gynecology Why: postpartum depression screen Contact information: [place] [address] [phone]         Will Bonnet, MD. Schedule an appointment as soon as possible for a visit in 1 week(s).   Specialty: Obstetrics and Gynecology Why: For incision check Contact information: [place] [address] [phone]                 SIGNED: Prentice Docker, MD, [facility] 11/13/2022 8:05 AM

## 2022-11-10 NOTE — Progress Notes (Signed)
Labor Check  Subj:  Complaints: starting to feel a little more discomfort with ctx, but tolerating well overall   Obj:  BP 119/86   Pulse 71   Temp 98 F (36.7 C) (Oral)   Ht '5\' 3"'$  (1.6 m)   Wt 64.4 kg   BMI 25.15 kg/m  Dose (milli-units/min) Oxytocin: 8 milli-units/min  Cervix: Dilation: 4.5 - 5 / Effacement (%): 70 / Station: 0, -1  Baseline FHR: 135 beats/min   Variability: moderate   Accelerations: present   Decelerations: absent Contractions: present frequency: 3 q 10 min Overall assessment: cat 1  Female chaperone present for pelvic exam:   A/P: 37 y.o. [id number] female at 71w3dwith elective IOL.  1.  Labor: continue to titrate pitocin as indicated per protocol  2.  FWB: reassuring, Overall assessment: category 1  3.  GBS neg  4.  Pain: as tolerated, epidural at any time if desires 5.  Recheck: 2 hours or prn, expect vaginal delivery  SPrentice Docker MD, [facility] 11/10/2022 12:25 PM

## 2022-11-10 NOTE — Progress Notes (Signed)
Labor Check  Subj:  Complaints: more painful ctx now. Feeling more pressure. Desires cvx cehck   Obj:  BP 131/82   Pulse 63   Temp 97.7 F (36.5 C) (Oral)   Ht '5\' 3"'$  (1.6 m)   Wt 64.4 kg   BMI 25.15 kg/m  Dose (milli-units/min) Oxytocin: 14 milli-units/min  Cervix: Dilation: 7 (cervix is elastic) / Effacement (%): 70 / Station: 0  Baseline FHR: 140 beats/min   Variability: moderate  Accelerations: present   Decelerations: present (earlies vs occasional variables, always short-lived with quick return to baseline). Contractions: present frequency: 3 q 10 min Overall assessment: cat 2 (but very reassuring overall)  Female chaperone present for pelvic exam:   A/P: 37 y.o. [id number] female at 25w3dwith elective IOL.  1.  Labor: continue pitocin per protocol.  Currently at 14  2.  FWB: reassuring, Overall assessment: category 2  3.  GBS negative  4.  Pain: per patient request.  Epidural ok if desired.  5.  Recheck: 2 hours or prn   SPrentice Docker MD, [facility] 11/10/2022 2:05 PM

## 2022-11-11 ENCOUNTER — Ambulatory Visit (HOSPITAL_COMMUNITY): Payer: BC Managed Care – PPO | Admitting: Licensed Clinical Social Worker

## 2022-11-11 ENCOUNTER — Other Ambulatory Visit: Payer: Self-pay

## 2022-11-11 ENCOUNTER — Encounter
Admission: EM | Disposition: A | Discharge status: Home / Self Care | Payer: Self-pay | Source: Home / Self Care | Attending: Obstetrics and Gynecology

## 2022-11-11 ENCOUNTER — Inpatient Hospital Stay: Payer: BC Managed Care – PPO

## 2022-11-11 ENCOUNTER — Encounter: Payer: Self-pay | Admitting: Obstetrics and Gynecology

## 2022-11-11 ENCOUNTER — Encounter (HOSPITAL_COMMUNITY): Payer: Self-pay

## 2022-11-11 ENCOUNTER — Inpatient Hospital Stay: Payer: BC Managed Care – PPO | Admitting: Anesthesiology

## 2022-11-11 HISTORY — PX: TUBAL LIGATION: SHX77

## 2022-11-11 LAB — CBC
HCT: 28.9 % — ABNORMAL LOW (ref 36.0–46.0)
HCT: 27.5 % — ABNORMAL LOW (ref 36.0–46.0)
HCT: 24.8 % — ABNORMAL LOW (ref 36.0–46.0)
Hemoglobin: 9.3 g/dL — ABNORMAL LOW (ref 12.0–15.0)
Hemoglobin: 8.6 g/dL — ABNORMAL LOW (ref 12.0–15.0)
Hemoglobin: 7.8 g/dL — ABNORMAL LOW (ref 12.0–15.0)
MCH: 28.2 pg (ref 26.0–34.0)
MCH: 28 pg (ref 26.0–34.0)
MCH: 28.3 pg (ref 26.0–34.0)
MCHC: 32.2 g/dL (ref 30.0–36.0)
MCHC: 31.3 g/dL (ref 30.0–36.0)
MCHC: 31.5 g/dL (ref 30.0–36.0)
MCV: 87.6 fL (ref 80.0–100.0)
MCV: 89.6 fL (ref 80.0–100.0)
MCV: 89.9 fL (ref 80.0–100.0)
Platelets: 205 10*3/uL (ref 150–400)
Platelets: 242 10*3/uL (ref 150–400)
Platelets: 261 10*3/uL (ref 150–400)
RBC: 3.3 MIL/uL — ABNORMAL LOW (ref 3.87–5.11)
RBC: 3.07 MIL/uL — ABNORMAL LOW (ref 3.87–5.11)
RBC: 2.76 MIL/uL — ABNORMAL LOW (ref 3.87–5.11)
RDW: 14.1 % (ref 11.5–15.5)
RDW: 14.5 % (ref 11.5–15.5)
RDW: 14.7 % (ref 11.5–15.5)
WBC: 10.5 10*3/uL (ref 4.0–10.5)
WBC: 13.1 10*3/uL — ABNORMAL HIGH (ref 4.0–10.5)
WBC: 17.8 10*3/uL — ABNORMAL HIGH (ref 4.0–10.5)
nRBC: 0 % (ref 0.0–0.2)
nRBC: 0 % (ref 0.0–0.2)
nRBC: 0 % (ref 0.0–0.2)

## 2022-11-11 LAB — COMPREHENSIVE METABOLIC PANEL
ALT: 20 U/L (ref 0–44)
AST: 59 U/L — ABNORMAL HIGH (ref 15–41)
Albumin: 2.4 g/dL — ABNORMAL LOW (ref 3.5–5.0)
Alkaline Phosphatase: 74 U/L (ref 38–126)
Anion gap: 8 (ref 5–15)
BUN: 8 mg/dL (ref 6–20)
CO2: 20 mmol/L — ABNORMAL LOW (ref 22–32)
Calcium: 7.8 mg/dL — ABNORMAL LOW (ref 8.9–10.3)
Chloride: 105 mmol/L (ref 98–111)
Creatinine, Ser: 0.58 mg/dL (ref 0.44–1.00)
GFR, Estimated: >60 mL/min (ref >60)
Glucose, Bld: 115 mg/dL — ABNORMAL HIGH (ref 70–99)
Potassium: 3.9 mmol/L (ref 3.5–5.1)
Sodium: 133 mmol/L — ABNORMAL LOW (ref 135–145)
Total Bilirubin: 0.7 mg/dL (ref 0.3–1.2)
Total Protein: 4.8 g/dL — ABNORMAL LOW (ref 6.5–8.1)

## 2022-11-11 LAB — PREPARE RBC (CROSSMATCH)

## 2022-11-11 SURGERY — LIGATION, FALLOPIAN TUBE, POSTPARTUM
Anesthesia: General

## 2022-11-11 MED ORDER — SUCCINYLCHOLINE CHLORIDE 200 MG/10ML IV SOSY
PREFILLED_SYRINGE | INTRAVENOUS | Status: DC | PRN
  Administered 2022-11-11: 100 mg via INTRAVENOUS

## 2022-11-11 MED ORDER — DEXAMETHASONE SODIUM PHOSPHATE 10 MG/ML IJ SOLN
INTRAMUSCULAR | Status: DC | PRN
  Administered 2022-11-11: 6 mg via INTRAVENOUS

## 2022-11-11 MED ORDER — ONDANSETRON HCL 4 MG/2ML IJ SOLN
INTRAMUSCULAR | Status: AC
  Filled 2022-11-11: qty 2

## 2022-11-11 MED ORDER — ACETAMINOPHEN 10 MG/ML IV SOLN
INTRAVENOUS | Status: AC
  Filled 2022-11-11: qty 100

## 2022-11-11 MED ORDER — ROCURONIUM BROMIDE 10 MG/ML (PF) SYRINGE
PREFILLED_SYRINGE | INTRAVENOUS | Status: AC
  Filled 2022-11-11: qty 10

## 2022-11-11 MED ORDER — MIDAZOLAM HCL 2 MG/2ML IJ SOLN
INTRAMUSCULAR | Status: AC
  Filled 2022-11-11: qty 2

## 2022-11-11 MED ORDER — FENTANYL CITRATE (PF) 100 MCG/2ML IJ SOLN
INTRAMUSCULAR | Status: DC | PRN
  Administered 2022-11-11 (×2): 50 ug via INTRAVENOUS

## 2022-11-11 MED ORDER — ONDANSETRON HCL 4 MG/2ML IJ SOLN
INTRAMUSCULAR | Status: DC | PRN
  Administered 2022-11-11: 4 mg via INTRAVENOUS

## 2022-11-11 MED ORDER — BUPIVACAINE HCL (PF) 0.5 % IJ SOLN
INTRAMUSCULAR | Status: DC | PRN
  Administered 2022-11-11: 2 mL

## 2022-11-11 MED ORDER — LORAZEPAM 2 MG/ML IJ SOLN
0.5000 mg | INTRAMUSCULAR | Status: DC | PRN
  Administered 2022-11-12: 0.5 mg via INTRAVENOUS
  Filled 2022-11-11: qty 1

## 2022-11-11 MED ORDER — HYDROCODONE-ACETAMINOPHEN 5-325 MG PO TABS
1.0000 | ORAL_TABLET | ORAL | Status: DC | PRN
  Administered 2022-11-11 (×3): 1 via ORAL
  Filled 2022-11-11 (×3): qty 1

## 2022-11-11 MED ORDER — ROCURONIUM BROMIDE 100 MG/10ML IV SOLN
INTRAVENOUS | Status: DC | PRN
  Administered 2022-11-11: 20 mg via INTRAVENOUS

## 2022-11-11 MED ORDER — FENTANYL CITRATE (PF) 100 MCG/2ML IJ SOLN
25.0000 ug | INTRAMUSCULAR | Status: DC | PRN
  Administered 2022-11-11 (×4): 25 ug via INTRAVENOUS

## 2022-11-11 MED ORDER — ONDANSETRON HCL 4 MG/2ML IJ SOLN
4.0000 mg | Freq: Once | INTRAMUSCULAR | Status: AC | PRN
  Administered 2022-11-11: 4 mg via INTRAVENOUS

## 2022-11-11 MED ORDER — LACTATED RINGERS IV BOLUS
1000.0000 mL | INTRAVENOUS | Status: AC
  Administered 2022-11-11: 1000 mL via INTRAVENOUS

## 2022-11-11 MED ORDER — DEXMEDETOMIDINE HCL IN NACL 80 MCG/20ML IV SOLN
INTRAVENOUS | Status: DC | PRN
  Administered 2022-11-11: 6 ug via BUCCAL

## 2022-11-11 MED ORDER — DEXAMETHASONE SODIUM PHOSPHATE 10 MG/ML IJ SOLN
INTRAMUSCULAR | Status: AC
  Filled 2022-11-11: qty 1

## 2022-11-11 MED ORDER — PROPOFOL 10 MG/ML IV BOLUS
INTRAVENOUS | Status: DC | PRN
  Administered 2022-11-11: 60 mg via INTRAVENOUS
  Administered 2022-11-11: 140 mg via INTRAVENOUS

## 2022-11-11 MED ORDER — LIDOCAINE HCL (PF) 2 % IJ SOLN
INTRAMUSCULAR | Status: AC
  Filled 2022-11-11: qty 5

## 2022-11-11 MED ORDER — FENTANYL CITRATE (PF) 100 MCG/2ML IJ SOLN
INTRAMUSCULAR | Status: AC
  Filled 2022-11-11: qty 2

## 2022-11-11 MED ORDER — MENTHOL 3 MG MT LOZG: 1.0000 | LOZENGE | OROMUCOSAL | Status: DC | PRN

## 2022-11-11 MED ORDER — LACTATED RINGERS IV BOLUS
1000.0000 mL | Freq: Once | INTRAVENOUS | Status: AC
  Administered 2022-11-11: 1000 mL via INTRAVENOUS

## 2022-11-11 MED ORDER — 0.9 % SODIUM CHLORIDE (POUR BTL) OPTIME
TOPICAL | Status: DC | PRN
  Administered 2022-11-11: 500 mL

## 2022-11-11 MED ORDER — SUGAMMADEX SODIUM 200 MG/2ML IV SOLN
INTRAVENOUS | Status: DC | PRN
  Administered 2022-11-11: 150 mg via INTRAVENOUS

## 2022-11-11 MED ORDER — LACTATED RINGERS IV SOLN: INTRAVENOUS | Status: DC | PRN

## 2022-11-11 MED ORDER — PROPOFOL 10 MG/ML IV BOLUS
INTRAVENOUS | Status: AC
  Filled 2022-11-11: qty 20

## 2022-11-11 MED ORDER — CYCLOBENZAPRINE HCL 5 MG PO TABS
5.0000 mg | ORAL_TABLET | Freq: Three times a day (TID) | ORAL | Status: DC | PRN
  Administered 2022-11-11: 5 mg via ORAL
  Filled 2022-11-11 (×2): qty 1

## 2022-11-11 MED ORDER — CYCLOBENZAPRINE HCL 10 MG PO TABS
10.0000 mg | ORAL_TABLET | Freq: Three times a day (TID) | ORAL | Status: DC | PRN
  Filled 2022-11-11: qty 1

## 2022-11-11 MED ORDER — ACETAMINOPHEN 10 MG/ML IV SOLN
INTRAVENOUS | Status: DC | PRN
  Administered 2022-11-11: 1000 mg via INTRAVENOUS

## 2022-11-11 MED ORDER — LACTATED RINGERS IV SOLN: INTRAVENOUS | Status: DC

## 2022-11-11 MED ORDER — SODIUM CHLORIDE 0.9% IV SOLUTION: Freq: Once | INTRAVENOUS | Status: AC

## 2022-11-11 MED ORDER — LORAZEPAM 1 MG PO TABS: 1.0000 mg | ORAL_TABLET | Freq: Four times a day (QID) | ORAL | Status: DC | PRN

## 2022-11-11 MED ORDER — LIDOCAINE HCL (CARDIAC) PF 100 MG/5ML IV SOSY
PREFILLED_SYRINGE | INTRAVENOUS | Status: DC | PRN
  Administered 2022-11-11: 100 mg via INTRAVENOUS

## 2022-11-11 MED ORDER — IOHEXOL 300 MG/ML  SOLN
100.0000 mL | Freq: Once | INTRAMUSCULAR | Status: AC | PRN
  Administered 2022-11-11: 100 mL via INTRAVENOUS

## 2022-11-11 SURGICAL SUPPLY — 35 items
BLADE SURG SZ11 CARB STEEL (BLADE) ×1 IMPLANT
CHLORAPREP W/TINT 26 (MISCELLANEOUS) ×1 IMPLANT
DERMABOND ADVANCED .7 DNX12 (GAUZE/BANDAGES/DRESSINGS) ×1 IMPLANT
DRAPE LAPAROTOMY 77X122 PED (DRAPES) ×1 IMPLANT
ELECT CAUTERY BLADE 6.4 (BLADE) ×1 IMPLANT
ELECT REM PT RETURN 9FT ADLT (ELECTROSURGICAL) ×1
ELECTRODE REM PT RTRN 9FT ADLT (ELECTROSURGICAL) ×1 IMPLANT
GAUZE 4X4 16PLY ~~LOC~~+RFID DBL (SPONGE) ×1 IMPLANT
GLOVE BIO SURGEON STRL SZ7 (GLOVE) ×1 IMPLANT
GLOVE SURG UNDER POLY LF SZ7.5 (GLOVE) ×1 IMPLANT
GOWN STRL REUS W/ TWL LRG LVL3 (GOWN DISPOSABLE) ×2 IMPLANT
GOWN STRL REUS W/ TWL XL LVL3 (GOWN DISPOSABLE) ×1 IMPLANT
GOWN STRL REUS W/TWL LRG LVL3 (GOWN DISPOSABLE) ×1
GOWN STRL REUS W/TWL XL LVL3 (GOWN DISPOSABLE) ×1
KIT TURNOVER CYSTO (KITS) ×1 IMPLANT
LABEL OR SOLS (LABEL) ×1 IMPLANT
MANIFOLD NEPTUNE II (INSTRUMENTS) ×1 IMPLANT
NEEDLE HYPO 22GX1.5 SAFETY (NEEDLE) ×1 IMPLANT
NS IRRIG 500ML POUR BTL (IV SOLUTION) ×1 IMPLANT
PACK BASIN MINOR ARMC (MISCELLANEOUS) ×1 IMPLANT
RETRACTOR RING XSMALL (MISCELLANEOUS) IMPLANT
RETRACTOR WOUND ALXS 18CM SML (MISCELLANEOUS) IMPLANT
RTRCTR WOUND ALEXIS 13CM XS SH (MISCELLANEOUS)
RTRCTR WOUND ALEXIS O 18CM SML (MISCELLANEOUS)
SCRUB CHG 4% DYNA-HEX 4OZ (MISCELLANEOUS) ×1 IMPLANT
SUT MNCRL 4-0 (SUTURE) ×1
SUT MNCRL 4-0 27XMFL (SUTURE) ×1
SUT PLAIN GUT 0 (SUTURE) ×2 IMPLANT
SUT VIC AB 2-0 UR6 27 (SUTURE) ×1 IMPLANT
SUT VIC AB 3-0 SH 27 (SUTURE) ×1
SUT VIC AB 3-0 SH 27X BRD (SUTURE) ×1 IMPLANT
SUTURE MNCRL 4-0 27XMF (SUTURE) ×1 IMPLANT
SYR 10ML LL (SYRINGE) ×1 IMPLANT
TRAP FLUID SMOKE EVACUATOR (MISCELLANEOUS) ×1 IMPLANT
WATER STERILE IRR 500ML POUR (IV SOLUTION) ×1 IMPLANT

## 2022-11-11 NOTE — Progress Notes (Signed)
Discussed my initial read of CT scan with patient. The CT scan does indicate much more fluid in the abdomen than I would expect from a postpartum tubal ligation and vaginal delivery.  She has a falling hemoglobin starting at 9.3 this morning, dropping to 8.6 after surgery today, and going down to 7.8 this evening about 8 hours after surgery. We also discussed that she has a lot of gas in her bowels and I am still awaiting the read from the radiologist for further comment on any other findings (and confirmation or not of my own read).  She has been symptomatic several times throughout the day with hypotension and tachycardia.  She is also having quite a bit of abdominal and right rib pain.    I discussed with her my concern for an ongoing bleed that could be addressed in one of a couple of ways.  She is clearly anemic and symptomatic and continues to bleed, albeit she has demonstrated a fairly slow bleed up until this point dropping her hemoglobin about 1.5 since the surgery over about 8 hours.  Because I believe she will continue to drop her blood counts as the bleeding continues, I strongly recommended a blood transfusion. I personally reviewed both the risks and benefits of receiving a blood transfusion.  She has previously signed a blood transfusion consent stating that she would accept blood products. This was signed yesterday during this hospital admission.  I offered her two options. The first would be to give her 2 units of blood in overnight and return to the OR for an attempted laparoscopic repair of what is likely the left side of her uterus bleeding where she had the tubal ligation today.  The risks were again reviewed with her of this type of surgery that would likely require some great care to avoid her recently postpartum uterus.  The uterus would require some manipulation and so multiple ports would likely be necessary, as well as a Physicist, medical. I may chose to use a robotic approach in order  to gain better visualization and control.  This will have to be determine once I'm in the OR. Once her bleed was repaired, her abdomen would be evacuated of blood and this would also help with her current symptoms. We discussed that it is also possible that I would need to convert the case to a laparotomy, likely a Pfannenstiel incision.  There is also a remote possibility of a hysterectomy in this case, if I"m unable to get the bleeding under control given the very large vascularity associated with an immediately post-partum uterus.   The second option was to continue to give her blood products and try to tamponade the bleeding. This would likely require a greater amount of blood products (transfusion) and a longer hospital stay without a guarantee that this would definitely tamponade her bleeding. After discussing the risks and benefits of both approaches, she elects for surgery. She has had two units of blood ordered to be given overnight. I have already called the add-on line to post her case for tomorrow and pre-operative orders have been placed. She has been NPO since 6:30 PM tonight.    All questions answered. She understands that if any acute changes occur overnight, we'll have to proceed emergently to the OR overnight.    Prentice Docker, MD, [facility] 11/11/2022 11:33 PM

## 2022-11-11 NOTE — Progress Notes (Signed)
Pt to OR via bed.

## 2022-11-11 NOTE — Progress Notes (Signed)
Pt off unit to CT scan via wc accompanied by CWright, RN and 2 orderly's. Approx 10 mins left of 1000cc bolus. RN to change rate to 125 when appropriate.

## 2022-11-11 NOTE — Transfer of Care (Signed)
Immediate Anesthesia Transfer of Care Note  Patient: Chelsea Duncan  Procedure(s) Performed: POST PARTUM TUBAL LIGATION  Patient Location: PACU  Anesthesia Type:General  Level of Consciousness: awake, alert , and oriented  Airway & Oxygen Therapy: Patient Spontanous Breathing and Patient connected to face mask oxygen  Post-op Assessment: Report given to RN and Post -op Vital signs reviewed and stable  Post vital signs: Reviewed and stable  Last Vitals:  Vitals Value Taken Time  BP 112/69 11/11/22 1351  Temp    Pulse 89 11/11/22 1353  Resp 12 11/11/22 1353  SpO2 97 % 11/11/22 1353  Vitals shown include unvalidated device data.  Last Pain:  Vitals:   11/11/22 1207  TempSrc: Temporal  PainSc: 0-No pain         Complications: No notable events documented.

## 2022-11-11 NOTE — Anesthesia Preprocedure Evaluation (Signed)
Anesthesia Evaluation  Patient identified by MRN, date of birth, ID band Patient awake    Reviewed: Allergy & Precautions, NPO status , Patient's Chart, lab work & pertinent test results  History of Anesthesia Complications (+) PONV and history of anesthetic complications  Airway Mallampati: II  TM Distance: >3 FB Neck ROM: Full    Dental  (+) Teeth Intact   Pulmonary neg pulmonary ROS   Pulmonary exam normal breath sounds clear to auscultation       Cardiovascular Exercise Tolerance: Good negative cardio ROS Normal cardiovascular exam Rhythm:Regular Rate:Normal     Neuro/Psych   Anxiety Depression    negative neurological ROS  negative psych ROS   GI/Hepatic negative GI ROS, Neg liver ROS,,,  Endo/Other  negative endocrine ROS    Renal/GU negative Renal ROS  negative genitourinary   Musculoskeletal negative musculoskeletal ROS (+)    Abdominal   Peds negative pediatric ROS (+)  Hematology negative hematology ROS (+)   Anesthesia Other Findings Past Medical History: No date: Allergy No date: Anxiety No date: Bone tumor (benign)     Comment:  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  03/2016: BRCA negative     Comment:  MyRisk neg except NBN VUS No date: Chicken pox No date: Complication of anesthesia No date: COVID-19     Comment:  12/2019 08/29/2020: Depression during pregnancy 03/2016: Family history of breast cancer     Comment:  IBIS=18.4%  She has been tested for at least BRCA and               was negative (~2017). Tyrer-Cuzick (lifetime) was ~18%.  No date: PONV (postoperative nausea and vomiting) No date: Pyelonephritis No date: S/P skin biopsy     Comment:  neck normal  No date: UTI (urinary tract infection)  Past Surgical History: No date: BONE CYST EXCISION     Comment:  right foot.  BMI    Body Mass Index: 25.15 kg/m      Reproductive/Obstetrics negative OB ROS                              Anesthesia Physical Anesthesia Plan  ASA: 1  Anesthesia Plan: General   Post-op Pain Management:    Induction: Intravenous  PONV Risk Score and Plan: 1 and Ondansetron and Dexamethasone  Airway Management Planned: Oral ETT  Additional Equipment:   Intra-op Plan:   Post-operative Plan: Extubation in OR  Informed Consent: I have reviewed the patients History and Physical, chart, labs and discussed the procedure including the risks, benefits and alternatives for the proposed anesthesia with the patient or authorized representative who has indicated his/her understanding and acceptance.     Dental Advisory Given  Plan Discussed with: CRNA and Surgeon  Anesthesia Plan Comments:        Anesthesia Quick Evaluation

## 2022-11-11 NOTE — Progress Notes (Signed)
Postop progress note:  Subjective: patient had pp BTL today, uncomplicated. She returned to the floor in significant pain and had BPs 73/41, 83/58, then 95/55.  She was also nauseated.  She was given zofran, 1 liter bolus of LR.  She was able to tolerate pain medication at that point. She since has had Norco and states she feels much better than she did before.   She is belching, no flatus.  She has tolerated water, lemon-lime, chicken broth, saltines, peanut butter. Now she is getting apple juice. She has tolerated these well.  Objective: BP (!) 95/55 (BP Location: Right Arm)   Pulse 85   Temp (!) 97.5 F (36.4 C) (Axillary)   Resp 20   Ht '5\' 3"'$  (1.6 m)   Wt 64.4 kg   SpO2 96%   Breastfeeding Unknown   BMI 25.15 kg/m   Gen: NAD CV: RRR Pulm: CTAB Abd: mildly distended, s/mildly ttp/+BS  Incision: c/d/I  CBC Lab Results  Component Value Date   HGB 8.6 (L) 11/11/2022   HGB 9.3 (L) 11/11/2022   HGB 11.4 (L) 11/10/2022   HCT 27.5 (L) 11/11/2022       A/P: POD#0 from BTL, initially with hypotension and nausea, improving.  - keep overnight - repeat CBC in AM - out of bed with assistance only. - monitor UOP. - low threshold to scan with CT, if pain worsens or repeat hypotensive episodes. IV fluid bolus as indicated. - all questions answered around surgery.  Prentice Docker, MD, [facility] 11/11/2022 5:35 PM

## 2022-11-11 NOTE — Op Note (Signed)
Op Note Postpartum Tubal Ligation  Pre-Op Diagnosis: multiparity, desires permanent sterilization  Post-Op Diagnosis: multiparity, desires permanent sterilization  Procedures:  Postpartum tubal ligation via Pomeroy method  Primary Surgeon: Prentice Docker, MD  EBL: 10 ml   IVF: 800 mL   Specimens: portion of right and left fallopian tubes  Drains: None  Complications: None   Disposition: PACU   Condition: Stable   Findings: normal appearing bilateral fallopian tubes  Indication: The patient is a 37 y.o. [id number] who is postpartum day 1 status post spontaneous vaginal delivery.  She has been counseled extensively regarding risks, benefits, and alternatives to tubal ligation, including non-permanent forms of contraception that are equivalent in efficacy with potentially better side effects.  She has been advised that there is a failure rate of 3-5 in every 1,000 tubal ligations per year with an increased risk of ectopic pregnancy should pregnancy occur.   Procedure Summary:  The patient was taken to the operating room where general anesthesia was administered and found to be adequate. After timeout was called a small transverse, infraumbilical incision was made with the scalpel. The incision was carried down through the fascia until the peritoneum was identified and entered. The peritoneum was noted to be free of any adhesions and the incision was then extended.  The patient's left fallopian tube was identified, brought incision, and grasped with a Babcock clamp. The tube was then followed out to the fimbria. The Babcock clamp was then used to grasp the tube approximately 4 cm from the cornual region. A 3 cm segment of tube was then ligated with the 2 free ties of plain gut, and excised. Good hemostasis was noted and the tube was returned to the abdomen. The left fallopian tube was then ligated, and a 3 cm segment excised in a similar fashion. Excellent hemostasis was noted, and the  tube returned to the abdomen.  The peritoneum and fascia were closed in a single layer using 2-0 Vicryl. The skin was closed in a subcuticular fashion using 4-0 vicryl, undyed. The closure was also closed with Dermabond.  Sponge, lap, needle, and instrument counts were correct x 2.  VTE prophylaxis: SCDs. Antibiotic prophylaxis: none indicated. The patient tolerated the procedure well and was taken to the PACU in stable condition.   Prentice Docker, MD, [facility] 11/11/2022 1:43 PM

## 2022-11-11 NOTE — Progress Notes (Signed)
Called by RN for continued pain and worsening, described as in her ribs.  She is now hypotensive again and tachycardic.  Since she has had surgery today, will order stat cbc, CT abd/pelvis, and give IVF LR bolus, 1 liter to start with.  Continue to monitor closely.

## 2022-11-11 NOTE — Progress Notes (Signed)
Obstetric Postpartum Daily Progress Note  Chief Complaint: Postpartum care after vaginal delivery  Subjective:  37 y.o. [id number] postpartum day #1 status post vaginal delivery.  She is ambulating, is tolerating po (though NPO since midnight), is voiding spontaneously.  Her pain is well controlled on PO pain medications. Her lochia is less than menses.   Medications SCHEDULED MEDICATIONS   ferrous sulfate  325 mg Oral BID WC   prenatal multivitamin  1 tablet Oral Q1200   senna-docusate  2 tablet Oral Q24H    MEDICATION INFUSIONS   dextrose 5 % and 0.9% NaCl 125 mL/hr at 11/11/22 0706    PRN MEDICATIONS  acetaminophen, benzocaine-Menthol, coconut oil, witch hazel-glycerin **AND** dibucaine, diphenhydrAMINE, HYDROcodone-acetaminophen, ibuprofen, ondansetron **OR** ondansetron (ZOFRAN) IV, simethicone    Objective:   Vitals:   11/10/22 1842 11/10/22 2045 11/10/22 2347 11/11/22 0433  BP: 114/65 117/69 121/78 112/79  Pulse: 77 75 66 60  Resp:  '20 20 20  '$ Temp: 98.6 F (37 C) 98.1 F (36.7 C) 97.6 F (36.4 C) 98 F (36.7 C)  TempSrc: Oral Oral Axillary Oral  SpO2:  98% 100% 99%  Weight:      Height:        Current Vital Signs 24h Vital Sign Ranges  T 98 F (36.7 C) Temp  Avg: 98 F (36.7 C)  Min: 97.3 F (36.3 C)  Max: 98.6 F (37 C)  BP 112/79 BP  Min: 112/79  Max: 131/82  HR 60 Pulse  Avg: 71.2  Min: 60  Max: 92  RR 20 Resp  Avg: 20  Min: 20  Max: 20  SaO2 99 % (R/A) Room Air SpO2  Avg: 99.5 %  Min: 98 %  Max: 100 %       24 Hour I/O Current Shift I/O  Time Ins Outs 02/04 0701 - 02/05 0700 In: 120 [P.O.:120] Out: 550 [Urine:450] 02/05 0701 - 02/05 1900 In: 1613.8 [I.V.:1613.8] Out: -   General: NAD Pulmonary: no increased work of breathing Abdomen: non-distended, non-tender, fundus firm at level of umbilicus Extremities: no edema, no erythema, no tenderness  Labs:  Recent Labs  Lab 11/10/22 0854 11/11/22 0614  WBC 8.0 10.5  HGB 11.4* 9.3*  HCT 34.7* 28.9*   PLT 263 205     Assessment:   37 y.o. [id number] postpartum day # 1 status post NSVD at term. Desires permanent sterilization.   Plan:   1) Acute blood loss anemia - hemodynamically stable and asymptomatic - po ferrous sulfate  2) A POS /  Information for the patient's newborn:  Hector, Venne [[id number]]    / Rubella Immune (07/26 0000)/ Varicella Immune  3) TDAP status uptodate  4) breast /Contraception = plan for BTL today  37 y.o. [id number]  with undesired fertility, desires permanent sterilization.  Other reversible forms of contraception were discussed with patient; she declines all other modalities. Permanent nature of as well as associated risks of the procedure discussed with patient including but not limited to: risk of regret, permanence of method, bleeding, infection, injury to surrounding organs and need for additional procedures.  Failure risk of 0.5-1% with increased risk of ectopic gestation if pregnancy occurs was also discussed with patient.     5) Disposition: home today or tomorrow.   Prentice Docker, MD 11/11/2022 7:43 AM

## 2022-11-11 NOTE — Anesthesia Postprocedure Evaluation (Signed)
Anesthesia Post Note  Patient: Chelsea Duncan  Procedure(s) Performed: POST PARTUM TUBAL LIGATION  Patient location during evaluation: PACU Anesthesia Type: General Level of consciousness: awake and oriented Pain management: satisfactory to patient Vital Signs Assessment: post-procedure vital signs reviewed and stable Respiratory status: spontaneous breathing and nonlabored ventilation Cardiovascular status: stable Anesthetic complications: no  No notable events documented.   Last Vitals:  Vitals:   11/11/22 1035 11/11/22 1207  BP: 116/69 124/78  Pulse:  77  Resp:  16  Temp:  37.1 C  SpO2:      Last Pain:  Vitals:   11/11/22 1207  TempSrc: Temporal  PainSc: 0-No pain                 VAN STAVEREN,Miliana Gangwer

## 2022-11-11 NOTE — Progress Notes (Signed)
Pt back from CT- prefers to sit upright in wheelchair at this time. Visitors at bedside.

## 2022-11-11 NOTE — Anesthesia Procedure Notes (Signed)
Procedure Name: Intubation Date/Time: 11/11/2022 12:50 PM  Performed by: Aline Brochure, CRNAPre-anesthesia Checklist: Patient identified, Patient being monitored, Timeout performed, Emergency Drugs available and Suction available Patient Re-evaluated:Patient Re-evaluated prior to induction Oxygen Delivery Method: Circle system utilized Preoxygenation: Pre-oxygenation with 100% oxygen Induction Type: IV induction and Rapid sequence Laryngoscope Size: 3 and McGraph Grade View: Grade I Tube type: Oral Tube size: 6.5 mm Number of attempts: 1 Airway Equipment and Method: Stylet and Video-laryngoscopy Placement Confirmation: ETT inserted through vocal cords under direct vision, positive ETCO2 and breath sounds checked- equal and bilateral Secured at: 20 cm Tube secured with: Tape Dental Injury: Teeth and Oropharynx as per pre-operative assessment

## 2022-11-12 ENCOUNTER — Encounter: Payer: Self-pay | Admitting: Obstetrics and Gynecology

## 2022-11-12 ENCOUNTER — Inpatient Hospital Stay: Payer: BC Managed Care – PPO | Admitting: Registered Nurse

## 2022-11-12 ENCOUNTER — Telehealth: Payer: Self-pay | Admitting: Psychiatry

## 2022-11-12 ENCOUNTER — Encounter
Admission: EM | Disposition: A | Discharge status: Home / Self Care | Payer: Self-pay | Source: Home / Self Care | Attending: Obstetrics and Gynecology

## 2022-11-12 DIAGNOSIS — R58 Hemorrhage, not elsewhere classified: Secondary | ICD-10-CM | POA: Diagnosis not present

## 2022-11-12 HISTORY — PX: TOTAL LAPAROSCOPIC HYSTERECTOMY WITH BILATERAL SALPINGO OOPHORECTOMY: SHX6845

## 2022-11-12 HISTORY — PX: LAPAROSCOPY: SHX197

## 2022-11-12 LAB — COMPREHENSIVE METABOLIC PANEL
ALT: 22 U/L (ref 0–44)
AST: 55 U/L — ABNORMAL HIGH (ref 15–41)
Albumin: 2.6 g/dL — ABNORMAL LOW (ref 3.5–5.0)
Alkaline Phosphatase: 75 U/L (ref 38–126)
Anion gap: 10 (ref 5–15)
BUN: 8 mg/dL (ref 6–20)
CO2: 23 mmol/L (ref 22–32)
Calcium: 8.9 mg/dL (ref 8.9–10.3)
Chloride: 104 mmol/L (ref 98–111)
Creatinine, Ser: 0.71 mg/dL (ref 0.44–1.00)
GFR, Estimated: >60 mL/min (ref >60)
Glucose, Bld: 122 mg/dL — ABNORMAL HIGH (ref 70–99)
Potassium: 4.2 mmol/L (ref 3.5–5.1)
Sodium: 137 mmol/L (ref 135–145)
Total Bilirubin: 0.5 mg/dL (ref 0.3–1.2)
Total Protein: 5.6 g/dL — ABNORMAL LOW (ref 6.5–8.1)

## 2022-11-12 LAB — SURGICAL PATHOLOGY

## 2022-11-12 LAB — CBC
HCT: 29.6 % — ABNORMAL LOW (ref 36.0–46.0)
HCT: 30.2 % — ABNORMAL LOW (ref 36.0–46.0)
Hemoglobin: 9.9 g/dL — ABNORMAL LOW (ref 12.0–15.0)
Hemoglobin: 10 g/dL — ABNORMAL LOW (ref 12.0–15.0)
MCH: 28.9 pg (ref 26.0–34.0)
MCH: 28.8 pg (ref 26.0–34.0)
MCHC: 33.4 g/dL (ref 30.0–36.0)
MCHC: 33.1 g/dL (ref 30.0–36.0)
MCV: 86.5 fL (ref 80.0–100.0)
MCV: 87 fL (ref 80.0–100.0)
Platelets: 225 10*3/uL (ref 150–400)
Platelets: 234 10*3/uL (ref 150–400)
RBC: 3.42 MIL/uL — ABNORMAL LOW (ref 3.87–5.11)
RBC: 3.47 MIL/uL — ABNORMAL LOW (ref 3.87–5.11)
RDW: 14.1 % (ref 11.5–15.5)
RDW: 14.5 % (ref 11.5–15.5)
WBC: 12.9 10*3/uL — ABNORMAL HIGH (ref 4.0–10.5)
WBC: 12.8 10*3/uL — ABNORMAL HIGH (ref 4.0–10.5)
nRBC: 0 % (ref 0.0–0.2)
nRBC: 0 % (ref 0.0–0.2)

## 2022-11-12 SURGERY — LAPAROSCOPY, DIAGNOSTIC
Anesthesia: General

## 2022-11-12 MED ORDER — CEFAZOLIN SODIUM 1 G IJ SOLR
INTRAMUSCULAR | Status: AC
  Filled 2022-11-12: qty 10

## 2022-11-12 MED ORDER — OXYCODONE HCL 5 MG PO TABS: 5.0000 mg | ORAL_TABLET | Freq: Once | ORAL | Status: AC

## 2022-11-12 MED ORDER — ONDANSETRON HCL 4 MG/2ML IJ SOLN
INTRAMUSCULAR | Status: AC
  Filled 2022-11-12: qty 2

## 2022-11-12 MED ORDER — DEXMEDETOMIDINE HCL IN NACL 80 MCG/20ML IV SOLN
INTRAVENOUS | Status: AC
  Filled 2022-11-12: qty 20

## 2022-11-12 MED ORDER — HEMOSTATIC AGENTS (NO CHARGE) OPTIME
TOPICAL | Status: DC | PRN
  Administered 2022-11-12: 1 via TOPICAL

## 2022-11-12 MED ORDER — FENTANYL CITRATE (PF) 100 MCG/2ML IJ SOLN
INTRAMUSCULAR | Status: AC
  Filled 2022-11-12: qty 2

## 2022-11-12 MED ORDER — SODIUM CHLORIDE 0.9% IV SOLUTION: Freq: Once | INTRAVENOUS | Status: AC

## 2022-11-12 MED ORDER — PROPOFOL 10 MG/ML IV BOLUS
INTRAVENOUS | Status: AC
  Filled 2022-11-12: qty 20

## 2022-11-12 MED ORDER — MENTHOL 3 MG MT LOZG: 1.0000 | LOZENGE | OROMUCOSAL | Status: DC | PRN

## 2022-11-12 MED ORDER — LIDOCAINE HCL (CARDIAC) PF 100 MG/5ML IV SOSY
PREFILLED_SYRINGE | INTRAVENOUS | Status: DC | PRN
  Administered 2022-11-12: 80 mg via INTRAVENOUS

## 2022-11-12 MED ORDER — DEXAMETHASONE SODIUM PHOSPHATE 10 MG/ML IJ SOLN
INTRAMUSCULAR | Status: DC | PRN
  Administered 2022-11-12: 10 mg via INTRAVENOUS

## 2022-11-12 MED ORDER — DOCUSATE SODIUM 100 MG PO CAPS
100.0000 mg | ORAL_CAPSULE | Freq: Two times a day (BID) | ORAL | Status: DC
  Administered 2022-11-12: 100 mg via ORAL
  Filled 2022-11-12 (×2): qty 1

## 2022-11-12 MED ORDER — OXYCODONE HCL 5 MG PO TABS
10.0000 mg | ORAL_TABLET | ORAL | Status: DC | PRN
  Administered 2022-11-12 (×2): 10 mg via ORAL
  Filled 2022-11-12 (×2): qty 2

## 2022-11-12 MED ORDER — ROCURONIUM BROMIDE 100 MG/10ML IV SOLN
INTRAVENOUS | Status: DC | PRN
  Administered 2022-11-12: 40 mg via INTRAVENOUS
  Administered 2022-11-12 (×2): 10 mg via INTRAVENOUS

## 2022-11-12 MED ORDER — CEFAZOLIN SODIUM-DEXTROSE 2-3 GM-%(50ML) IV SOLR
INTRAVENOUS | Status: DC | PRN
  Administered 2022-11-12: 2 g via INTRAVENOUS

## 2022-11-12 MED ORDER — PROPOFOL 10 MG/ML IV BOLUS
INTRAVENOUS | Status: DC | PRN
  Administered 2022-11-12: 120 mg via INTRAVENOUS
  Administered 2022-11-12: 30 mg via INTRAVENOUS

## 2022-11-12 MED ORDER — DEXMEDETOMIDINE HCL IN NACL 200 MCG/50ML IV SOLN
INTRAVENOUS | Status: DC | PRN
  Administered 2022-11-12 (×2): 8 ug via INTRAVENOUS
  Administered 2022-11-12: 4 ug via INTRAVENOUS
  Administered 2022-11-12: 8 ug via INTRAVENOUS

## 2022-11-12 MED ORDER — FENTANYL CITRATE (PF) 100 MCG/2ML IJ SOLN: 25.0000 ug | INTRAMUSCULAR | Status: DC | PRN

## 2022-11-12 MED ORDER — DEXAMETHASONE SODIUM PHOSPHATE 10 MG/ML IJ SOLN
INTRAMUSCULAR | Status: AC
  Filled 2022-11-12: qty 1

## 2022-11-12 MED ORDER — MIDAZOLAM HCL 2 MG/2ML IJ SOLN
INTRAMUSCULAR | Status: AC
  Filled 2022-11-12: qty 2

## 2022-11-12 MED ORDER — ONDANSETRON HCL 4 MG/2ML IJ SOLN
INTRAMUSCULAR | Status: DC | PRN
  Administered 2022-11-12: 4 mg via INTRAVENOUS

## 2022-11-12 MED ORDER — OXYCODONE HCL 5 MG PO TABS
ORAL_TABLET | ORAL | Status: AC
  Administered 2022-11-12: 5 mg via ORAL
  Filled 2022-11-12: qty 1

## 2022-11-12 MED ORDER — POVIDONE-IODINE 10 % EX SWAB: 2.0000 | Freq: Once | CUTANEOUS | Status: DC

## 2022-11-12 MED ORDER — LACTATED RINGERS IV SOLN: INTRAVENOUS | Status: DC

## 2022-11-12 MED ORDER — SUCCINYLCHOLINE CHLORIDE 200 MG/10ML IV SOSY
PREFILLED_SYRINGE | INTRAVENOUS | Status: DC | PRN
  Administered 2022-11-12: 140 mg via INTRAVENOUS

## 2022-11-12 MED ORDER — LIDOCAINE HCL (PF) 2 % IJ SOLN
INTRAMUSCULAR | Status: AC
  Filled 2022-11-12: qty 5

## 2022-11-12 MED ORDER — MIDAZOLAM HCL 2 MG/2ML IJ SOLN
INTRAMUSCULAR | Status: DC | PRN
  Administered 2022-11-12 (×2): 1 mg via INTRAVENOUS

## 2022-11-12 MED ORDER — SUCCINYLCHOLINE CHLORIDE 200 MG/10ML IV SOSY
PREFILLED_SYRINGE | INTRAVENOUS | Status: AC
  Filled 2022-11-12: qty 10

## 2022-11-12 MED ORDER — OXYCODONE-ACETAMINOPHEN 5-325 MG PO TABS
2.0000 | ORAL_TABLET | Freq: Four times a day (QID) | ORAL | Status: DC | PRN
  Administered 2022-11-13: 2 via ORAL
  Filled 2022-11-12: qty 2

## 2022-11-12 MED ORDER — SODIUM CHLORIDE (PF) 0.9 % IJ SOLN
INTRAMUSCULAR | Status: AC
  Filled 2022-11-12: qty 10

## 2022-11-12 MED ORDER — ONDANSETRON HCL 4 MG PO TABS: 4.0000 mg | ORAL_TABLET | Freq: Four times a day (QID) | ORAL | Status: DC | PRN

## 2022-11-12 MED ORDER — BUPIVACAINE LIPOSOME 1.3 % IJ SUSP
INTRAMUSCULAR | Status: DC | PRN
  Administered 2022-11-12: 15 mL

## 2022-11-12 MED ORDER — ACETAMINOPHEN 10 MG/ML IV SOLN
INTRAVENOUS | Status: AC
  Filled 2022-11-12: qty 100

## 2022-11-12 MED ORDER — PROPOFOL 10 MG/ML IV BOLUS
INTRAVENOUS | Status: AC
  Filled 2022-11-12: qty 40

## 2022-11-12 MED ORDER — OXYCODONE-ACETAMINOPHEN 5-325 MG PO TABS
1.0000 | ORAL_TABLET | Freq: Four times a day (QID) | ORAL | Status: DC | PRN
  Administered 2022-11-12: 1 via ORAL
  Filled 2022-11-12: qty 1

## 2022-11-12 MED ORDER — ONDANSETRON HCL 4 MG/2ML IJ SOLN: 4.0000 mg | Freq: Once | INTRAMUSCULAR | Status: DC | PRN

## 2022-11-12 MED ORDER — 0.9 % SODIUM CHLORIDE (POUR BTL) OPTIME
TOPICAL | Status: DC | PRN
  Administered 2022-11-12: 150 mL

## 2022-11-12 MED ORDER — ACETAMINOPHEN 10 MG/ML IV SOLN
INTRAVENOUS | Status: DC | PRN
  Administered 2022-11-12: 1000 mg via INTRAVENOUS

## 2022-11-12 MED ORDER — PROPOFOL 1000 MG/100ML IV EMUL
INTRAVENOUS | Status: AC
  Filled 2022-11-12: qty 100

## 2022-11-12 MED ORDER — OXYCODONE HCL 5 MG PO TABS: 5.0000 mg | ORAL_TABLET | ORAL | Status: DC | PRN

## 2022-11-12 MED ORDER — LORAZEPAM 1 MG PO TABS
1.0000 mg | ORAL_TABLET | Freq: Four times a day (QID) | ORAL | Status: DC | PRN
  Administered 2022-11-12: 1 mg via ORAL
  Filled 2022-11-12: qty 1

## 2022-11-12 MED ORDER — IBUPROFEN 600 MG PO TABS
600.0000 mg | ORAL_TABLET | Freq: Four times a day (QID) | ORAL | Status: DC | PRN
  Administered 2022-11-12 – 2022-11-13 (×3): 600 mg via ORAL
  Filled 2022-11-12 (×3): qty 1

## 2022-11-12 MED ORDER — SUGAMMADEX SODIUM 200 MG/2ML IV SOLN
INTRAVENOUS | Status: DC | PRN
  Administered 2022-11-12: 128.8 mg via INTRAVENOUS

## 2022-11-12 MED ORDER — ONDANSETRON HCL 4 MG/2ML IJ SOLN: 4.0000 mg | Freq: Four times a day (QID) | INTRAMUSCULAR | Status: DC | PRN

## 2022-11-12 MED ORDER — BUPIVACAINE LIPOSOME 1.3 % IJ SUSP
INTRAMUSCULAR | Status: AC
  Filled 2022-11-12: qty 20

## 2022-11-12 MED ORDER — SODIUM CHLORIDE 0.9 % IR SOLN
Status: DC | PRN
  Administered 2022-11-12: 200 mL

## 2022-11-12 MED ORDER — PROPOFOL 500 MG/50ML IV EMUL
INTRAVENOUS | Status: DC | PRN
  Administered 2022-11-12: 15 ug/kg/min via INTRAVENOUS

## 2022-11-12 MED ORDER — SIMETHICONE 80 MG PO CHEW
80.0000 mg | CHEWABLE_TABLET | Freq: Four times a day (QID) | ORAL | Status: DC | PRN
  Administered 2022-11-12: 80 mg via ORAL
  Filled 2022-11-12: qty 1

## 2022-11-12 MED ORDER — LACTATED RINGERS IV SOLN: INTRAVENOUS | Status: DC | PRN

## 2022-11-12 MED ORDER — FENTANYL CITRATE (PF) 100 MCG/2ML IJ SOLN
INTRAMUSCULAR | Status: DC | PRN
  Administered 2022-11-12: 50 ug via INTRAVENOUS
  Administered 2022-11-12 (×2): 25 ug via INTRAVENOUS

## 2022-11-12 MED ORDER — ROCURONIUM BROMIDE 10 MG/ML (PF) SYRINGE
PREFILLED_SYRINGE | INTRAVENOUS | Status: AC
  Filled 2022-11-12: qty 10

## 2022-11-12 MED ORDER — PHENYLEPHRINE 80 MCG/ML (10ML) SYRINGE FOR IV PUSH (FOR BLOOD PRESSURE SUPPORT)
PREFILLED_SYRINGE | INTRAVENOUS | Status: AC
  Filled 2022-11-12: qty 10

## 2022-11-12 SURGICAL SUPPLY — 41 items
APPLICATOR ARISTA FLEXITIP XL (MISCELLANEOUS) IMPLANT
BLADE SURG SZ11 CARB STEEL (BLADE) ×1 IMPLANT
COUNTER NEEDLE 20/40 LG (NEEDLE) IMPLANT
DERMABOND ADVANCED .7 DNX12 (GAUZE/BANDAGES/DRESSINGS) ×1 IMPLANT
DRAPE 3/4 80X56 (DRAPES) ×1 IMPLANT
DRAPE ROBOT W/ LEGGING 30X125 (DRAPES) IMPLANT
DRAPE UNDER BUTTOCK W/FLU (DRAPES) ×1 IMPLANT
GLOVE BIO SURGEON STRL SZ7 (GLOVE) ×6 IMPLANT
GLOVE SURG UNDER LTX SZ7.5 (GLOVE) ×2 IMPLANT
GOWN STRL REUS W/ TWL LRG LVL3 (GOWN DISPOSABLE) ×6 IMPLANT
GOWN STRL REUS W/TWL LRG LVL3 (GOWN DISPOSABLE) ×4
GRASPER SUT TROCAR 14GX15 (MISCELLANEOUS) ×1 IMPLANT
HEMOSTAT ARISTA ABSORB 3G PWDR (HEMOSTASIS) IMPLANT
IRRIGATION STRYKERFLOW (MISCELLANEOUS) IMPLANT
IRRIGATOR STRYKERFLOW (MISCELLANEOUS) ×1
IV NS IRRIG 3000ML ARTHROMATIC (IV SOLUTION) IMPLANT
KIT PINK PAD W/HEAD ARE REST (MISCELLANEOUS) ×1 IMPLANT
KIT PINK PAD W/HEAD ARM REST (MISCELLANEOUS) ×1 IMPLANT
KIT TURNOVER CYSTO (KITS) ×1 IMPLANT
LABEL OR SOLS (LABEL) ×1 IMPLANT
LIGASURE VESSEL 5MM BLUNT TIP (ELECTROSURGICAL) IMPLANT
MANIFOLD NEPTUNE II (INSTRUMENTS) ×1 IMPLANT
NEEDLE HYPO 22GX1.5 SAFETY (NEEDLE) ×1 IMPLANT
PACK LAP CHOLECYSTECTOMY (MISCELLANEOUS) ×1 IMPLANT
PAD OB MATERNITY 4.3X12.25 (PERSONAL CARE ITEMS) ×1 IMPLANT
PAD PREP 24X41 OB/GYN DISP (PERSONAL CARE ITEMS) ×1 IMPLANT
SCRUB CHG 4% DYNA-HEX 4OZ (MISCELLANEOUS) ×1 IMPLANT
SOL ELECTROSURG ANTI STICK (MISCELLANEOUS) ×1
SOLUTION ELECTROSURG ANTI STCK (MISCELLANEOUS) IMPLANT
SUT MNCRL 4-0 (SUTURE) ×1
SUT MNCRL 4-0 27XMFL (SUTURE) ×1
SUT VICRYL 0 AB UR-6 (SUTURE) IMPLANT
SUT VICRYL 3-0 27IN (SUTURE) IMPLANT
SUTURE MNCRL 4-0 27XMF (SUTURE) ×1 IMPLANT
SYS BAG RETRIEVAL 10MM (BASKET) ×1
SYSTEM BAG RETRIEVAL 10MM (BASKET) IMPLANT
TRAP FLUID SMOKE EVACUATOR (MISCELLANEOUS) ×1 IMPLANT
TRAY FOLEY SLVR 16FR LF STAT (SET/KITS/TRAYS/PACK) IMPLANT
TROCAR XCEL NON-BLD 5MMX100MML (ENDOMECHANICALS) ×1 IMPLANT
TUBING EVAC SMOKE HEATED PNEUM (TUBING) IMPLANT
WATER STERILE IRR 500ML POUR (IV SOLUTION) ×1 IMPLANT

## 2022-11-12 NOTE — Op Note (Signed)
Operative Note    Name: Chelsea Duncan  Date of Service: 11/12/2022  DOB: [birth year]  MRN: [id number]   Pre-Operative Diagnosis:  1) Intraabdominal hemorrhage  2) status post postpartum bilateral tubal ligation   Post-Operative Diagnosis:  1) Intraabdominal hemorrhage  2) status post postpartum bilateral tubal ligation 3) Avulsion of left fallopian tube   Procedures:  1) Laparoscopic bilateral salpingectomy 2) Repair of left fallopian tube avulsion and bleeding from associated vessels. 3) Evacuation of hemoperitoneum (~1,800 mL)   Primary Surgeon: Prentice Docker, MD   Assistant Surgeon: Benjaman Kindler, MD - No other capable assistant available, in surgery requiring high level assistant. This was a complicated procedure requiring an assistant with a very high skill level for surgery and the ability to assist in surgical decision making, which was critical to the outcome of this case.   EBL:  Pre-operative blood loss (hemoperitoneum): 1,800 mL Operative blood loss: 40 mL   IVF: 1,100 mL   Urine output: 250 mL clear urine at end of case  Specimens: right and left fallopian tubes  Drains: none  Complications: None   Disposition: PACU   Condition: Stable   Findings:  1) Significant hemoperitoneum with evacuation resulting at approximately 1,800 mL output 2) left tubal remnant tear with associated bleeding from the tear and nearby enlarged vessels from recently postpartum uterus. 3) small hematoma at area of tubal ligation on right fallopian tube.  Otherwise appeared hemostatic. 4) otherwise normal-appearing postpartum uterus and bilateral ovaries. 5) no other source for bleeding noted  Procedure Summary:  The patient was taken to the operating room where general anesthesia was administered and found to be adequate. She was placed in the dorsal supine lithotomy position in [place] stirrups and prepped and draped in usual sterile fashion. After a timeout was called an  indwelling catheter was placed in her bladder.  She was noted to have a more significant obstetrical laceration then noted at the time of delivery.  The noted laceration at the time of delivery was a first-degree perineal and some vaginal.  Noted in the operating room was a slightly more significant second-degree laceration that was nearly hemostatic.  The second-degree laceration was repaired using 3-0 Vicryl in the standard fashion.  Hemostasis was noted.  Attention was turned to the abdomen where a 5 mm left upper quadrant (Palmer's point) incision was made with the scalpel. Entry into the abdomen was obtained via Optiview trocar technique (a blunt entry technique with camera visualization through the obturator upon entry). Verification of entry into the abdomen was obtained using opening pressures. The abdomen was insufflated with CO2. The camera was introduced through the trocar with verification of atraumatic entry.  An inspection of the abdomen and pelvis was undertaken with the above-noted findings.  Also of note there were no adhesions where the umbilical closure had been performed the day before.  The surgical skin glue had been removed from the infraumbilical incision prior to the start of the case.  At this point the incision was opened by removing the suture from the skin and then from the fascia under direct intra-abdominal camera visualization.  An 11 mm Hassan trocar was introduced.  The balloon at the end of the trocar was insufflated and the trocar was affixed to the skin at a set trocar depth.  A right lateral 5 mm incision was made and a port site was created under direct intra-abdominal camera visualization without incident.  Similarly, a left lateral 5 mm port site was created under  direct intra-abdominal camera visualization without difficulty.  Initial evacuation of hemoperitoneum was undertaken in order to create better visualization.  The left bowel was close to the site of the  procedure on the left fallopian tube from yesterday.  There appeared to be a rent in the fallopian tube and bleeding that was mild from the site.  Decision was made to remove the entire remnants of fallopian tube.  This was accomplished by identifying the ovary and infundibulopelvic ligament with the bowel well away from this area.  The fimbriated end of the fallopian tube was elevated and using the LigaSure device the mesosalpinx was cauterized and cut in a lateral to medial fashion with complete removal of the left fallopian tube.  Some mild oozing from the site was noted.  Hemostasis was obtained using cautery from the LigaSure and from the bipolar electrocautery device.  Once hemostasis was assured, attention was turned to the right fallopian tube.  The same procedure was carried out on the right with assurance made to only transect and cauterized the mesosalpinx.  There were several areas of oozing along this line the mesosalpinx.  This was cauterized using the LigaSure and the bipolar electrocautery device.  Again hemostasis was assured.  The specimens were placed in the posterior cul-de-sac.  A large amount of time was spent in evacuating hemoperitoneum with careful attention to avoid the bowels.  There was also blood noted around the liver, which was carefully removed.  The patient was put in reverse Trendelenburg position to facilitate flow of any remaining hemoperitoneum into the pelvis.  This was evacuated as well as possible in this position.  The patient was slowly taken back to Trendelenburg position with evacuation of all blood in the pelvis and in the right paracolic and left paracolic gutters.  She was again placed in reverse Trendelenburg following the same procedure as before.  This ensured as much removal of hemoperitoneum was possible.  There was some irrigation performed and this was evacuated as well.  The intra-abdominal pressure was dropped to 3 mmHg and hemostasis was once again verified  at both pedicles where fallopian tubes were removed.  She was monitored this way for about 30 minutes overall with no bleeding at all noted from either pedicle line.  The specimen retrieval bag was introduced through the umbilical port and both specimens were placed in the specimen retrieval bag and easily removed through the umbilical port.  Arista 3 g was placed over the pedicle lines to ensure ongoing hemostasis.  No other hemoperitoneum noted.  No active bleeding noted at this time.  The decision was made to terminate the procedure since she had been hemostatic along the pedicle lines for at least 30 to 45 minutes at very low intra-abdominal pressure.  The infraumbilical trocar was removed.  Closure of the fascia was performed using 0 Vicryl on a UR 6 needle at this incision site.  The camera was reintroduced to verify that this was done without injury to underlying structures.  The abdomen was then emptied of CO2 with the aid of 5 deep breaths from anesthesia.  All trocars were removed.  All skin was closed using 4-0 Monocryl in a subcuticular fashion.  Surgical skin glue was applied over the incisions for reinforcement.  Exparel was injected at each incision site once the surgical glue was dry.  A total of 15 mL of Exparel was injected.  This was not diluted.  Attention was returned to the pelvis.  The Foley catheter was  removed.  A gentle digital sweep of the vagina was performed to ensure no remaining sponges or instruments.  The surgical assistant performed tissue retraction, cauterization along the pedicle lines where the ankle was better from her side of the table, creation of one of the port sites.  Also presence was critical for decision making regarding the best way to obtain hemostasis along these very vascular and oozing pedicle lines.  She also assisted with camera management from port port, evacuation of some of the hemoperitoneum, and tissue extraction.  The patient tolerated the procedure  well.  Sponge, lap, needle, and instrument counts were correct x 2.  VTE prophylaxis: SCDs. Antibiotic prophylaxis: Ancef 2 grams IV prior to skin incision. She was awakened in the operating room and was taken to the PACU in stable condition. The assistant surgeon was an MD due to lack of availability of another Counselling psychologist.   Prentice Docker, MD 11/12/2022 11:48 AM

## 2022-11-12 NOTE — Progress Notes (Signed)
Day of Surgery Procedure(s) (LRB): LAPAROSCOPY DIAGNOSTIC (N/A) LAPAROSCOPIC BILATERAL SALPINGECTOMY, REAPAIR OF LEFT TUBE AVULSION, EVACUATION OF HEMOPERITONEUM (N/A)  POD#0 from above surgery.   Subjective: Patient reports no issues. She has had some cramping, voided 1,000 mL at least, since surgery (about 6 hours).  Ambulating, tolerating PO, pain well controlled on PO pain medication. She denies feeling dizzy or lightheaded with ambulation. .    Objective: I have reviewed patient's vital signs. BP 113/79   Pulse 85   Temp 98.9 F (37.2 C) (Oral)   Resp 20   Ht '5\' 3"'$  (1.6 m)   Wt 64.4 kg   SpO2 98%   Breastfeeding Unknown   BMI 25.15 kg/m    General: alert, cooperative, and no distress GI: soft, non-tender; bowel sounds normal; no masses,  no organomegaly Extremities: extremities normal, atraumatic, no cyanosis or edema Uterine fundus at U Assessment: s/p Procedure(s): LAPAROSCOPY DIAGNOSTIC (N/A) LAPAROSCOPIC BILATERAL SALPINGECTOMY, REAPAIR OF LEFT TUBE AVULSION, EVACUATION OF HEMOPERITONEUM (N/A): stable  Plan: Advance diet Encourage ambulation Discontinue IV fluids CBC tonight and in the AM to assess stability Consider another unit pRBCs if hgb < 7.0.  Otherwise, continue to monitor.     LOS: 2 days    Prentice Docker, MD 11/12/2022, 6:23 PM

## 2022-11-12 NOTE — Anesthesia Procedure Notes (Addendum)
Procedure Name: Intubation Date/Time: 11/12/2022 8:49 AM  Performed by: Doreen Salvage, CRNAPre-anesthesia Checklist: Patient identified, Emergency Drugs available, Suction available and Patient being monitored Patient Re-evaluated:Patient Re-evaluated prior to induction Oxygen Delivery Method: Circle system utilized Preoxygenation: Pre-oxygenation with 100% oxygen Induction Type: IV induction, Cricoid Pressure applied and Rapid sequence Ventilation: Mask ventilation without difficulty Laryngoscope Size: Mac and 3 Grade View: Grade II Tube type: Oral Tube size: 7.0 mm Number of attempts: 1 Airway Equipment and Method: Stylet Placement Confirmation: ETT inserted through vocal cords under direct vision, positive ETCO2 and breath sounds checked- equal and bilateral Secured at: 23 (at the lip) cm Tube secured with: Tape Dental Injury: Teeth and Oropharynx as per pre-operative assessment

## 2022-11-12 NOTE — TOC Initial Note (Signed)
Transition of Care [facility]) - Initial/Assessment Note    Patient Details  Name: Chelsea Duncan MRN: [id number] Date of Birth: September 26, 1986  [facility]) CM/SW Contact:    Shelbie Hutching, RN Phone Number: 11/12/2022, 3:07 PM  Clinical Narrative:                 [facility] acknowledges consult for Edinburgh Score of 10.  Patient S/P ex lap for intraabdominal hemorrhage after tubal ligation. RNCM will see how patient is doing tomorrow post op day 1, and if appropriate discuss postpartum depression and provide mental health resources.         Patient Goals and CMS Choice            Expected Discharge Plan and Services                                              Prior Living Arrangements/Services                       Activities of Daily Living Home Assistive Devices/Equipment: None ADL Screening (condition at time of admission) Patient's cognitive ability adequate to safely complete daily activities?: Yes Is the patient deaf or have difficulty hearing?: No Does the patient have difficulty seeing, even when wearing glasses/contacts?: No Does the patient have difficulty concentrating, remembering, or making decisions?: No Patient able to express need for assistance with ADLs?: No Does the patient have difficulty dressing or bathing?: No Independently performs ADLs?: Yes (appropriate for developmental age) Does the patient have difficulty walking or climbing stairs?: No Weakness of Legs: None Weakness of Arms/Hands: None  Permission Sought/Granted                  Emotional Assessment              Admission diagnosis:  Encounter for elective induction of labor [Z34.90] Patient Active Problem List   Diagnosis Date Noted   Intraabdominal hemorrhage 11/12/2022   Encounter for elective induction of labor 11/10/2022   [redacted] weeks gestation of pregnancy 11/10/2022   Admission for sterilization 11/10/2022   Amenorrhea 04/02/2022   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Phone: [phone]: [phone]    Social Determinants of Health (SDOH) [place] Food Insecurity (11/10/2022)  Housing: Low Risk  (11/10/2022)  Transportation Needs: No Transportation Needs (11/10/2022)  Utilities: Not At Risk (11/10/2022)  Alcohol Screen: Low Risk  (08/18/2017)  Depression (PHQ2-9): Low Risk  (09/10/2022)  Tobacco Use: Low Risk  (11/12/2022)   SDOH Interventions:     Readmission Risk Interventions     No data to display

## 2022-11-12 NOTE — Progress Notes (Signed)
No acute events overnight.  She had a CT scan which showed.   Per report " Postoperative changes of recent postpartum tubal ligation with small volume of pneumoperitoneum and moderate volume of mixed attenuation ascites, including considerable volume of intraperitoneal hemorrhage, as detailed above. No well organized fluid collections are identified at this time."  She received 2 units pRBCs. She did have some bouts of considerable pain, that she reports is better now.  She has had trouble going from sitting position to supine. So, has sat in chair all night.  She has been NPO since about 630pm last night. She notes regular lochia.    Objective: BP 122/81   Pulse 76   Temp 97.8 F (36.6 C) (Oral)   Resp 18   Ht '5\' 3"'$  (1.6 m)   Wt 64.4 kg   SpO2 100%   Breastfeeding Unknown   BMI 25.15 kg/m    Gen: NAD, sitting comfortably in chair Pulm: No increased work of breathing. Abd: moderately distended.not palpated. Ext: mild symmetric bilateral edema.  A/P: PPD#2 from NSVD, POD#1 s/p pp BTL with post-op likely venous bleed. Now status post 2 units pRBCs and has considerable pain overall, though currently well-controlled. - plan to go to OR this morning for laparoscopy to find venous bleed. Will perform salpingectomy bilaterally to make sure those lines are clean, evacuate hemoperitoneum, possible laparotomy, possible supracervical hysterectomy. She was consented for all the above and wishes to proceed.  Going to the OR ASAP.   Prentice Docker, MD, [facility] 11/12/2022 7:57 AM

## 2022-11-12 NOTE — Anesthesia Preprocedure Evaluation (Signed)
Anesthesia Evaluation  Patient identified by MRN, date of birth, ID band Patient awake    Reviewed: Allergy & Precautions, H&P , NPO status , Patient's Chart, lab work & pertinent test results, reviewed documented beta blocker date and time   History of Anesthesia Complications (+) PONV and history of anesthetic complications  Airway Mallampati: II  TM Distance: >3 FB Neck ROM: full    Dental  (+) Teeth Intact   Pulmonary neg pulmonary ROS   Pulmonary exam normal        Cardiovascular negative cardio ROS Normal cardiovascular exam Rhythm:regular Rate:Normal     Neuro/Psych  PSYCHIATRIC DISORDERS Anxiety Depression    negative neurological ROS     GI/Hepatic negative GI ROS, Neg liver ROS,,,  Endo/Other  negative endocrine ROS    Renal/GU negative Renal ROS  negative genitourinary   Musculoskeletal   Abdominal   Peds  Hematology negative hematology ROS (+)   Anesthesia Other Findings Past Medical History: No date: Allergy No date: Anxiety No date: Bone tumor (benign)     Comment:  tumor vs cyst removed btwn age [age] y.o.  03/2016: BRCA negative     Comment:  MyRisk neg except NBN VUS No date: Chicken pox No date: Complication of anesthesia No date: COVID-19     Comment:  12/2019 08/29/2020: Depression during pregnancy 03/2016: Family history of breast cancer     Comment:  IBIS=18.4%  She has been tested for at least BRCA and               was negative (~2017). Tyrer-Cuzick (lifetime) was ~18%.  No date: PONV (postoperative nausea and vomiting) No date: Pyelonephritis No date: S/P skin biopsy     Comment:  neck normal  No date: UTI (urinary tract infection) Past Surgical History: No date: BONE CYST EXCISION     Comment:  right foot. 11/11/2022: TUBAL LIGATION; N/A     Comment:  Procedure: POST PARTUM TUBAL LIGATION;  Surgeon:               Will Bonnet, MD;  Location: [facility] ORS;  Service:                Gynecology;  Laterality: N/A; BMI    Body Mass Index: 25.15 kg/m     Reproductive/Obstetrics negative OB ROS                             Anesthesia Physical Anesthesia Plan  ASA: 2 and emergent  Anesthesia Plan: General ETT   Post-op Pain Management:    Induction:   PONV Risk Score and Plan:   Airway Management Planned:   Additional Equipment:   Intra-op Plan:   Post-operative Plan:   Informed Consent: I have reviewed the patients History and Physical, chart, labs and discussed the procedure including the risks, benefits and alternatives for the proposed anesthesia with the patient or authorized representative who has indicated his/her understanding and acceptance.     Dental Advisory Given  Plan Discussed with: CRNA  Anesthesia Plan Comments:        Anesthesia Quick Evaluation

## 2022-11-12 NOTE — Transfer of Care (Signed)
Immediate Anesthesia Transfer of Care Note  Patient: Chelsea Duncan  Procedure(s) Performed: Procedure(s): LAPAROSCOPY DIAGNOSTIC (N/A) LAPAROSCOPIC BILATERAL SALPINGECTOMY, REAPAIR OF LEFT TUBE AVULSION, EVACUATION OF HEMOPERITONEUM (N/A)  Patient Location: PACU  Anesthesia Type:General  Level of Consciousness: sedated  Airway & Oxygen Therapy: Patient Spontanous Breathing and Patient connected to face mask oxygen  Post-op Assessment: Report given to RN and Post -op Vital signs reviewed and stable  Post vital signs: Reviewed and stable  Last Vitals:  Vitals:   11/12/22 0825 11/12/22 1157  BP: 128/75 104/61  Pulse: 75 81  Resp: 18 13  Temp: 36.8 C 37.2 C  SpO2: 26% 333%    Complications: No apparent anesthesia complications

## 2022-11-13 ENCOUNTER — Encounter: Payer: Self-pay | Admitting: Obstetrics and Gynecology

## 2022-11-13 LAB — BPAM RBC
Blood Product Expiration Date: 202403082359
Blood Product Expiration Date: 202403082359
ISSUE DATE / TIME: 202402060044
ISSUE DATE / TIME: 202402060323
Unit Type and Rh: 6200
Unit Type and Rh: 6200

## 2022-11-13 LAB — CBC
HCT: 24.8 % — ABNORMAL LOW (ref 36.0–46.0)
Hemoglobin: 8.2 g/dL — ABNORMAL LOW (ref 12.0–15.0)
MCH: 28.9 pg (ref 26.0–34.0)
MCHC: 33.1 g/dL (ref 30.0–36.0)
MCV: 87.3 fL (ref 80.0–100.0)
Platelets: 200 10*3/uL (ref 150–400)
RBC: 2.84 MIL/uL — ABNORMAL LOW (ref 3.87–5.11)
RDW: 14.6 % (ref 11.5–15.5)
WBC: 10.6 10*3/uL — ABNORMAL HIGH (ref 4.0–10.5)
nRBC: 0 % (ref 0.0–0.2)

## 2022-11-13 LAB — TYPE AND SCREEN
ABO/RH(D): A POS
Antibody Screen: NEGATIVE
Unit division: 0
Unit division: 0

## 2022-11-13 LAB — SURGICAL PATHOLOGY

## 2022-11-13 MED ORDER — OXYCODONE-ACETAMINOPHEN 5-325 MG PO TABS: 1.0000 | ORAL_TABLET | Freq: Four times a day (QID) | ORAL | 0 refills | Status: DC | PRN

## 2022-11-13 MED ORDER — IBUPROFEN 600 MG PO TABS: 600.0000 mg | ORAL_TABLET | Freq: Four times a day (QID) | ORAL | 0 refills | Status: DC | PRN

## 2022-11-13 MED ORDER — FERROUS SULFATE 325 (65 FE) MG PO TABS: 325.0000 mg | ORAL_TABLET | Freq: Two times a day (BID) | ORAL | 3 refills | Status: DC

## 2022-11-18 NOTE — Anesthesia Postprocedure Evaluation (Signed)
Anesthesia Post Note  Patient: Chesni Farman  Procedure(s) Performed: LAPAROSCOPY DIAGNOSTIC LAPAROSCOPIC BILATERAL SALPINGECTOMY, REAPAIR OF LEFT TUBE AVULSION, EVACUATION OF HEMOPERITONEUM  Patient location during evaluation: PACU Anesthesia Type: General Level of consciousness: awake and alert Pain management: pain level controlled Vital Signs Assessment: post-procedure vital signs reviewed and stable Respiratory status: spontaneous breathing, nonlabored ventilation, respiratory function stable and patient connected to nasal cannula oxygen Cardiovascular status: blood pressure returned to baseline and stable Postop Assessment: no apparent nausea or vomiting Anesthetic complications: no   No notable events documented.   Last Vitals:  Vitals:   11/13/22 0232 11/13/22 0750  BP: 112/64 117/75  Pulse: 75 84  Resp: 18 18  Temp: 36.9 C 36.9 C  SpO2: 99% 100%    Last Pain:  Vitals:   11/13/22 0805  TempSrc:   PainSc: [place] Milos Milligan

## 2022-12-05 ENCOUNTER — Ambulatory Visit (INDEPENDENT_AMBULATORY_CARE_PROVIDER_SITE_OTHER): Payer: BC Managed Care – PPO | Admitting: Licensed Clinical Social Worker

## 2022-12-05 DIAGNOSIS — F411 Generalized anxiety disorder: Secondary | ICD-10-CM

## 2022-12-05 NOTE — Progress Notes (Signed)
Consistent. Virtual Visit via Video Note  I connected with Chelsea Duncan on 12/05/22 at 11:00 AM EST by a video enabled telemedicine application and verified that I am speaking with the correct person using two identifiers.  Location: Patient: home Provider: remote office [place], [place])   I discussed the limitations of evaluation and management by telemedicine and the availability of in person appointments. The patient expressed understanding and agreed to proceed.   I discussed the assessment and treatment plan with the patient. The patient was provided an opportunity to ask questions and all were answered. The patient agreed with the plan and demonstrated an understanding of the instructions.   The patient was advised to call back or seek an in-person evaluation if the symptoms worsen or if the condition fails to improve as anticipated.  I provided 60  minutes of non-face-to-face time during this encounter.   [place], LCSW  THERAPIST PROGRESS NOTE  Session Time: 11a-12p  Participation Level: Active  Behavioral Response: Neat and Well GroomedAlertAnxious  Type of Therapy: Individual Therapy  Treatment Goals addressed:Learn and implement coping skills that result in a reduction of anxiety and worry, and improve daily functioning per pt report 3 out of 5 sessions documented   Reduce overall frequency, intensity, and duration of the anxiety so that daily functioning is not impaired per pt self report 3 out of 5 sessions documented.    ProgressTowards Goals: Progressing  Interventions: CBT and Motivational Interviewing  Summary: Chelsea Duncan is a 37 y.o. female who presents with improving symptoms related to anxiety diagnosis.   Allowed pt to explore and express thoughts and feelings associated with recent life situations and external stressors.  Patient reports that she is continuing to experience stress associated with recent childbirth--patient's baby is 75  weeks old.  Allowed patient to explore her thoughts and feelings associated with the childbirth situation and tubal ligation.  Patient reports that she experienced complications associated with her tubal ligation, and had to end up having to follow-up surgeries.  Allowed patient to explore the overall psychological impact of this.  Patient reports that she feels that she is experiencing some postpartum hormonal dysregulation, which could be impacting overall mood and anxiety.  Patient reports that she feels that her mother-in-law is a trigger for her, but then patient will lash out on her husband when she feels agitated by his mother.  Patient states " I know it is not fair" but patient states she does not know any other coping skills to help her when she feels trapped in situations.  Reviewed coping skills with patient, and allowed patient to identify which coping skills she feels that she would be able to incorporate into her daily routine.  Discussed several concepts that we will explore in future sessions-perfectionism, family relations, holiday triggers, couples counseling, and work related stress once patient transitions back from maternity leave.  Continued recommendations are as follows: self care behaviors, positive social engagements, focusing on overall work/home/life balance, and focusing on positive physical and emotional wellness.  .  Suicidal/Homicidal: No  Therapist Response: Pt is continuing to apply interventions learned in session into daily life situations. Pt is currently on track to meet goals utilizing interventions mentioned above. Personal growth and progress noted. Treatment to continue as indicated.   Plan: Return again in 4 weeks.  Diagnosis: Encounter Diagnosis  Name Primary?   GAD (generalized anxiety disorder) Yes    Collaboration of Care: Other pt encouraged to continue with psychiatrist of record, Dr.  Eappen  Patient/Guardian was advised Release of Information  must be obtained prior to any record release in order to collaborate their care with an outside provider. Patient/Guardian was advised if they have not already done so to contact the registration department to sign all necessary forms in order for [place] to release information regarding their care.   Consent: Patient/Guardian gives verbal consent for treatment and assignment of benefits for services provided during this visit. Patient/Guardian expressed understanding and agreed to proceed.   [place], LCSW 12/05/2022

## 2022-12-05 NOTE — Patient Instructions (Addendum)
ANXIETY Fighting this does not help--so I will just relax and breathe deeply and let it float away This feeling is not comfortable but I can handle it By relaxing through these feelings, I learned to face my fears I can feel anxious and still deal with this situation This is not a real emergency--I can slow down and think about what I need to do This feeling will go away By staying present and focused on my task--my anxiety will decrease These are just thoughts--not reality Anxiety will not hurt me Feeling tense is natural.  It tells me it is time to use coping strategies Things are not as bad as I am making them out to be Do not discount the positives  FEAR:   I've done this before so I can do it again I'll be glad I did it when this is over I'll feel better when I am actually in the situation I'll just do the best I can By facing my fears I can overcome them Worry doesn't help Whatever happens, happens. I can handle it   FEELING OVERWHELMED  Stay focused on the present. What do I need to do right now?  It will soon be over Its not the worst thing that could happen Step by step until its over I don't need to eliminate stress, just keep it under control  Once I label my stress from 1-10 and I can watch it go down Take a breath   COPING STATEMENTS FOR FEELING PHOBIAS I can always retreat out of the situation if I decide to There is nothing dangerous here Take deep breaths and take your time This feeling is just adrenaline.  It will pass in a couple of minutes These feelings are not dangerous   COPING STATEMENTS FOR PANIC  This is not dangerous I will just let my body pass through this I have survived panic attacks before and I will survive this as well Nothing serious is going to happen This will pass  COPING STATEMENTS FOR ANGER MANAGEMENT  It is not worth getting mad about I will not take this personally I am in charge--not my anger I am going to breathe slowly  until I know what to do Getting angry is not going to help I can handle this and stay in control Remember to breathe--remember to breathe People are not against me--they are for themselves

## 2022-12-17 ENCOUNTER — Telehealth (INDEPENDENT_AMBULATORY_CARE_PROVIDER_SITE_OTHER): Payer: BC Managed Care – PPO | Admitting: Psychiatry

## 2022-12-17 ENCOUNTER — Encounter: Payer: Self-pay | Admitting: Psychiatry

## 2022-12-17 DIAGNOSIS — F4010 Social phobia, unspecified: Secondary | ICD-10-CM

## 2022-12-17 DIAGNOSIS — G4709 Other insomnia: Secondary | ICD-10-CM | POA: Diagnosis not present

## 2022-12-17 DIAGNOSIS — F4381 Prolonged grief disorder: Secondary | ICD-10-CM

## 2022-12-17 DIAGNOSIS — F411 Generalized anxiety disorder: Secondary | ICD-10-CM | POA: Diagnosis not present

## 2022-12-17 MED ORDER — CITALOPRAM HYDROBROMIDE 20 MG PO TABS: 20.0000 mg | ORAL_TABLET | Freq: Every day | ORAL | 0 refills | Status: DC

## 2022-12-17 NOTE — Progress Notes (Signed)
Virtual Visit via Video Note  I connected with Chelsea Duncan on 12/17/22 at 10:00 AM EDT by a video enabled telemedicine application and verified that I am speaking with the correct person using two identifiers.  Location Provider Location : [facility] Patient Location : Home  Participants: Patient , Provider    I discussed the limitations of evaluation and management by telemedicine and the availability of in person appointments. The patient expressed understanding and agreed to proceed.   I discussed the assessment and treatment plan with the patient. The patient was provided an opportunity to ask questions and all were answered. The patient agreed with the plan and demonstrated an understanding of the instructions.   The patient was advised to call back or seek an in-person evaluation if the symptoms worsen or if the condition fails to improve as anticipated.   [facility] MD OP Progress Note  12/17/2022 12:54 PM Chelsea Duncan  MRN:  [id number]  Chief Complaint:  Chief Complaint  Patient presents with   Follow-up   Anxiety   Depression   Insomnia   Medication Refill   HPI: Chelsea Duncan is a 37 year old Caucasian female, married, currently employed, lives in [place], has a history of GAD, social anxiety disorder, bereavement, insomnia, status postpartum, date of delivery-November 10, 2022, vaginal, spontaneous, presented for follow-up appointment.  Patient today reports although her delivery was uneventful she had to go back for tubal ligation on 11/11/2022, however and developed complications and needed laparoscopic bilateral salpingectomy, repair of left tube avulsion, evacuation of hemoperitoneum-11/12/2022.  Patient reports she was in a lot of pain for a few days and was prescribed oxycodone.  She reports she is currently better, has completely healed, currently not on pain medications.  Patient reports her newborn continues to need feeding and care at night, she hence has to wake up 4-5 times  at night.  She reports she and her spouse have decided that she will continue to do this until she returns to work in 4 more weeks.  However once she returns to work she and her spouse are planning to split responsibilities at night so that patient could also get some rest.  Patient reports she has definitely noticed mood swings, irritability especially around her mother-in-law, they struggle with their relationship.  Patient reports she has decided to get back on her Celexa currently takes 15 mg and has been doing so since the past 2 weeks or so.  Currently denies side effects.  Patient denies any suicidality, homicidality or perceptual disturbances.  Looks forward to returning to work.  Continues to be in CBT, just met with her therapist recently on 12/03/2022.  She and her spouse have started family counseling as well.  Patient denies any other concerns today.  Visit Diagnosis:    ICD-10-CM   1. GAD (generalized anxiety disorder)  F41.1 citalopram (CELEXA) 20 MG tablet    2. 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 Other insomnia  G47.09    Due to caring for a newborn      Past Psychiatric History: I have reviewed past psychiatric history from progress note on 10/11/2021.  Past trials of medications like Zoloft, Celexa, Xanax.  Reviewed neuropsychological testing per Dr. Donnita Falls does not meet criteria for ADHD-dated 03/29/2019-04/20/2019.  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    BRCA negative 03/2016   MyRisk neg except NBN VUS   Chicken pox    Complication of anesthesia    COVID-19    January 13, 2020   Depression during pregnancy 08/29/2020   Family history of breast cancer 03/2016   IBIS=18.4%  She has been tested for at least BRCA and was negative 2016/01/13). Tyrer-Cuzick  (lifetime) was ~18%.    PONV (postoperative nausea and vomiting)    Pyelonephritis    S/P skin biopsy    neck normal    UTI (urinary tract infection)     Past Surgical History:  Procedure Laterality Date   BONE CYST EXCISION     right foot.   LAPAROSCOPY N/A 11/12/2022   Procedure: LAPAROSCOPY DIAGNOSTIC;  Surgeon: Will Bonnet, MD;  Location: [facility];  Service: Gynecology;  Laterality: N/A;   TOTAL LAPAROSCOPIC HYSTERECTOMY WITH BILATERAL SALPINGO OOPHORECTOMY N/A 11/12/2022   Procedure: LAPAROSCOPIC BILATERAL SALPINGECTOMY, REAPAIR OF LEFT TUBE AVULSION, EVACUATION OF HEMOPERITONEUM;  Surgeon: Will Bonnet, MD;  Location: [facility];  Service: Gynecology;  Laterality: N/A;   TUBAL LIGATION N/A 11/11/2022   Procedure: POST PARTUM TUBAL LIGATION;  Surgeon: Will Bonnet, MD;  Location: [facility];  Service: Gynecology;  Laterality: N/A;    Family Psychiatric History: Reviewed family psychiatric history from progress note on 10/11/2021.  Family History:  Family History  Problem Relation Age of Onset   Cancer Mother 7       breast / died at 19 in 01/12/2010   Hypertension Mother    Depression Mother    Arthritis Father    Depression Father        committed suicide in 01/13/2015   Hypertension Father    Hypertension Sister    Heart disease Sister        heart murmur   Birth defects Sister    Depression Sister    Arthritis Maternal Grandmother    Other Maternal Grandmother        elevated tau protein; had corticobasal degneration    Cancer Paternal Grandmother        lung   Emphysema Paternal Grandfather     Social History: Reviewed social history from progress note on 10/11/2021. Social History   Socioeconomic History   Marital status: Married    Spouse name: matthew   Number of children: 3   Years of education: Not on file   Highest education level: Master's degree (e.g., MA, MS, MEng, MEd, MSW, MBA)  Occupational History   Not on file  Tobacco Use   Smoking status:  Never   Smokeless tobacco: Never  Vaping Use   Vaping Use: Never used  Substance and Sexual Activity   Alcohol use: Yes    Comment: social   Drug use: No   Sexual activity: Yes    Partners: Male    Birth control/protection: Surgical  Other Topics Concern   Not on file  Social History Narrative   Married 3 boys    Grew up in [place]    1 older sister 90 years older than her    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   Social Determinants of Health   Financial Resource Strain: Not on file  Food Insecurity: No Food Insecurity (11/10/2022)   Hunger Vital Sign    Worried About Running Out of Food in the Last Year: Never true    Ran  Out of Food in the Last Year: Never true  Transportation Needs: No Transportation Needs (11/10/2022)   PRAPARE - Hydrologist (Medical): No    Lack of Transportation (Non-Medical): No  Physical Activity: Not on file  Stress: Not on file  Social Connections: Not on file    Allergies:  Allergies  Allergen Reactions   Cefadroxil Nausea And Vomiting   Fire Ant Other (See Comments)    anaphylaxis anaphylaxis   Septra [Sulfamethoxazole-Trimethoprim] Nausea And Vomiting    Metabolic Disorder Labs: No results found for: "HGBA1C", "MPG" No results found for: "PROLACTIN" Lab Results  Component Value Date   CHOL 121 04/20/2021   TRIG 98.0 04/20/2021   HDL 34.40 (L) 04/20/2021   CHOLHDL 4 04/20/2021   VLDL 19.6 04/20/2021   LDLCALC 67 04/20/2021   LDLCALC 76 10/27/2018   Lab Results  Component Value Date   TSH 1.54 08/29/2021   TSH 3.10 04/20/2021    Therapeutic Level Labs: No results found for: "LITHIUM" No results found for: "VALPROATE" No results found for: "CBMZ"  Current Medications: Current Outpatient Medications  Medication Sig Dispense Refill   Ascorbic Acid (VITAMIN C) 100 MG tablet Take 100 mg by mouth daily.     cholecalciferol (VITAMIN D3) 25 MCG  (1000 UNIT) tablet Take 1,000 Units by mouth daily.     cyanocobalamin (VITAMIN B12) 1000 MCG/ML injection      ferrous sulfate 325 (65 FE) MG tablet Take 1 tablet (325 mg total) by mouth 2 (two) times daily with a meal.  3   ibuprofen (ADVIL) 600 MG tablet Take 1 tablet (600 mg total) by mouth every 6 (six) hours as needed for mild pain or cramping. 30 tablet 0   Prenatal Vit-Fe Fumarate-FA (PRENATAL MULTIVITAMIN) TABS tablet Take 1 tablet by mouth daily at 12 noon.     citalopram (CELEXA) 20 MG tablet Take 1 tablet (20 mg total) by mouth daily with breakfast. 90 tablet 0   No current facility-administered medications for this visit.     Musculoskeletal: Strength & Muscle Tone:  UTA Gait & Station: normal Patient leans: N/A  Psychiatric Specialty Exam: Review of Systems  Psychiatric/Behavioral:  Positive for dysphoric mood and sleep disturbance. The patient is nervous/anxious.   All other systems reviewed and are negative.   not currently breastfeeding.There is no height or weight on file to calculate BMI.  General Appearance: Casual  Eye Contact:  Fair  Speech:  Clear and Coherent  Volume:  Normal  Mood:  Anxious, Depressed, and Irritable, mostly situational  Affect:  Congruent  Thought Process:  Goal Directed and Descriptions of Associations: Intact  Orientation:  Full (Time, Place, and Person)  Thought Content: Logical   Suicidal Thoughts:  No  Homicidal Thoughts:  No  Memory:  Immediate;   Fair Recent;   Fair Remote;   Fair  Judgement:  Fair  Insight:  Fair  Psychomotor Activity:  Normal  Concentration:  Concentration: Fair and Attention Span: Fair  Recall:  [employer] of Knowledge: Fair  Language: Fair  Akathisia:  No  Handed:  Right  AIMS (if indicated): not done  Assets:  Communication Skills Desire for Improvement Housing Intimacy Resilience Talents/Skills Transportation Vocational/Educational  ADL's:  Intact  Cognition: WNL  Sleep:  Poor due to caring  for a newborn   Screenings:  Edinburgh Postnatal Depression Scale: In the Past 7 Days   I have been able to laugh and see the funny side of  things. As much as I always could  I have looked forward with enjoyment to things. As much as I ever did  I have blamed myself unnecessarily when things went wrong. Yes, some of the timeI have blamed myself unnecessarily when things went wrong.. Yes, some of the time. The comment is Has done it all herl life. Taken on 12/17/22 1007  I have been anxious or worried for no good reason. Yes, sometimes  I have felt scared or panicky for no good reason. Yes, sometimes  Things have been getting on top of me. No, I have been coping as well as ever  I have been so unhappy that I have had difficulty sleeping. Not at all  I have felt sad or miserable. Yes, quite oftenI have felt sad or miserable.. Yes, quite often. The comment is related to her mother in law. Taken on 12/17/22 1007  I have been so unhappy that I have been crying. Only occasionally  The thought of harming myself has occurred to me. Never  Edinburgh Postnatal Depression Scale Total 9   AIMS    Flowsheet Row Video Visit from 05/17/2022 in [place] Video Visit from 04/01/2022 in [place] Total Score 0 0      AUDIT    Flowsheet Row Office Visit from 06/11/2017 in [facility]  Alcohol Use Disorder Identification Test Final Score (AUDIT) 0      GAD-7    Flowsheet Row Video Visit from 07/26/2022 in [place] Video Visit from 05/17/2022 in [place] Video Visit from 04/01/2022 in [place] Video Visit from 12/31/2021 in [place] Counselor from 11/09/2021 in [place]  Total GAD-7 Score '13 3 1  7 14      '$ PHQ2-9    Flowsheet Row Video Visit from 09/10/2022 in [place] Counselor from 08/27/2022 in [place] at [facility] Video Visit from 07/26/2022 in [place] Video Visit from 05/17/2022 in [place] Video Visit from 04/01/2022 in [place]  PHQ-2 Total Score 0 0 0 0 0  PHQ-9 Total Score 4 -- -- -- --      Flowsheet Row Video Visit from 12/17/2022 in [place] ED to Hosp-Admission (Discharged) from 11/10/2022 in [place] Video Visit from 09/10/2022 in [place] No Risk No Risk No Risk        Assessment and Plan: Chelsea Duncan is a 36 year old Caucasian female, married, lives in [place], has a history of social anxiety, MDD, bereavement, insomnia was evaluated by telemedicine today.  Patient is status post partum-uneventful vaginal delivery on 11/10/2022 however developed complications to tubal ligation requiring laparoscopic salpingectomy, evacuation of hemoperitoneum on 11/12/2022, currently recovering, does report situational stressors, sleep problems, will benefit from psychotherapy sessions, medication management as noted below.  Plan GAD-improving Increase Celexa to 20 mg p.o. daily.  Currently on Celexa 15 mg p.o. daily since the past 15 days. Patient advised to continue CBT with [name]  Social anxiety disorder-improving Celexa 20 mg daily Continue CBT  Insomnia-restless due to caring for her newborn Will not prescribe a sleep medication at this time.  Patient provided support,  advised to catch up on sleep whenever baby takes a nap.  Patient to work on sleep hygiene.  Patient to use social support system.  Bereavement  disorder-improving Continue CBT  Completed [place] Post natal depression scale-patient scored 9/30.  Will monitor patient closely.  Currently back on Celexa.  Patient advised to continue family counseling.     Collaboration of Care: Collaboration of Care: Other review notes per Ms. Christina-therapist-12/05/2022, reviewed notes per Dr. Danise Edge post laparoscopic with evacuation of hemoperitoneum on 11/12/2022  Patient/Guardian was advised Release of Information must be obtained prior to any record release in order to collaborate their care with an outside provider. Patient/Guardian was advised if they have not already done so to contact the registration department to sign all necessary forms in order for [place] to release information regarding their care.   Consent: Patient/Guardian gives verbal consent for treatment and assignment of benefits for services provided during this visit. Patient/Guardian expressed understanding and agreed to proceed.   Follow-up in clinic in 6 weeks or sooner if needed.  This note was generated in part or whole with voice recognition software. Voice recognition is usually quite accurate but there are transcription errors that can and very often do occur. I apologize for any typographical errors that were not detected and corrected.    Ursula Alert, MD 12/17/2022, 12:54 PM

## 2022-12-27 ENCOUNTER — Ambulatory Visit
Admission: EM | Admit: 2022-12-27 | Discharge: 2022-12-27 | Disposition: A | Discharge status: Home / Self Care | Payer: BC Managed Care – PPO | Attending: Nurse Practitioner | Admitting: Nurse Practitioner

## 2022-12-27 DIAGNOSIS — Z1152 Encounter for screening for COVID-19: Secondary | ICD-10-CM | POA: Diagnosis not present

## 2022-12-27 DIAGNOSIS — J029 Acute pharyngitis, unspecified: Secondary | ICD-10-CM | POA: Insufficient documentation

## 2022-12-27 DIAGNOSIS — Z20828 Contact with and (suspected) exposure to other viral communicable diseases: Secondary | ICD-10-CM | POA: Insufficient documentation

## 2022-12-27 DIAGNOSIS — R519 Headache, unspecified: Secondary | ICD-10-CM | POA: Diagnosis present

## 2022-12-27 LAB — GROUP A STREP BY PCR: Group A Strep by PCR: NOT DETECTED

## 2022-12-27 LAB — RESP PANEL BY RT-PCR (RSV, FLU A&B, COVID)  RVPGX2
Influenza A by PCR: NEGATIVE
Influenza B by PCR: NEGATIVE
Resp Syncytial Virus by PCR: NEGATIVE
SARS Coronavirus 2 by RT PCR: NEGATIVE

## 2022-12-27 NOTE — ED Triage Notes (Signed)
Pt c/o sinus pressure,sore throat & bilateral ear pressure x2 days. Denies any fevers,chills or bodyaches.

## 2022-12-27 NOTE — ED Provider Notes (Signed)
[facility]    CSN: [id number] Arrival date & time: 12/27/22  1107      History   Chief Complaint Chief Complaint  Patient presents with   Sinus Pressure   Sore Throat   Ear Pressure    HPI Chelsea Duncan is a 37 y.o. female  presents for evaluation of URI symptoms for 2 days. Patient reports associated symptoms of sore throat, headache, ear pain, sinus pressure. Denies N/V/D, fevers, cough, congestion, body aches, shortness of breath. Patient does not have a hx of asthma or smoking.  Her son recently had the flu.  Pt has taken Tylenol OTC for symptoms. Pt has no other concerns at this time.    Sore Throat Associated symptoms include headaches.    Past Medical History:  Diagnosis Date   Allergy    Anxiety    Bone tumor (benign)    tumor vs cyst removed btwn age [age] y.o.    BRCA negative 03/2016   MyRisk neg except NBN VUS   Chicken pox    Complication of anesthesia    COVID-19    12/2019   Depression during pregnancy 08/29/2020   Family history of breast cancer 03/2016   IBIS=18.4%  She has been tested for at least BRCA and was negative (~2017). Tyrer-Cuzick (lifetime) was ~18%.    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   LAPAROSCOPY N/A 11/12/2022   Procedure: LAPAROSCOPY DIAGNOSTIC;  Surgeon: Will Bonnet, MD;  Location: [facility];  Service: Gynecology;  Laterality: N/A;   TOTAL LAPAROSCOPIC HYSTERECTOMY WITH BILATERAL SALPINGO OOPHORECTOMY N/A 11/12/2022   Procedure: LAPAROSCOPIC BILATERAL SALPINGECTOMY, REAPAIR OF LEFT TUBE AVULSION, EVACUATION OF HEMOPERITONEUM;  Surgeon: Will Bonnet, MD;  Location: [facility];  Service: Gynecology;  Laterality: N/A;   TUBAL LIGATION N/A 11/11/2022   Procedure: POST PARTUM TUBAL LIGATION;  Surgeon: Will Bonnet, MD;  Location: [facility];  Service: Gynecology;  Laterality: N/A;    OB History     Gravida  4   Para  4   Term  4   Preterm  0   AB  0   Living  4      SAB  0   IAB  0   Ectopic  0   Multiple  0   Live Births  4            Home Medications    Prior to Admission medications   Medication Sig Start Date End Date Taking? Authorizing Provider  Ascorbic Acid (VITAMIN C) 100  MG tablet Take 100 mg by mouth daily.   Yes [provider]  cholecalciferol (VITAMIN D3) 25 MCG (1000 UNIT) tablet Take 1,000 Units by mouth daily.   Yes [provider]  citalopram (CELEXA) 20 MG tablet Take 1 tablet (20 mg total) by mouth daily with breakfast. 12/17/22  Yes Eappen, Ria Clock, MD  cyanocobalamin (VITAMIN B12) 1000 MCG/ML injection  04/22/22  Yes [provider]  ferrous sulfate 325 (65 FE) MG tablet Take 1 tablet (325 mg total) by mouth 2 (two) times daily with a meal. 11/13/22  Yes Will Bonnet, MD  ibuprofen (ADVIL) 600 MG tablet Take 1 tablet (600 mg total) by mouth every 6 (six) hours as needed for mild pain or cramping. 11/13/22  Yes Will Bonnet, MD  Prenatal Vit-Fe Fumarate-FA (PRENATAL  MULTIVITAMIN) TABS tablet Take 1 tablet by mouth daily at 12 noon.    [provider]    Family History Family History  Problem Relation Age of Onset   Cancer Mother 60       breast / died at 49 in 01-16-10   Hypertension Mother    Depression Mother    Arthritis Father    Depression Father        committed suicide in 01/17/2015   Hypertension Father    Hypertension Sister    Heart disease Sister        heart murmur   Birth defects Sister    Depression Sister    Arthritis Maternal Grandmother    Other Maternal Grandmother        elevated tau protein; had corticobasal degneration    Cancer Paternal Grandmother        lung   Emphysema Paternal Grandfather     Social History Social History   Tobacco Use   Smoking status: Never   Smokeless tobacco: Never  Vaping Use   Vaping Use: Never used  Substance Use Topics   Alcohol use: Yes    Comment: social   Drug use: No     Allergies   Cefadroxil, Fire ant, and Septra [sulfamethoxazole-trimethoprim]   Review of Systems Review of Systems  HENT:  Positive for ear pain, sinus pressure and sore throat.   Neurological:  Positive for headaches.     Physical Exam Triage Vital Signs ED Triage Vitals  Enc Vitals Group     BP 12/27/22 1119 113/80     Pulse Rate 12/27/22 1119 65     Resp 12/27/22 1119 16     Temp 12/27/22 1119 98.6 F (37 C)     Temp Source 12/27/22 1119 Oral     SpO2 12/27/22 1119 98 %     Weight 12/27/22 1118 124 lb (56.2 kg)     Height 12/27/22 1118 5\' 4"  (1.626 m)     Head Circumference --      Peak Flow --      Pain Score 12/27/22 1118 6     Pain Loc --      Pain Edu? --      Excl. in [place]? --    No data found.  Updated Vital Signs BP 113/80 (BP Location: Left Arm)   Pulse 65   Temp 98.6 F (37 C) (Oral)   Resp 16   Ht 5\' 4"  (1.626 m)   Wt 124 lb (56.2 kg)   LMP  (LMP Unknown)   SpO2 98%   BMI 21.28 kg/m   Visual Acuity Right Eye Distance:   Left Eye  Distance:   Bilateral  Distance:    Right Eye Near:   Left Eye Near:    Bilateral Near:     Physical Exam Vitals and nursing note reviewed.  Constitutional:      General: She is not in acute distress.    Appearance: She is well-developed. She is not ill-appearing.  HENT:     Head: Normocephalic and atraumatic.     Right Ear: Tympanic membrane and ear canal normal.     Left Ear: Tympanic membrane and ear canal normal.     Nose: No congestion or rhinorrhea.     Mouth/Throat:     Mouth: Mucous membranes are moist.     Pharynx: Oropharynx is clear. Uvula midline. Posterior oropharyngeal erythema present.     Tonsils: No tonsillar exudate or tonsillar abscesses.  Eyes:     Conjunctiva/sclera: Conjunctivae normal.     Pupils: Pupils are equal, round, and reactive to light.  Cardiovascular:     Rate and Rhythm: Normal rate and regular rhythm.     Heart sounds: Normal heart sounds.  Pulmonary:     Effort: Pulmonary effort is normal.     Breath sounds: Normal breath sounds.  Musculoskeletal:     Cervical back: Normal range of motion and neck supple.  Lymphadenopathy:     Cervical: No cervical adenopathy.  Skin:    General: Skin is warm and dry.  Neurological:     General: No focal deficit present.     Mental Status: She is alert and oriented to person, place, and time.  Psychiatric:        Mood and Affect: Mood normal.        Behavior: Behavior normal.      UC Treatments / Results  Labs (all labs ordered are listed, but only abnormal results are displayed) Labs Reviewed  GROUP A STREP BY PCR  RESP PANEL BY RT-PCR (RSV, FLU A&B, COVID)  [id number]    EKG   Radiology No results found.  Procedures Procedures (including critical care time)  Medications Ordered in UC Medications - No data to display  Initial Impression / Assessment and Plan / UC Course  I have reviewed the triage vital signs and the nursing notes.  Pertinent labs & imaging results that were available during my care of the  patient were reviewed by me and considered in my medical decision making (see chart for details).     Strep, COVID, flu, RSV Discussed viral illness and symptomatic treatment Follow-up with PCP if symptoms do not improve ER precautions reviewed and patient verbalized understanding Final Clinical Impressions(s) / UC Diagnoses   Final diagnoses:  Sore throat   Discharge Instructions   None    ED Prescriptions   None    PDMP not reviewed this encounter.   Melynda Ripple, NP 12/27/22 1325

## 2023-01-01 ENCOUNTER — Ambulatory Visit (INDEPENDENT_AMBULATORY_CARE_PROVIDER_SITE_OTHER): Payer: BC Managed Care – PPO | Admitting: Licensed Clinical Social Worker

## 2023-01-01 DIAGNOSIS — F411 Generalized anxiety disorder: Secondary | ICD-10-CM | POA: Diagnosis not present

## 2023-01-01 NOTE — Progress Notes (Signed)
THERAPIST PROGRESS NOTE  Session Time: [id number]  [place] in office visit for patient and LCSW clinician  Participation Level: Active  Behavioral Response: Neat and Well GroomedAlertAnxious  Type of Therapy: Individual Therapy  Treatment Goals addressed:Learn and implement coping skills that result in a reduction of anxiety and worry, and improve daily functioning per pt report 3 out of 5 sessions documented   Reduce overall frequency, intensity, and duration of the anxiety so that daily functioning is not impaired per pt self report 3 out of 5 sessions documented.    ProgressTowards Goals: Progressing  Interventions: CBT and Motivational Interviewing  Summary: Chelsea Duncan is a 37 y.o. female who presents with improving symptoms related to anxiety diagnosis.   Allowed pt to explore and express thoughts and feelings associated with recent life situations and external stressors.  Patient identifies ongoing family conflict as a significant external stressor at time of session.  Allow patient to explore current concerns, and to identify specific situations and details that have been triggers.  Patient reports that she and her husband made the decision to start family/couples counseling.  Patient recognizes that she often has concerns and issues with her mother-in-law, and will use her husband as the " communicator" between herself and her mother-in-law.  Patient wanted to explore why she often feels so frustrated with her mother-in-law when her mother-in-law is very controlling.  Allowed patient to identify her own personality--patient states that she is very achievement oriented, very type A personality, and this has worked well for her in her career.  Patient states that she lost her parents at a very young age, so she had to step up, be strong, and be independent.  Discussed mother-in-law's individual personality, and how this may clash with patient's self  identified strong personality.  Discussed structural and strategic family dynamics, and provided patient with notes that were taken throughout session.  Discussed concept of enmeshment, and encouraged patient to educate herself about it and recognizes that she cannot change relationships within her extended family, but it is okay for her to set limits and boundaries for herself.  Continued recommendations are as follows: self care behaviors, positive social engagements, focusing on overall work/home/life balance, and focusing on positive physical and emotional wellness.  .  Suicidal/Homicidal: No  Therapist Response: Pt is continuing to apply interventions learned in session into daily life situations. Pt is currently on track to meet goals utilizing interventions mentioned above. Personal growth and progress noted. Treatment to continue as indicated.   Patient is continuing to do a good job of identifying triggers and utilizing coping skills to manage symptoms.  Patient is demonstrating signs of improvement in the areas of recognizing family dynamics, and making positive changes to improve relational functioning  Plan: Return again in 4 weeks.  Diagnosis: Encounter Diagnosis  Name Primary?   GAD (generalized anxiety disorder) Yes    Collaboration of Care: Other pt encouraged to continue with psychiatrist of record, Dr. Shea Evans  Patient/Guardian was advised Release of Information must be obtained prior to any record release in order to collaborate their care with an outside provider. Patient/Guardian was advised if they have not already done so to contact the registration department to sign all necessary forms in order for [place] to release information regarding their care.   Consent: Patient/Guardian gives verbal consent for treatment and assignment of benefits for services provided during this visit. Patient/Guardian expressed understanding and agreed to proceed.   Active     Anxiety  LTG: Reduce overall frequency, intensity, and duration of the anxiety so that daily functioning is not impaired per pt self report 3 out of 5 sessions documented.   (Progressing)     Start:  12/05/22    Expected End:  01/01/24         STG: Learn and implement coping skills that result in a reduction of anxiety and worry, and improve daily functioning per pt report 3 out of 5 sessions documented  (Progressing)     Start:  12/05/22    Expected End:  01/01/24         Work with Caryl Pina to track symptoms, triggers, and/or skill use through a mood chart, diary card, or journal     Start:  12/05/22       Intervention Note     Reviewed with patient during session.          Work with Caryl Pina to identify 3 personal goals for managing their anxiety to work on during current treatment.      Start:  12/05/22       Intervention Note     Reviewed with patient during session.          Perform motivational interviewing regarding use of tools     Start:  12/05/22       Intervention Note     Reviewed with patient during session.             [place], LCSW 01/01/2023

## 2023-01-10 DIAGNOSIS — D509 Iron deficiency anemia, unspecified: Secondary | ICD-10-CM | POA: Insufficient documentation

## 2023-01-10 DIAGNOSIS — R4586 Emotional lability: Secondary | ICD-10-CM | POA: Insufficient documentation

## 2023-01-21 ENCOUNTER — Telehealth (INDEPENDENT_AMBULATORY_CARE_PROVIDER_SITE_OTHER): Payer: BC Managed Care – PPO | Admitting: Psychiatry

## 2023-01-21 ENCOUNTER — Encounter: Payer: Self-pay | Admitting: Psychiatry

## 2023-01-21 DIAGNOSIS — F411 Generalized anxiety disorder: Secondary | ICD-10-CM

## 2023-01-21 DIAGNOSIS — F4010 Social phobia, unspecified: Secondary | ICD-10-CM | POA: Diagnosis not present

## 2023-01-21 DIAGNOSIS — F4381 Prolonged grief disorder: Secondary | ICD-10-CM | POA: Diagnosis not present

## 2023-01-21 DIAGNOSIS — G4709 Other insomnia: Secondary | ICD-10-CM

## 2023-01-21 NOTE — Progress Notes (Signed)
Virtual Visit via Video Note  I connected with Chelsea Duncan on 01/21/23 at 10:30 AM EDT by a video enabled telemedicine application and verified that I am speaking with the correct person using two identifiers.  Location Provider Location : [facility] Patient Location : Car  Participants: Patient , Provider    I discussed the limitations of evaluation and management by telemedicine and the availability of in person appointments. The patient expressed understanding and agreed to proceed.   I discussed the assessment and treatment plan with the patient. The patient was provided an opportunity to ask questions and all were answered. The patient agreed with the plan and demonstrated an understanding of the instructions.   The patient was advised to call back or seek an in-person evaluation if the symptoms worsen or if the condition fails to improve as anticipated.                                                                                       BH MD OP Progress Note  01/21/2023 1:22 PM [name]  MRN:  [id number]  Chief Complaint:  Chief Complaint  Patient presents with   Follow-up   Medication Refill   Depression   Anxiety   HPI: Chelsea Duncan is a 37 year old Caucasian female, married, currently employed, lives in [place], has a history of GAD, social anxiety disorder, bereavement, insomnia ,postpartum, date of delivery-November 10, 2022, presented for medication management.  Patient today reported she is currently driving her car to pick up her newborn who is currently sick and needs to take him to the pediatrician.  Patient was able to pull over to the side and park her vehicle for the evaluation however reported that she was in a hurry.  Patient today appeared to be alert, oriented to person place time situation.  She reports she was able to start back at work a week ago.  The current structure of being back at work has helped her.  She reports her mood symptoms are currently  better on the higher dosage of Celexa.  Denies any significant anxiety or depression.  She is managing her situational stressors better than before.  Sleep continues to be affected since the baby needs help at night.  She however gets around 5 hours of sleep at night.  She also has support from her husband.  Patient denies any suicidality, homicidality or perceptual disturbances.  Patient is motivated to stay in psychotherapy and has upcoming appointment with [name].  Patient denies any other concerns today.     Visit Diagnosis:    ICD-10-CM   1. GAD (generalized anxiety disorder)  F41.1     2. 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 Other insomnia  G47.09    Due to having a newborn      Past Psychiatric History: I have reviewed past psychiatric history from progress note on 10/11/2021.  Past trials of medications like Zoloft, Celexa, Xanax.  Reviewed neuropsychological testing by Dr.Allison Bray-patient does not meet criteria for ADHD-dated 03/29/2019 - 04/20/2019.  Past Medical History:  Past Medical  History:  Diagnosis Date   Allergy    Anxiety    Bone tumor (benign)    tumor vs cyst removed btwn age [age] y.o.    BRCA negative 03/2016   MyRisk neg except NBN VUS   Chicken pox    Complication of anesthesia    COVID-19    12/2019   Depression during pregnancy 08/29/2020   Family history of breast cancer 03/2016   IBIS=18.4%  She has been tested for at least BRCA and was negative 02/07/16). Tyrer-Cuzick (lifetime) was ~18%.    PONV (postoperative nausea and vomiting)    Pyelonephritis    S/P skin biopsy    neck normal    UTI (urinary tract infection)     Past Surgical History:  Procedure Laterality Date   BONE CYST EXCISION     right foot.   LAPAROSCOPY N/A 11/12/2022   Procedure: LAPAROSCOPY DIAGNOSTIC;  Surgeon: Conard Novak, MD;  Location: [facility];  Service: Gynecology;  Laterality: N/A;    TOTAL LAPAROSCOPIC HYSTERECTOMY WITH BILATERAL SALPINGO OOPHORECTOMY N/A 11/12/2022   Procedure: LAPAROSCOPIC BILATERAL SALPINGECTOMY, REAPAIR OF LEFT TUBE AVULSION, EVACUATION OF HEMOPERITONEUM;  Surgeon: Conard Novak, MD;  Location: [facility];  Service: Gynecology;  Laterality: N/A;   TUBAL LIGATION N/A 11/11/2022   Procedure: POST PARTUM TUBAL LIGATION;  Surgeon: Conard Novak, MD;  Location: [facility];  Service: Gynecology;  Laterality: N/A;    Family Psychiatric History: Reviewed family psychiatric history from progress note on 10/11/2021.  Family History:  Family History  Problem Relation Age of Onset   Cancer Mother 12       breast / died at 8 in 02/06/10   Hypertension Mother    Depression Mother    Arthritis Father    Depression Father        committed suicide in 02-07-15   Hypertension Father    Hypertension Sister    Heart disease Sister        heart murmur   Birth defects Sister    Depression Sister    Arthritis Maternal Grandmother    Other Maternal Grandmother        elevated tau protein; had corticobasal degneration    Cancer Paternal Grandmother        lung   Emphysema Paternal Grandfather     Social History: Reviewed social history from progress note on 10/11/2021. Social History   Socioeconomic History   Marital status: Married    Spouse name: [email or web address]   Number of children: 3   Years of education: Not on file   Highest education level: Master's degree (e.g., MA, MS, MEng, MEd, MSW, MBA)  Occupational History   Not on file  Tobacco Use   Smoking status: Never   Smokeless tobacco: Never  Vaping Use   Vaping Use: Never used  Substance and Sexual Activity   Alcohol use: Yes    Comment: social   Drug use: No   Sexual activity: Yes    Partners: Male    Birth control/protection: Surgical  Other Topics Concern   Not on file  Social History Narrative   Married 3 boys    Grew up in [place]    1 older sister 2 years older than her    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   Social Determinants of Health   Financial Resource Strain: Not on file  Food Insecurity: No Food Insecurity (11/10/2022)   Hunger Vital  Sign    Worried About Programme researcher, broadcasting/film/video in the Last Year: Never true    Ran Out of Food in the Last Year: Never true  Transportation Needs: No Transportation Needs (11/10/2022)   PRAPARE - Administrator, Civil Service (Medical): No    Lack of Transportation (Non-Medical): No  Physical Activity: Not on file  Stress: Not on file  Social Connections: Not on file    Allergies:  Allergies  Allergen Reactions   Cefadroxil Nausea And Vomiting   Fire Ant Other (See Comments)    anaphylaxis anaphylaxis   Septra [Sulfamethoxazole-Trimethoprim] Nausea And Vomiting    Metabolic Disorder Labs: No results found for: "HGBA1C", "MPG" No results found for: "PROLACTIN" Lab Results  Component Value Date   CHOL 121 04/20/2021   TRIG 98.0 04/20/2021   HDL 34.40 (L) 04/20/2021   CHOLHDL 4 04/20/2021   VLDL 19.6 04/20/2021   LDLCALC 67 04/20/2021   LDLCALC 76 10/27/2018   Lab Results  Component Value Date   TSH 1.54 08/29/2021   TSH 3.10 04/20/2021    Therapeutic Level Labs: No results found for: "LITHIUM" No results found for: "VALPROATE" No results found for: "CBMZ"  Current Medications: Current Outpatient Medications  Medication Sig Dispense Refill   Ascorbic Acid (VITAMIN C) 100 MG tablet Take 100 mg by mouth daily.     cholecalciferol (VITAMIN D3) 25 MCG (1000 UNIT) tablet Take 1,000 Units by mouth daily.     citalopram (CELEXA) 20 MG tablet Take 1 tablet (20 mg total) by mouth daily with breakfast. 90 tablet 0   cyanocobalamin (VITAMIN B12) 1000 MCG/ML injection      ferrous sulfate 325 (65 FE) MG tablet Take 1 tablet (325 mg total) by mouth 2 (two) times daily with a meal.  3   ibuprofen (ADVIL) 600 MG tablet Take 1 tablet (600 mg total) by mouth every 6  (six) hours as needed for mild pain or cramping. 30 tablet 0   No current facility-administered medications for this visit.     Musculoskeletal: Strength & Muscle Tone:  UTA Gait & Station:  Seated Patient leans: N/A  Psychiatric Specialty Exam: Review of Systems  Psychiatric/Behavioral:  Positive for sleep disturbance.   All other systems reviewed and are negative.    not currently breastfeeding.There is no height or weight on file to calculate BMI.  General Appearance: Casual  Eye Contact:  Fair  Speech:  Clear and Coherent  Volume:  Normal  Mood:  Euthymic  Affect:  Congruent  Thought Process:  Goal Directed and Descriptions of Associations: Intact  Orientation:  Full (Time, Place, and Person)  Thought Content: Logical   Suicidal Thoughts:  No  Homicidal Thoughts:  No  Memory:  Immediate;   Fair Recent;   Fair Remote;   Fair  Judgement:  Fair  Insight:  Good  Psychomotor Activity:  Normal  Concentration:  Concentration: Fair and Attention Span: Fair  Recall:  [employer] of Knowledge: Fair  Language: Fair  Akathisia:  No  Handed:  Right  AIMS (if indicated): not done  Assets:  Communication Skills Desire for Improvement Housing Social Support  ADL's:  Intact  Cognition: WNL  Sleep:   Restless due to having a newborn   Screenings: AIMS    Flowsheet Row Video Visit from [facility]/17/2022 in [facility] Video Visit from 04/01/2022 in [facility]  AIMS Total Score 0 0  AUDIT    Flowsheet Row Office Visit from 06/11/2017 in [facility]  Alcohol Use Disorder Identification Test Final Score (AUDIT) 0      GAD-7    Flowsheet Row Video Visit from 07/26/2022 in [facility] Video Visit from 05/17/2022 in [facility] Video Visit from 04/01/2022 in [facility] Video Visit from 12/31/2021 in [facility] Counselor from 11/09/2021 in [facility]  Total GAD-7 Score 13 3 1 7 14       PHQ2-9    Flowsheet Row Video Visit from 09/10/2022 in [facility] Counselor from 08/27/2022 in [facility] Video Visit from 07/26/2022 in [facility] Video Visit from 05/17/2022 in [facility] Video Visit from 04/01/2022 in [facility]  PHQ-2 Total Score 0 0 0 0 0  PHQ-9 Total Score 4 -- -- -- --      Flowsheet Row Video Visit from 01/21/2023 in [facility] ED from 12/27/2022 in [facility]  Video Visit from 12/17/2022 in [facility]  C-SSRS RISK CATEGORY No Risk No Risk No Risk        Assessment and Plan: Jancie Kercher is a 37 year old Caucasian female, married, lives in [place], has a history of social anxiety, MDD, bereavement, insomnia was evaluated by telemedicine today.  Patient is currently improving, will benefit from the following plan.  Plan GAD-improving  Celexa 20 mg p.o. daily Continue CBT with [name]  Social anxiety disorder-improving Celexa 20 mg p.o. daily Continue CBT  Insomnia-restless due to caring for a newborn Continue to work on sleep hygiene.  Patient to make use of support system.  Bereavement disorder-improving Continue CBT  Follow-up in clinic in 3 months or sooner if needed.   Collaboration of Care: Collaboration of Care: Other I have reviewed notes from therapist-Ms. Christina-dated 01/01/2023- currently working on CBT and motivational interviewing.  Patient/Guardian was advised Release of Information must be obtained prior to  any record release in order to collaborate their care with an outside provider. Patient/Guardian was advised if they have not already done so to contact the registration department to sign all necessary forms in order for [place] to release information regarding their care.   Consent: Patient/Guardian gives verbal consent for treatment and assignment of benefits for services provided during this visit. Patient/Guardian expressed understanding and agreed to proceed.   This note was generated in part or whole with voice recognition software. Voice recognition is usually quite accurate but there are transcription errors that can and very often do occur. I apologize for any typographical errors that were not detected and corrected.    Jomarie Longs, MD 01/21/2023, 1:22 PM

## 2023-02-06 ENCOUNTER — Ambulatory Visit (HOSPITAL_COMMUNITY): Payer: BC Managed Care – PPO | Admitting: Licensed Clinical Social Worker

## 2023-03-18 ENCOUNTER — Other Ambulatory Visit: Payer: Self-pay | Admitting: Psychiatry

## 2023-03-18 DIAGNOSIS — F4010 Social phobia, unspecified: Secondary | ICD-10-CM

## 2023-03-18 DIAGNOSIS — F411 Generalized anxiety disorder: Secondary | ICD-10-CM

## 2023-04-14 ENCOUNTER — Telehealth: Payer: Self-pay | Admitting: Psychiatry

## 2023-04-14 NOTE — Telephone Encounter (Signed)
6/24 & 7/8 message left answering machine and sent my chart message to reschedule

## 2023-04-25 ENCOUNTER — Ambulatory Visit: Payer: BC Managed Care – PPO | Admitting: Psychiatry

## 2023-04-25 ENCOUNTER — Telehealth: Payer: BC Managed Care – PPO | Admitting: Psychiatry

## 2023-05-06 ENCOUNTER — Telehealth (INDEPENDENT_AMBULATORY_CARE_PROVIDER_SITE_OTHER): Payer: BC Managed Care – PPO | Admitting: Psychiatry

## 2023-05-06 ENCOUNTER — Encounter: Payer: Self-pay | Admitting: Psychiatry

## 2023-05-06 DIAGNOSIS — F4381 Prolonged grief disorder: Secondary | ICD-10-CM

## 2023-05-06 DIAGNOSIS — F4010 Social phobia, unspecified: Secondary | ICD-10-CM

## 2023-05-06 DIAGNOSIS — F411 Generalized anxiety disorder: Secondary | ICD-10-CM

## 2023-05-06 DIAGNOSIS — G4709 Other insomnia: Secondary | ICD-10-CM

## 2023-05-06 NOTE — Progress Notes (Signed)
Virtual Visit via Video Note  I connected with Chelsea Duncan on 05/06/23 at 11:30 AM EDT by a video enabled telemedicine application and verified that I am speaking with the correct person using two identifiers.  Location Provider Location : [facility] Patient Location : Work  Participants: Patient , Provider    I discussed the limitations of evaluation and management by telemedicine and the availability of in person appointments. The patient expressed understanding and agreed to proceed.    I discussed the assessment and treatment plan with the patient. The patient was provided an opportunity to ask questions and all were answered. The patient agreed with the plan and demonstrated an understanding of the instructions.   The patient was advised to call back or seek an in-person evaluation if the symptoms worsen or if the condition fails to improve as anticipated.    BH MD OP Progress Note  05/06/2023 12:00 PM Chelsea Duncan  MRN:  [id number]  Chief Complaint:  Chief Complaint  Patient presents with   Follow-up   Anxiety   Depression   Medication Refill   HPI: Chelsea Duncan is a 37 year old Caucasian female, married, currently employed, lives in [place] with her spouse, has a history of GAD, social anxiety disorder, bereavement , insomnia, currently postpartum 5 months, presented for medication management and was evaluated by telemedicine today.  Patient today reports she is currently doing well on the current dosage of Celexa.  Denies having any episodes of anxiety, irritability or mood swings.  Denies any sadness, crying spells.  Reports she is enjoying her work.  She transitioned to a new job position at work and is currently doing well.  She looks forward to family activities that she is planning to do the rest of the summer.  She reports she is able to sleep through the night.  Although her baby wakes her up she is able to fall back asleep immediately.  Patient reports she is  motivated to stay in therapy.  She will reach out to her therapist.  Patient denies any suicidality, homicidality or perceptual disturbances.  Patient denies any other concerns today.  Visit Diagnosis:    ICD-10-CM   1. GAD (generalized anxiety disorder)  F41.1     2. 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 Other insomnia  G47.09    has a newborn      Past Psychiatric History: I have reviewed past psychiatric history from progress note on 10/11/2021.  Past trials of medications like Zoloft, Celexa, Xanax.  Reviewed neuropsychological testing by Dr. Althia Forts not meet criteria for ADHD-dated 03/29/2019 - 04/20/2019.  Past Medical History:  Past Medical History:  Diagnosis Date   Allergy    Anxiety    Bone tumor (benign)    tumor vs cyst removed btwn age [age] y.o.    BRCA negative 03/2016   MyRisk neg except NBN VUS   Chicken pox    Complication of anesthesia    COVID-19    12/2019   Depression during pregnancy 08/29/2020   Family history of breast cancer 03/2016   IBIS=18.4%  She has been tested for at least BRCA and was negative (~2017). Tyrer-Cuzick (lifetime) was ~18%.    PONV (postoperative nausea and vomiting)    Pyelonephritis    S/P skin biopsy    neck normal    UTI (urinary tract infection)     Past Surgical History:  Procedure Laterality Date  BONE CYST EXCISION     right foot.   LAPAROSCOPY N/A 11/12/2022   Procedure: LAPAROSCOPY DIAGNOSTIC;  Surgeon: Conard Novak, MD;  Location: [facility];  Service: Gynecology;  Laterality: N/A;   TOTAL LAPAROSCOPIC HYSTERECTOMY WITH BILATERAL SALPINGO OOPHORECTOMY N/A 11/12/2022   Procedure: LAPAROSCOPIC BILATERAL SALPINGECTOMY, REAPAIR OF LEFT TUBE AVULSION, EVACUATION OF HEMOPERITONEUM;  Surgeon: Conard Novak, MD;  Location: [facility];  Service: Gynecology;  Laterality: N/A;   TUBAL LIGATION N/A 11/11/2022   Procedure: POST PARTUM TUBAL LIGATION;  Surgeon:  Conard Novak, MD;  Location: [facility];  Service: Gynecology;  Laterality: N/A;    Family Psychiatric History: I have reviewed family psychiatric history from progress note on 10/11/2021.  Family History:  Family History  Problem Relation Age of Onset   Cancer Mother 54       breast / died at 73 in Jun 06, 2010   Hypertension Mother    Depression Mother    Arthritis Father    Depression Father        committed suicide in 07-Jun-2015   Hypertension Father    Hypertension Sister    Heart disease Sister        heart murmur   Birth defects Sister    Depression Sister    Arthritis Maternal Grandmother    Other Maternal Grandmother        elevated tau protein; had corticobasal degneration    Cancer Paternal Grandmother        lung   Emphysema Paternal Grandfather     Social History: I have reviewed social history from progress note on 10/11/2021. Social History   Socioeconomic History   Marital status: Married    Spouse name: [email or web address]   Number of children: 3   Years of education: Not on file   Highest education level: Master's degree (e.g., MA, MS, MEng, MEd, MSW, MBA)  Occupational History   Not on file  Tobacco Use   Smoking status: Never   Smokeless tobacco: Never  Vaping Use   Vaping status: Never Used  Substance and Sexual Activity   Alcohol use: Yes    Comment: social   Drug use: No   Sexual activity: Yes    Partners: Male    Birth control/protection: Surgical  Other Topics Concern   Not on file  Social History Narrative   Married 3 boys    Grew up in [place]    1 older sister 2 years older than her    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   Social Determinants of Health   Financial Resource Strain: Not on file  Food Insecurity: No Food Insecurity (11/10/2022)   Hunger Vital Sign    Worried About Running Out of Food in the Last Year: Never true    Ran Out of Food in the Last Year: Never true  Transportation  Needs: No Transportation Needs (11/10/2022)   PRAPARE - Administrator, Civil Service (Medical): No    Lack of Transportation (Non-Medical): No  Physical Activity: Not on file  Stress: Not on file  Social Connections: Not on file    Allergies:  Allergies  Allergen Reactions   Cefadroxil Nausea And Vomiting   Fire Ant Other (See Comments)    anaphylaxis anaphylaxis   Septra [Sulfamethoxazole-Trimethoprim] Nausea And Vomiting    Metabolic Disorder Labs: No results found for: "HGBA1C", "MPG" No results found for: "PROLACTIN" Lab Results  Component Value Date  CHOL 121 04/20/2021   TRIG 98.0 04/20/2021   HDL 34.40 (L) 04/20/2021   CHOLHDL 4 04/20/2021   VLDL 19.6 04/20/2021   LDLCALC 67 04/20/2021   LDLCALC 76 10/27/2018   Lab Results  Component Value Date   TSH 1.54 08/29/2021   TSH 3.10 04/20/2021    Therapeutic Level Labs: No results found for: "LITHIUM" No results found for: "VALPROATE" No results found for: "CBMZ"  Current Medications: Current Outpatient Medications  Medication Sig Dispense Refill   Ascorbic Acid (VITAMIN C) 100 MG tablet Take 100 mg by mouth daily.     cholecalciferol (VITAMIN D3) 25 MCG (1000 UNIT) tablet Take 1,000 Units by mouth daily.     citalopram (CELEXA) 20 MG tablet TAKE 1 TABLET BY MOUTH DAILY WITH BREAKFAST 90 tablet 0   cyanocobalamin (VITAMIN B12) 1000 MCG/ML injection      ferrous sulfate 325 (65 FE) MG tablet Take 1 tablet (325 mg total) by mouth 2 (two) times daily with a meal.  3   ibuprofen (ADVIL) 600 MG tablet Take 1 tablet (600 mg total) by mouth every 6 (six) hours as needed for mild pain or cramping. 30 tablet 0   No current facility-administered medications for this visit.     Musculoskeletal: Strength & Muscle Tone:  UTA Gait & Station: normal Patient leans: N/A  Psychiatric Specialty Exam: Review of Systems  Psychiatric/Behavioral: Negative.      not currently breastfeeding.There is no height or  weight on file to calculate BMI.  General Appearance: Fairly Groomed  Eye Contact:  Fair  Speech:  Normal Rate  Volume:  Normal  Mood:  Euthymic  Affect:  Congruent  Thought Process:  Goal Directed and Descriptions of Associations: Intact  Orientation:  Full (Time, Place, and Person)  Thought Content: Logical   Suicidal Thoughts:  No  Homicidal Thoughts:  No  Memory:  Immediate;   Fair Recent;   Fair Remote;   Fair  Judgement:  Fair  Insight:  Fair  Psychomotor Activity:  Normal  Concentration:  Concentration: Fair and Attention Span: Fair  Recall:  [employer] of Knowledge: Fair  Language: Fair  Akathisia:  No  Handed:  Right  AIMS (if indicated): not done  Assets:  Communication Skills Desire for Improvement Housing Social Support  ADL's:  Intact  Cognition: WNL  Sleep:  Fair   Screenings: AIMS    Flowsheet Row Video Visit from 05/17/2022 in [facility] Video Visit from 04/01/2022 in [facility]  AIMS Total Score 0 0      AUDIT    Flowsheet Row Office Visit from 06/11/2017 in [facility]  Alcohol Use Disorder Identification Test Final Score (AUDIT) 0      GAD-7    Flowsheet Row Video Visit from 07/26/2022 in [facility] Video Visit from 05/17/2022 in [facility] Video Visit from 04/01/2022 in [facility] Video Visit from 12/31/2021 in [facility] Counselor from 11/09/2021 in [facility]  Total GAD-7 Score 13 3 1 7 14       PHQ2-9    Flowsheet Row Video Visit from 09/10/2022 in [facility] Counselor from 08/27/2022 in [facility] Video Visit from 07/26/2022 in [facility] Video Visit from 05/17/2022 in [facility] Video Visit from 04/01/2022 in [facility]  PHQ-2 Total Score 0 0 0 0 0  PHQ-9 Total Score 4 -- -- -- --      Flowsheet Row Video Visit from 05/06/2023 in [facility] Video Visit from 01/21/2023 in [facility] ED from 12/27/2022 in [facility] at [facility]   C-SSRS RISK CATEGORY No Risk No Risk No Risk        Assessment and Plan: Chelsea Duncan is a 37 year old Caucasian female, married, lives in [place], has a history of social anxiety, MDD, insomnia , postpartum 5 months was evaluated by telemedicine today.  Patient is currently stable on the current medication regimen.  Plan GAD-stable Celexa 20 mg p.o. daily Continue CBT, patient advised to reach out to her therapist.  Social anxiety disorder-stable Celexa 20 mg p.o. daily Continue CBT  Insomnia-improving Patient to work on sleep hygiene and make use of social support system.  Bereavement-stable Continue CBT as needed  Follow-up in clinic in 3 to 4 months or sooner in person.  Collaboration of Care: Collaboration of Care: Referral or follow-up with counselor/therapist AEB encouraged to follow up with therapist.  Patient/Guardian was advised Release of Information must be obtained prior to any record release in order to collaborate their care with an outside provider. Patient/Guardian was advised if they have not already done so to contact the registration department to sign all necessary forms in order for [place] to release information regarding their care.   Consent: Patient/Guardian gives verbal consent for treatment and assignment of benefits for services provided during this visit. Patient/Guardian expressed understanding and agreed to proceed.   This note was generated in part or whole with voice  recognition software. Voice recognition is usually quite accurate but there are transcription errors that can and very often do occur. I apologize for any typographical errors that were not detected and corrected.     Chelsea Longs, MD 05/06/2023, 12:00 PM

## 2023-05-06 NOTE — Patient Instructions (Signed)
  [email or web address]  www.psychologytoday  [employer] [email or web address] [address] [place], [place] [address]   [phone]  [facility] [email or web address] [address] [place], [place] [address]  [phone]   [facility] - [phone]  [facility] - [phone]  [facility] - [phone] counseling [phone]  [facility] [phone]   [email or web address] this clinician can offer telehealth and has a sliding scale option  [email or web address] this group also offers sliding scale rates and is based out of [place]   [employer] [facility] has interns who offer sliding scale rates and some of the full time clinicians do, as well. You complete their contact form on their website and the referrals coordinator will help to get connected to someone   Medicaid below :  [facility] [address] [place], [place] [address]  [phone]    [employer] [address] [place], [place] [address]  [phone]    [facility] [address] [place], [place] [address]  [phone]

## 2023-06-21 ENCOUNTER — Other Ambulatory Visit: Payer: Self-pay | Admitting: Psychiatry

## 2023-06-21 DIAGNOSIS — F4010 Social phobia, unspecified: Secondary | ICD-10-CM

## 2023-06-21 DIAGNOSIS — F411 Generalized anxiety disorder: Secondary | ICD-10-CM

## 2023-08-26 ENCOUNTER — Encounter: Payer: Self-pay | Admitting: Psychiatry

## 2023-08-26 ENCOUNTER — Ambulatory Visit: Payer: BC Managed Care – PPO | Admitting: Psychiatry

## 2023-08-26 VITALS — BP 122/84 | HR 71 | Temp 98.2°F | Ht 64.0 in

## 2023-08-26 DIAGNOSIS — F411 Generalized anxiety disorder: Secondary | ICD-10-CM

## 2023-08-26 DIAGNOSIS — F4010 Social phobia, unspecified: Secondary | ICD-10-CM

## 2023-08-26 DIAGNOSIS — G4709 Other insomnia: Secondary | ICD-10-CM | POA: Diagnosis not present

## 2023-08-26 DIAGNOSIS — F4381 Prolonged grief disorder: Secondary | ICD-10-CM | POA: Diagnosis not present

## 2023-08-26 NOTE — Progress Notes (Unsigned)
BH MD OP Progress Note  08/26/2023 1:58 PM Chelsea Duncan  MRN:  [id number]  Chief Complaint:  Chief Complaint  Patient presents with   Follow-up   Depression   Anxiety   Medication Refill   HPI: Chelsea Duncan is a 37 year old Caucasian female, married, currently employed, lives in [place] with her spouse, has a history of GAD, social anxiety disorder, bereavement, insomnia, was evaluated in office today.  Patient reports she is overall doing fairly well.  She is at this new job position and she is very happy with that.  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  She however believes that is who she is and has always been.  She reports now that she has 4 children it is not always easy to be a perfectionist.  She has accepted that although she continues to need to work on it.  Patient reports she has not had any significant anxiety or depression symptoms.  The Celexa 20 mg is beneficial.  She does worry whether it is causing weight gain however she does not want to taper off the medication for the same.  She is trying to incorporate exercise routine into her daily schedule.  She needs to watch her diet and is aware of that.  Patient reports sleep is improving.  Her baby does continue to need feeding 2-3 times at night which does affect her sleep.  Her baby is currently 32 months old.  So she is hoping he will start sleeping through the night soon.  Patient denies any suicidality, homicidality or perceptual disturbances.  Patient denies any other concerns today.  Visit Diagnosis:    ICD-10-CM   1. GAD (generalized anxiety disorder)  F41.1     2. 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 Other insomnia  G47.09    has an infant      Past Psychiatric History: I have reviewed past psychiatric history from progress note on 10/11/2021.   Past trials of medications like Zoloft, Celexa, Xanax.  Reviewed neuropsychological testing by Dr. Althia Forts not meet criteria for ADHD-dated 03/29/2019 - 04/20/2019.  Past Medical History:  Past Medical History:  Diagnosis Date   Allergy    Anxiety    Bone tumor (benign)    tumor vs cyst removed btwn age [age] y.o.    BRCA negative 03/2016   MyRisk neg except NBN VUS   Chicken pox    Complication of anesthesia    COVID-19    12/2019   Depression during pregnancy 08/29/2020   Family history of breast cancer 03/2016   IBIS=18.4%  She has been tested for at least BRCA and was negative (~2017). Tyrer-Cuzick (lifetime) was ~18%.    PONV (postoperative nausea and vomiting)    Pyelonephritis    S/P skin biopsy    neck normal    UTI (urinary tract infection)     Past Surgical History:  Procedure Laterality Date   BONE CYST EXCISION     right foot.   LAPAROSCOPY N/A 11/12/2022   Procedure: LAPAROSCOPY DIAGNOSTIC;  Surgeon: Conard Novak, MD;  Location: [facility];  Service: Gynecology;  Laterality: N/A;   TOTAL LAPAROSCOPIC HYSTERECTOMY WITH BILATERAL SALPINGO OOPHORECTOMY N/A 11/12/2022   Procedure: LAPAROSCOPIC BILATERAL SALPINGECTOMY, REAPAIR OF LEFT TUBE AVULSION, EVACUATION OF HEMOPERITONEUM;  Surgeon: Jean Rosenthal,  Mila Homer, MD;  Location: [facility];  Service: Gynecology;  Laterality: N/A;   TUBAL LIGATION N/A 11/11/2022   Procedure: POST PARTUM TUBAL LIGATION;  Surgeon: Conard Novak, MD;  Location: [facility];  Service: Gynecology;  Laterality: N/A;    Family Psychiatric History: I have reviewed family psychiatric history from progress note on 10/11/2021.  Family History:  Family History  Problem Relation Age of Onset   Cancer Mother 1       breast / died at 28 in 2010-09-11   Hypertension Mother    Depression Mother    Arthritis Father    Depression Father        committed suicide in Sep 12, 2015   Hypertension Father    Hypertension Sister    Heart disease Sister        heart  murmur   Birth defects Sister    Depression Sister    Arthritis Maternal Grandmother    Other Maternal Grandmother        elevated tau protein; had corticobasal degneration    Cancer Paternal Grandmother        lung   Emphysema Paternal Grandfather     Social History: I have reviewed social history from progress note on 10/11/2021. Social History   Socioeconomic History   Marital status: Married    Spouse name: matthew   Number of children: 3   Years of education: Not on file   Highest education level: Master's degree (e.g., MA, MS, MEng, MEd, MSW, MBA)  Occupational History   Not on file  Tobacco Use   Smoking status: Never   Smokeless tobacco: Never  Vaping Use   Vaping status: Never Used  Substance and Sexual Activity   Alcohol use: Yes    Comment: social   Drug use: No   Sexual activity: Yes    Partners: Male    Birth control/protection: Surgical  Other Topics Concern   Not on file  Social History Narrative   Married 3 boys    Grew up in [place]    1 older sister 2 years older than her    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   Social Determinants of Health   Financial Resource Strain: Not on file  Food Insecurity: No Food Insecurity (11/10/2022)   Hunger Vital Sign    Worried About Running Out of Food in the Last Year: Never true    Ran Out of Food in the Last Year: Never true  Transportation Needs: No Transportation Needs (11/10/2022)   PRAPARE - Administrator, Civil Service (Medical): No    Lack of Transportation (Non-Medical): No  Physical Activity: Not on file  Stress: Not on file  Social Connections: Not on file    Allergies:  Allergies  Allergen Reactions   Cefadroxil Nausea And Vomiting   Fire Ant Other (See Comments)    anaphylaxis anaphylaxis   Septra [Sulfamethoxazole-Trimethoprim] Nausea And Vomiting    Metabolic Disorder Labs: No results found for: "HGBA1C", "MPG" No  results found for: "PROLACTIN" Lab Results  Component Value Date   CHOL 121 04/20/2021   TRIG 98.0 04/20/2021   HDL 34.40 (L) 04/20/2021   CHOLHDL 4 04/20/2021   VLDL 19.6 04/20/2021   LDLCALC 67 04/20/2021   LDLCALC 76 10/27/2018   Lab Results  Component Value Date   TSH 1.54 08/29/2021   TSH 3.10 04/20/2021    Therapeutic Level Labs: No results found for: "  LITHIUM" No results found for: "VALPROATE" No results found for: "CBMZ"  Current Medications: Current Outpatient Medications  Medication Sig Dispense Refill   Ascorbic Acid (VITAMIN C) 100 MG tablet Take 100 mg by mouth daily.     cholecalciferol (VITAMIN D3) 25 MCG (1000 UNIT) tablet Take 1,000 Units by mouth daily.     citalopram (CELEXA) 20 MG tablet TAKE 1 TABLET BY MOUTH EVERY DAY WITH BREAKFAST 90 tablet 1   ferrous sulfate 325 (65 FE) MG tablet Take 1 tablet (325 mg total) by mouth 2 (two) times daily with a meal.  3   ibuprofen (ADVIL) 600 MG tablet Take 1 tablet (600 mg total) by mouth every 6 (six) hours as needed for mild pain or cramping. 30 tablet 0   cyanocobalamin (VITAMIN B12) 1000 MCG/ML injection  (Patient not taking: Reported on 08/26/2023)     No current facility-administered medications for this visit.     Musculoskeletal: Strength & Muscle Tone: within normal limits Gait & Station: normal Patient leans: N/A  Psychiatric Specialty Exam: Review of Systems  Psychiatric/Behavioral:  Positive for sleep disturbance.     Blood pressure 122/84, pulse 71, temperature 98.2 F (36.8 C), temperature source Temporal, height 5\' 4"  (1.626 m), last menstrual period 08/12/2023, SpO2 98%, not currently breastfeeding.Body mass index is 21.28 kg/m.  General Appearance: Casual  Eye Contact:  Good  Speech:  Normal Rate  Volume:  Normal  Mood:  Euthymic  Affect:  Full Range  Thought Process:  Goal Directed and Descriptions of Associations: Intact  Orientation:  Full (Time, Place, and Person)  Thought  Content: Logical   Suicidal Thoughts:  No  Homicidal Thoughts:  No  Memory:  Immediate;   Fair Recent;   Fair Remote;   Fair  Judgement:  Fair  Insight:  Fair  Psychomotor Activity:  Normal  Concentration:  Concentration: Fair and Attention Span: Fair  Recall:  [employer] of Knowledge: Fair  Language: Fair  Akathisia:  No  Handed:  Right  AIMS (if indicated): not done  Assets:  Communication Skills Desire for Improvement Housing Social Support Transportation  ADL's:  Intact  Cognition: WNL  Sleep:   Restless due to having an infant   Screenings: AIMS    Flowsheet Row Video Visit from 05/17/2022 in [facility] Video Visit from 04/01/2022 in [facility]  AIMS Total Score 0 0      AUDIT    Flowsheet Row Office Visit from 06/11/2017 in [facility]  Alcohol Use Disorder Identification Test Final Score (AUDIT) 0      GAD-7    Flowsheet Row Video Visit from 07/26/2022 in [facility] Video Visit from 05/17/2022 in [facility] Video Visit from 04/01/2022 in [facility] Video Visit from 12/31/2021 in [facility] Counselor from 11/09/2021 in [facility]  Total GAD-7 Score 13 3 1 7 14       PHQ2-9    Flowsheet Row Video Visit from 09/10/2022 in [facility] Counselor from 08/27/2022 in [facility] Video Visit from 07/26/2022 in [facility] Video Visit from 05/17/2022 in [facility] Video Visit from 04/01/2022 in [facility]  PHQ-2 Total Score 0 0 0 0 0  PHQ-9 Total Score 4 -- -- -- --  Flowsheet Row Video Visit from 05/06/2023 in [facility] Video Visit from 01/21/2023 in [facility] ED from 12/27/2022 in [facility] [place]   C-SSRS RISK CATEGORY No Risk No Risk No Risk        Assessment and Plan: Ceazia Patrick is a 37 year old Caucasian female, married, lives in [place], has a history of social anxiety, MDD, insomnia, postpartum 10 months was evaluated in office today.  Patient is currently stable.  Plan as noted below.  Plan GAD-stable Celexa 20 mg p.o. daily Continue CBT as needed  Social anxiety disorder-stable Celexa 20 milligram p.o. daily  Insomnia-improving Patient to work on sleep hygiene.  Persistent complex bereavement disorder-stable Will monitor closely.  Follow-up in clinic in 6 months or sooner if needed.   Consent: Patient/Guardian gives verbal consent for treatment and assignment of benefits for services provided during this visit. Patient/Guardian expressed understanding and agreed to proceed.   This note was generated in part or whole with voice recognition software. Voice recognition is usually quite accurate but there are transcription errors that can and very often do occur. I apologize for any typographical errors that were not detected and corrected.    Jomarie Longs, MD 08/26/2023, 1:58 PM

## 2023-10-24 ENCOUNTER — Ambulatory Visit: Payer: Self-pay | Admitting: Family Medicine

## 2023-10-24 ENCOUNTER — Encounter: Payer: Self-pay | Admitting: Family Medicine

## 2023-10-24 ENCOUNTER — Other Ambulatory Visit: Payer: Self-pay | Admitting: Family Medicine

## 2023-10-24 VITALS — BP 127/86 | HR 71 | Temp 98.1°F | Resp 20 | Ht 64.0 in | Wt 126.0 lb

## 2023-10-24 DIAGNOSIS — Z0000 Encounter for general adult medical examination without abnormal findings: Secondary | ICD-10-CM

## 2023-10-24 DIAGNOSIS — E538 Deficiency of other specified B group vitamins: Secondary | ICD-10-CM

## 2023-10-24 DIAGNOSIS — Z7689 Persons encountering health services in other specified circumstances: Secondary | ICD-10-CM

## 2023-10-24 DIAGNOSIS — F411 Generalized anxiety disorder: Secondary | ICD-10-CM

## 2023-10-24 DIAGNOSIS — E559 Vitamin D deficiency, unspecified: Secondary | ICD-10-CM

## 2023-10-24 DIAGNOSIS — Z803 Family history of malignant neoplasm of breast: Secondary | ICD-10-CM

## 2023-10-24 DIAGNOSIS — D508 Other iron deficiency anemias: Secondary | ICD-10-CM

## 2023-10-24 NOTE — Progress Notes (Signed)
New Patient Office Visit  Subjective:   Chelsea Duncan 02/23/1986 10/24/2023  Chief Complaint  Patient presents with   Medical Management of Chronic Issues    HPI: Rashanti Duncan presents today to establish care at [facility]. Introduced to Publishing rights manager role and practice setting.  All questions answered.   Last PCP: [place]- Dr. Judie Grieve March 2024 found PCP at [facility] but practice closed  Specialists: psych Celexa 20mg - sees about q3 months  Concerns: See below   After she had her last baby, she had several deficiencies that showed in blood work.  Reports deficiency in vitamin B12, vitamin D, and iron. She takes vitamin D supplements here and there.  Takes iron usually during her menstrual cycle.  Has been doing vitamin B12 injections in the past but would like to restart these   She has 4 children, her youngest is about a year old.  Her PCP at [facility] tested her hormone levels in her postpartum period   The following portions of the patient's history were reviewed and updated as appropriate: past medical history, past surgical history, family history, social history, allergies, medications, and problem list.      10/24/2023    8:22 AM 08/26/2023    2:06 PM 07/26/2022   10:43 AM 05/17/2022   10:54 AM  GAD 7 : Generalized Anxiety Score  Nervous, Anxious, on Edge 1 1 2 1   Control/stop worrying 1 1 2  0  Worry too much - different things 1 1 2 1   Trouble relaxing 1 1 2  0  Restless 0 0 2 0  Easily annoyed or irritable 2 1 2 1   Afraid - awful might happen 1 1 1  0  Total GAD 7 Score 7 6 13 3   Anxiety Difficulty Not difficult at all Not difficult at all Somewhat difficult Not difficult at all      10/24/2023    8:21 AM 08/26/2023    2:06 PM 09/10/2022    8:47 AM  PHQ9 SCORE ONLY  PHQ-9 Total Score 2 0 4    Patient Active Problem List   Diagnosis Date Noted   Iron deficiency anemia 01/10/2023   Mood swings 01/10/2023    Intraabdominal hemorrhage 11/12/2022   Encounter for elective induction of labor 11/10/2022   [redacted] weeks gestation of pregnancy 11/10/2022   Admission for sterilization 11/10/2022   Amenorrhea 04/02/2022   Persistent complex bereavement disorder 10/11/2021   Insomnia 07/30/2021   Attention and concentration deficit 07/30/2021   Anxiety and depression 05/03/2021   Iron deficiency 04/24/2021   B12 deficiency 04/23/2021   Pregnancy 10/11/2020   Depression during pregnancy 08/29/2020   Annual physical exam 10/29/2019   Obsessive-compulsive disorder 05/11/2019   Social anxiety disorder 05/11/2019   GAD (generalized anxiety disorder) 05/11/2019   FH: breast cancer in first degree relative 07/01/2013   Past Medical History:  Diagnosis Date   Allergy    Anxiety    Bone tumor (benign)    tumor vs cyst removed btwn age [age] y.o.    BRCA negative 03/2016   MyRisk neg except NBN VUS   Chicken pox    Complication of anesthesia    COVID-19    12/2019   Depression during pregnancy 08/29/2020   Family history of breast cancer 03/2016   IBIS=18.4%  She has been tested for at least BRCA and was negative (~2017). Tyrer-Cuzick (lifetime) was ~18%.    PONV (postoperative nausea and vomiting)    Pyelonephritis  S/P skin biopsy    neck normal    UTI (urinary tract infection)    Past Surgical History:  Procedure Laterality Date   BONE CYST EXCISION     right foot.   LAPAROSCOPY N/A 11/12/2022   Procedure: LAPAROSCOPY DIAGNOSTIC;  Surgeon: Conard Novak, MD;  Location: [facility];  Service: Gynecology;  Laterality: N/A;   TOTAL LAPAROSCOPIC HYSTERECTOMY WITH BILATERAL SALPINGO OOPHORECTOMY N/A 11/12/2022   Procedure: LAPAROSCOPIC BILATERAL SALPINGECTOMY, REAPAIR OF LEFT TUBE AVULSION, EVACUATION OF HEMOPERITONEUM;  Surgeon: Conard Novak, MD;  Location: [facility];  Service: Gynecology;  Laterality: N/A;   TUBAL LIGATION N/A 11/11/2022   Procedure: POST PARTUM TUBAL LIGATION;  Surgeon:  Conard Novak, MD;  Location: [facility];  Service: Gynecology;  Laterality: N/A;   Family History  Problem Relation Age of Onset   Breast cancer Mother 54       breast / died at 56 in 11/18/2009   Hypertension Mother    Depression Mother    Arthritis Father    Depression Father        committed suicide in 11-18-14   Hypertension Father    Hypertension Sister    Heart disease Sister        heart murmur   Birth defects Sister    Depression Sister    Arthritis Maternal Grandmother    Other Maternal Grandmother        elevated tau protein; had corticobasal degneration    Cancer Paternal Grandmother        lung   Emphysema Paternal Grandfather    Social History   Socioeconomic History   Marital status: Married    Spouse name: matthew   Number of children: 3   Years of education: Not on file   Highest education level: Master's degree (e.g., MA, MS, MEng, MEd, MSW, MBA)  Occupational History   Not on file  Tobacco Use   Smoking status: Never    Passive exposure: Never   Smokeless tobacco: Never  Vaping Use   Vaping status: Never Used  Substance and Sexual Activity   Alcohol use: Yes    Comment: social   Drug use: No   Sexual activity: Yes    Partners: Male    Birth control/protection: Surgical  Other Topics Concern   Not on file  Social History Narrative   Married 3 boys    Grew up in [place]    1 older sister 2 years older than her    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   Social Drivers of Corporate investment banker Strain: Not on file  Food Insecurity: No Food Insecurity (11/10/2022)   Hunger Vital Sign    Worried About Running Out of Food in the Last Year: Never true    Ran Out of Food in the Last Year: Never true  Transportation Needs: No Transportation Needs (11/10/2022)   PRAPARE - Administrator, Civil Service (Medical): No    Lack of Transportation (Non-Medical): No  Physical Activity: Not  on file  Stress: Not on file  Social Connections: Not on file  Intimate Partner Violence: Unknown (11/10/2022)   Humiliation, Afraid, Rape, and Kick questionnaire    Fear of Current or Ex-Partner: No    Emotionally Abused: Not on file    Physically Abused: Not on file    Sexually Abused: Not on file   Outpatient Medications Prior to Visit  Medication  Sig Dispense Refill   Ascorbic Acid (VITAMIN C) 100 MG tablet Take 100 mg by mouth daily.     cholecalciferol (VITAMIN D3) 25 MCG (1000 UNIT) tablet Take 1,000 Units by mouth daily.     citalopram (CELEXA) 20 MG tablet TAKE 1 TABLET BY MOUTH EVERY DAY WITH BREAKFAST 90 tablet 1   cyanocobalamin (VITAMIN B12) 1000 MCG/ML injection      ferrous sulfate 325 (65 FE) MG tablet Take 1 tablet (325 mg total) by mouth 2 (two) times daily with a meal.  3   ibuprofen (ADVIL) 600 MG tablet Take 1 tablet (600 mg total) by mouth every 6 (six) hours as needed for mild pain or cramping. 30 tablet 0   No facility-administered medications prior to visit.   Allergies  Allergen Reactions   Cefadroxil Nausea And Vomiting   Fire Ant Other (See Comments)    anaphylaxis anaphylaxis   Septra [Sulfamethoxazole-Trimethoprim] Nausea And Vomiting    ROS: A complete ROS was performed with pertinent positives/negatives noted in the HPI. The remainder of the ROS are negative.   Objective:   Today's Vitals   10/24/23 0819  BP: 127/86  Pulse: 71  Resp: 20  Temp: 98.1 F (36.7 C)  TempSrc: Oral  SpO2: 96%  Weight: 126 lb (57.2 kg)  Height: 5\' 4"  (1.626 m)  PainSc: 0-No pain    GENERAL: Well-appearing, in NAD. Well nourished.  SKIN: Pink, warm and dry. No rash, lesion, ulceration, or ecchymoses.  Head: Normocephalic. RESPIRATORY: Chest wall symmetrical. Respirations even and non-labored. Breath sounds clear to auscultation bilaterally.  CARDIAC: S1, S2 present, regular rate and rhythm without murmur or gallops. No bilateral lower extremity edema.  Peripheral pulses 2+ bilaterally.  MSK: Muscle tone and strength appropriate for age. Joints w/o tenderness, redness, or swelling.  EXTREMITIES: Without clubbing, cyanosis, or edema.  NEUROLOGIC: No motor or sensory deficits. Steady, even gait. C2-C12 intact.  PSYCH/MENTAL STATUS: Alert, oriented x 3. Cooperative, appropriate mood and affect.    Health Maintenance Due  Topic Date Due   INFLUENZA VACCINE  05/08/2023   COVID-19 Vaccine (1 - 2024-25 season) Never done   No results found for any visits on 10/24/23.     Assessment & Plan:   1. Encounter to establish care (Primary) Patient is a 38- year-old female who presents today to establish care with [facility] primary care at [facility]. Reviewed the past medical history, family history, social history, surgical history, medications and allergies today- updates made as indicated.   2. GAD (generalized anxiety disorder) GAD7 completed today with score of 7. PHQ9 completed with score of 2, denies SI/HI. Patient is currently taking Celexa 20mg  and has been on this medication and dose since 2017. She is followed by psychiatry, Dr. Elna Breslow. No changes to medication today.   - TSH Rfx on Abnormal to Free T4  3. Vitamin B12 deficiency Patient has been on vitamin B12 injections in the past and would like to restart these. Will check vitamin B12 level due to history of deficiency.  - Vitamin B12  4. Vitamin D deficiency Patient has a history of vitamin D deficiency. She reports she does not take this daily. Will check vitamin D levels today.  - VITAMIN D 25 Hydroxy (Vit-D Deficiency, Fractures)  5. Iron deficiency anemia secondary to inadequate dietary iron intake Patient reports history of IDA. Occasionally takes OTC ferrous sulfate, usually on her menstrual cycle. Will check CBC and iron studies today.  - CBC with Differential/Platelet - Iron,  TIBC and Ferritin Panel  6. Family history of breast cancer in mother Review of family  history- mother was diagnosed at age of 18. Patient reports having screening mammogram completed in 11/2019 with a follow-up ultrasound completed. Genetic testing was recommended and patient reports this was completed. Denies having breast cancer gene. Unable to find records in chart. Would be reasonable to continue with yearly mammograms based on family history- plan to order at CPE.  7. Wellness examination Will complete fasting labs today, in addition to iron, vitamin D, and vitamin B12 deficiencies. Plan for annual physical exam in near future.  - VITAMIN D 25 Hydroxy (Vit-D Deficiency, Fractures) - CBC with Differential/Platelet - Comprehensive metabolic panel - Hemoglobin A1c - Lipid panel - TSH Rfx on Abnormal to Free T4 - Vitamin B12 - Iron, TIBC and Ferritin Panel  Patient to reach out to office if new, worrisome, or unresolved symptoms arise or if no improvement in patient's condition. Patient verbalized understanding and is agreeable to treatment plan. All questions answered to patient's satisfaction.    Return in about 8 weeks (around 12/19/2023) for Physical.   Spent 45 minutes on this patient encounter, including preparation, chart review, face-to-face counseling with patient and coordination of care, and documentation of encounter.    Alyson Reedy, FNP

## 2023-10-24 NOTE — Patient Instructions (Signed)

## 2023-10-25 LAB — IRON,TIBC AND FERRITIN PANEL
Ferritin: 25 ng/mL (ref 15–150)
Iron Saturation: 15 % (ref 15–55)
Iron: 52 ug/dL (ref 27–159)
Total Iron Binding Capacity: 345 ug/dL (ref 250–450)
UIBC: 293 ug/dL (ref 131–425)

## 2023-10-25 LAB — COMPREHENSIVE METABOLIC PANEL
ALT: 13 [IU]/L (ref 0–32)
AST: 15 [IU]/L (ref 0–40)
Albumin: 4.6 g/dL (ref 3.9–4.9)
Alkaline Phosphatase: 46 [IU]/L (ref 44–121)
BUN/Creatinine Ratio: 23 (ref 9–23)
BUN: 15 mg/dL (ref 6–20)
Bilirubin Total: 0.4 mg/dL (ref 0.0–1.2)
CO2: 23 mmol/L (ref 20–29)
Calcium: 9.2 mg/dL (ref 8.7–10.2)
Chloride: 103 mmol/L (ref 96–106)
Creatinine, Ser: 0.65 mg/dL (ref 0.57–1.00)
Globulin, Total: 2.2 g/dL (ref 1.5–4.5)
Glucose: 88 mg/dL (ref 70–99)
Potassium: 4.2 mmol/L (ref 3.5–5.2)
Sodium: 139 mmol/L (ref 134–144)
Total Protein: 6.8 g/dL (ref 6.0–8.5)
eGFR: 118 mL/min/{1.73_m2} (ref >59)

## 2023-10-25 LAB — LIPID PANEL
Chol/HDL Ratio: 2.9 {ratio} (ref 0.0–4.4)
Cholesterol, Total: 120 mg/dL (ref 100–199)
HDL: 42 mg/dL (ref >39)
LDL Chol Calc (NIH): 67 mg/dL (ref 0–99)
Triglycerides: 44 mg/dL (ref 0–149)
VLDL Cholesterol Cal: 11 mg/dL (ref 5–40)

## 2023-10-25 LAB — CBC WITH DIFFERENTIAL/PLATELET
Basophils Absolute: 0.1 10*3/uL (ref 0.0–0.2)
Basos: 1 %
EOS (ABSOLUTE): 0.1 10*3/uL (ref 0.0–0.4)
Eos: 3 %
Hematocrit: 40.9 % (ref 34.0–46.6)
Hemoglobin: 13.1 g/dL (ref 11.1–15.9)
Immature Grans (Abs): 0 10*3/uL (ref 0.0–0.1)
Immature Granulocytes: 0 %
Lymphocytes Absolute: 1.4 10*3/uL (ref 0.7–3.1)
Lymphs: 28 %
MCH: 30.6 pg (ref 26.6–33.0)
MCHC: 32 g/dL (ref 31.5–35.7)
MCV: 96 fL (ref 79–97)
Monocytes Absolute: 0.4 10*3/uL (ref 0.1–0.9)
Monocytes: 8 %
Neutrophils Absolute: 2.9 10*3/uL (ref 1.4–7.0)
Neutrophils: 60 %
Platelets: 314 10*3/uL (ref 150–450)
RBC: 4.28 x10E6/uL (ref 3.77–5.28)
RDW: 12.5 % (ref 11.7–15.4)
WBC: 4.9 10*3/uL (ref 3.4–10.8)

## 2023-10-25 LAB — TSH RFX ON ABNORMAL TO FREE T4: TSH: 1.78 u[IU]/mL (ref 0.450–4.500)

## 2023-10-25 LAB — HEMOGLOBIN A1C
Est. average glucose Bld gHb Est-mCnc: 97 mg/dL
Hgb A1c MFr Bld: 5 % (ref 4.8–5.6)

## 2023-10-25 LAB — VITAMIN D 25 HYDROXY (VIT D DEFICIENCY, FRACTURES): Vit D, 25-Hydroxy: 29.3 ng/mL — ABNORMAL LOW (ref 30.0–100.0)

## 2023-10-25 LAB — VITAMIN B12: Vitamin B-12: 469 pg/mL (ref 232–1245)

## 2023-10-27 ENCOUNTER — Encounter: Payer: Self-pay | Admitting: Family Medicine

## 2023-11-28 ENCOUNTER — Telehealth: Payer: Self-pay

## 2023-11-28 DIAGNOSIS — F411 Generalized anxiety disorder: Secondary | ICD-10-CM

## 2023-11-28 DIAGNOSIS — F4010 Social phobia, unspecified: Secondary | ICD-10-CM

## 2023-11-28 MED ORDER — CITALOPRAM HYDROBROMIDE 20 MG PO TABS: 20.0000 mg | ORAL_TABLET | Freq: Every day | ORAL | 1 refills | Status: DC

## 2023-11-28 NOTE — Telephone Encounter (Signed)
received fax requesting a refill on the citalopram. pt was last seen on 11-19 next appt 5-20

## 2023-11-28 NOTE — Telephone Encounter (Signed)
I have sent Celexa to [facility] pharmacy.

## 2023-12-01 NOTE — Telephone Encounter (Signed)
 pt was called she stated that she had no issues that she has not picked up medication yet but she plans on getting after work.

## 2023-12-26 ENCOUNTER — Encounter: Payer: Self-pay | Admitting: Family Medicine

## 2023-12-26 ENCOUNTER — Telehealth (INDEPENDENT_AMBULATORY_CARE_PROVIDER_SITE_OTHER): Admitting: Family Medicine

## 2023-12-26 DIAGNOSIS — B9689 Other specified bacterial agents as the cause of diseases classified elsewhere: Secondary | ICD-10-CM | POA: Diagnosis not present

## 2023-12-26 DIAGNOSIS — J019 Acute sinusitis, unspecified: Secondary | ICD-10-CM

## 2023-12-26 MED ORDER — DOXYCYCLINE HYCLATE 100 MG PO TABS: 100.0000 mg | ORAL_TABLET | Freq: Two times a day (BID) | ORAL | 0 refills | Status: AC

## 2023-12-26 NOTE — Progress Notes (Signed)
 Virtual Visit via Video Note  I connected with Charlcie Cradle on 12/26/2023 at 09:10AM by a video enabled telemedicine application and verified that I am speaking with the correct person using two identifiers.  Patient Location: Home Provider Location: Office/Clinic  I discussed the limitations, risks, security, and privacy concerns of performing an evaluation and management service by video and the availability of in person appointments. I also discussed with the patient that there may be a patient responsible charge related to this service. The patient expressed understanding and agreed to proceed.  Subjective: PCP: Alyson Reedy, FNP  Chief Complaint  Patient presents with   Sinusitis    Sudafed and Cetirizine OTC. Min relief. Sinus pressure headache. No fevers, body aches, chills    Sore Throat   nasal drip   mucus    yellow   Ear Fullness    Bilateral pressure. Started 12/20/23   Patient presents virtually for concerns regarding possible sinus infection. She has been using OTC cetirizine and Sudafed. She reports sore throat, postnasal drip, ear pressure, nasal pain/pressure. Denies fever/chills, myalgias, headache, vision changes, N/V/D.   ROS: Per HPI  Current Outpatient Medications:    Ascorbic Acid (VITAMIN C) 100 MG tablet, Take 100 mg by mouth daily., Disp: , Rfl:    cholecalciferol (VITAMIN D3) 25 MCG (1000 UNIT) tablet, Take 1,000 Units by mouth daily., Disp: , Rfl:    citalopram (CELEXA) 20 MG tablet, Take 1 tablet (20 mg total) by mouth daily., Disp: 90 tablet, Rfl: 1   cyanocobalamin (VITAMIN B12) 1000 MCG/ML injection, , Disp: , Rfl:    doxycycline (VIBRA-TABS) 100 MG tablet, Take 1 tablet (100 mg total) by mouth 2 (two) times daily for 7 days., Disp: 14 tablet, Rfl: 0   ferrous sulfate 325 (65 FE) MG tablet, Take 1 tablet (325 mg total) by mouth 2 (two) times daily with a meal., Disp: , Rfl: 3   ibuprofen (ADVIL) 600 MG tablet, Take 1 tablet (600 mg total) by  mouth every 6 (six) hours as needed for mild pain or cramping., Disp: 30 tablet, Rfl: 0  Observations/Objective: There were no vitals filed for this visit.  General: Alert and oriented x 4. Speaking in clear and full sentences, no audible heavy breathing, no acute distress.  Sounds alert and appropriately interactive.  Appears well.  Face symmetric.  Extraocular movements intact.  Pupils equal and round.  No nasal flaring or accessory muscle use visualized.  Assessment and Plan: 1. Acute bacterial rhinosinusitis (Primary) Discussed limitations with virtual appointment and patient verbalized an understanding. Based on presentation, symptoms and durations, most likely acute bacterial rhinosinusitis. Discussed symptomatic treatment. Reasonable to prescribe patient with antibiotic at this time. Reviewed medication allergies- rx sent to pharmacy on file. Discussed to return to office if symptoms worsen or do not improve.  - doxycycline (VIBRA-TABS) 100 MG tablet; Take 1 tablet (100 mg total) by mouth 2 (two) times daily for 7 days.  Dispense: 14 tablet; Refill: 0   Follow Up Instructions: Return if symptoms worsen or fail to improve.   I discussed the assessment and treatment plan with the patient. The patient was provided an opportunity to ask questions, and all were answered. The patient agreed with the plan and demonstrated an understanding of the instructions.   The patient was advised to call back or seek an in-person evaluation if the symptoms worsen or if the condition fails to improve as anticipated.  The above assessment and management plan was discussed with the  patient. The patient verbalized understanding of and has agreed to the management plan.   Alyson Reedy, FNP

## 2024-01-30 ENCOUNTER — Encounter: Payer: Self-pay | Admitting: Family Medicine

## 2024-01-30 ENCOUNTER — Ambulatory Visit (INDEPENDENT_AMBULATORY_CARE_PROVIDER_SITE_OTHER): Admitting: Family Medicine

## 2024-01-30 VITALS — BP 124/81 | HR 73 | Temp 98.0°F | Resp 16 | Ht 64.0 in | Wt 125.4 lb

## 2024-01-30 DIAGNOSIS — R350 Frequency of micturition: Secondary | ICD-10-CM | POA: Diagnosis not present

## 2024-01-30 DIAGNOSIS — N3000 Acute cystitis without hematuria: Secondary | ICD-10-CM

## 2024-01-30 LAB — POCT URINALYSIS DIP (CLINITEK)
Bilirubin, UA: NEGATIVE
Glucose, UA: NEGATIVE mg/dL
Ketones, POC UA: NEGATIVE mg/dL
Nitrite, UA: NEGATIVE
Spec Grav, UA: >=1.03 — AB (ref 1.010–1.025)
Urobilinogen, UA: 0.2 U/dL
pH, UA: 6 (ref 5.0–8.0)

## 2024-01-30 MED ORDER — CIPROFLOXACIN HCL 500 MG PO TABS: 500.0000 mg | ORAL_TABLET | Freq: Two times a day (BID) | ORAL | 0 refills | Status: DC

## 2024-01-30 NOTE — Patient Instructions (Signed)

## 2024-01-30 NOTE — Addendum Note (Signed)
 Addended by: Catarina Cline on: 01/30/2024 09:25 AM   Modules accepted: Orders

## 2024-01-30 NOTE — Progress Notes (Signed)
 Acute Care Office Visit  Subjective:   Chelsea Duncan 1985/12/30 01/30/2024  Chief Complaint  Patient presents with   Urinary Tract Infection    Pt. Stated of having constant frequency of urine. Pt. Stated it started on 01/22/2024 with no fowl smell or discharge. Pt. Denies pain.    Urinary Frequency: Onset: 7 days ago  Worsening: yes    Symptoms Urgency: no  Frequency: yes, small amount comes out  Hesitancy: no  Dysuria: no  Hematuria: no  Flank Pain: no  Fever: no           Nausea/Vomiting: no  Pregnant: no STD exposure/history: no Discharge: no Irritants: no Rash: no  In childhood, did have a lot of bladder infections.  Allergies to sulfa medications.   The following portions of the patient's history were reviewed and updated as appropriate: past medical history, past surgical history, family history, social history, allergies, medications, and problem list.   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    BRCA negative 03/2016   MyRisk neg except NBN VUS    Chicken pox    Complication of anesthesia    COVID-19    12/2019   Depression during pregnancy 08/29/2020   Family history of breast cancer 03/2016   IBIS=18.4%  She has been tested for at least BRCA and was negative 2016/02/10). Tyrer-Cuzick (lifetime) was ~18%.    PONV (postoperative nausea and vomiting)    Pyelonephritis    S/P skin biopsy    neck normal    UTI (urinary tract infection)    Past Surgical History:  Procedure Laterality Date   BONE CYST EXCISION     right foot.   LAPAROSCOPY N/A 11/12/2022   Procedure: LAPAROSCOPY DIAGNOSTIC;  Surgeon: Kris Pester, MD;  Location: [facility];  Service: Gynecology;  Laterality: N/A;   TOTAL LAPAROSCOPIC HYSTERECTOMY WITH BILATERAL SALPINGO OOPHORECTOMY N/A 11/12/2022   Procedure: LAPAROSCOPIC BILATERAL SALPINGECTOMY, REAPAIR OF LEFT TUBE AVULSION, EVACUATION OF HEMOPERITONEUM;  Surgeon: Kris Pester, MD;  Location: [facility];  Service: Gynecology;  Laterality: N/A;   TUBAL LIGATION N/A 11/11/2022   Procedure: POST PARTUM TUBAL LIGATION;  Surgeon: Kris Pester, MD;  Location: [facility];  Service: Gynecology;  Laterality: N/A;   Family History  Problem Relation Age of Onset   Breast cancer Mother 69       breast / died at 22 in 2010/02/09   Hypertension Mother    Depression Mother    Arthritis Father    Depression Father        committed suicide in Feb 10, 2015  Hypertension Father    Hypertension Sister    Heart disease Sister        heart murmur   Birth defects Sister    Depression Sister    Arthritis Maternal Grandmother    Other Maternal Grandmother        elevated tau protein; had corticobasal degneration    Cancer Paternal Grandmother        lung   Emphysema Paternal Grandfather    Outpatient Medications Prior to Visit  Medication Sig Dispense Refill   Ascorbic Acid (VITAMIN C) 100 MG tablet Take 100 mg by mouth daily.     cholecalciferol (VITAMIN D3) 25 MCG (1000 UNIT) tablet Take 1,000 Units by mouth daily.     citalopram   (CELEXA ) 20 MG tablet Take 1 tablet (20 mg total) by mouth daily. 90 tablet 1   cyanocobalamin  (VITAMIN B12) 1000 MCG/ML injection      ferrous sulfate  325 (65 FE) MG tablet Take 1 tablet (325 mg total) by mouth 2 (two) times daily with a meal.  3   ibuprofen  (ADVIL ) 600 MG tablet Take 1 tablet (600 mg total) by mouth every 6 (six) hours as needed for mild pain or cramping. 30 tablet 0   No facility-administered medications prior to visit.   Allergies  Allergen Reactions   Cefadroxil Nausea And Vomiting   Fire Ant Other (See Comments)    anaphylaxis anaphylaxis   Septra [Sulfamethoxazole-Trimethoprim] Nausea And Vomiting     ROS: A complete ROS was performed with pertinent positives/negatives noted in the HPI. The remainder of the ROS are negative.    Objective:   Today's Vitals   01/30/24 0830  BP: 124/81  Pulse: 73  Resp: 16  Temp: 98 F (36.7 C)  TempSrc: Oral  SpO2: 98%  Weight: 125 lb 6.4 oz (56.9 kg)  Height: 5\' 4"  (1.626 m)  PainSc: 0-No pain    GENERAL: Well-appearing, in NAD. Well nourished.  SKIN: Pink, warm and dry. No rash, lesion, ulceration, or ecchymoses.  Head: Normocephalic. NECK: Trachea midline. Full ROM w/o pain or tenderness. No lymphadenopathy.  EARS: Tympanic membranes are intact, translucent without bulging and without drainage. Appropriate landmarks visualized.  EYES: Conjunctiva clear without exudates. EOMI, PERRL, no drainage present.  NOSE: Septum midline w/o deformity. Nares patent, mucosa pink and non-inflamed w/o drainage. No sinus tenderness.  THROAT: Uvula midline. Oropharynx clear. Tonsils non-inflamed without exudate. Mucous membranes pink and moist.  RESPIRATORY: Chest wall symmetrical. Respirations even and non-labored. Breath sounds clear to auscultation bilaterally.  CARDIAC: S1, S2 present, regular rate and rhythm without murmur or gallops. Peripheral pulses 2+ bilaterally.  MSK: Muscle tone and strength appropriate for age. Joints  w/o tenderness, redness, or swelling.  EXTREMITIES: Without clubbing, cyanosis, or edema.  NEUROLOGIC: No motor or sensory deficits. Steady, even gait. C2-C12 intact.  PSYCH/MENTAL STATUS: Alert, oriented x 3. Cooperative, appropriate mood and affect.     Assessment & Plan:   1. Urinary frequency (Primary) Patient presents today with 7 days of urinary frequency. Denies urinary urgency, painful urination, flank pain, fever/chills, abdominal pain. Appears well, in no apparent distress.  Vital signs are normal. The abdomen is soft without tenderness, guarding, mass, rebound or organomegaly. No CVA tenderness or inguinal adenopathy noted. Urine dipstick shows positive for trace RBC's, trace protein, and positive for leukocytes. No concerning symptoms at this time. Advised patient to adequately hydrate. Reviewed allergies and recent antibiotic use. Ordered ciprofloxacin  BID x7 days. Will send for culture, based on recent history  of pyelonephritis, to ensure adequate treatment of bacterial infection. Advised patient if she develops fever, body aches, chills, upper back pain, fatigue/malaise, nausea/vomiting, and any other acute symptoms over the weekend to seek emergency care.     2. Acute cystitis without hematuria See #1 - ciprofloxacin  (CIPRO ) 500 MG tablet; Take 1 tablet (500 mg total) by mouth 2 (two) times daily. With food  Dispense: 14 tablet; Refill: 0   Return if symptoms worsen or fail to improve.    Patient to reach out to office if new, worrisome, or unresolved symptoms arise or if no improvement in patient's condition. Patient verbalized understanding and is agreeable to treatment plan. All questions answered to patient's satisfaction.    Wilhelmena Hanson, FNP

## 2024-02-02 NOTE — Telephone Encounter (Signed)
 Chelsea Duncan, please see mychart sent by pt about meds and advise.

## 2024-02-03 LAB — URINE CULTURE

## 2024-02-03 NOTE — Telephone Encounter (Signed)
 Pt called in to speak to someone regarding this encounter, she also wanted to know how long it would take to hear back as she's had this infection for5 days now and hasn't started her antibiotics. I contacted [facility] to f/u and see if someone ould assist. I was advised to just send a crm, with no questions answered nor further insight on the matter. I apologized to the pt and advised her someone would be in touch once Chelsea Duncan has reviewed her request.

## 2024-02-24 ENCOUNTER — Telehealth (INDEPENDENT_AMBULATORY_CARE_PROVIDER_SITE_OTHER): Payer: Self-pay | Admitting: Psychiatry

## 2024-02-24 ENCOUNTER — Encounter: Payer: Self-pay | Admitting: Psychiatry

## 2024-02-24 DIAGNOSIS — F411 Generalized anxiety disorder: Secondary | ICD-10-CM

## 2024-02-24 DIAGNOSIS — F4010 Social phobia, unspecified: Secondary | ICD-10-CM

## 2024-02-24 DIAGNOSIS — G4709 Other insomnia: Secondary | ICD-10-CM

## 2024-02-24 NOTE — Progress Notes (Signed)
 Virtual Visit via Video Note  I connected with French Jester on 02/24/24 at  9:00 AM EDT by a video enabled telemedicine application and verified that I am speaking with the correct person using two identifiers.  Location Provider Location : [facility] Patient Location : Work  Participants: Patient , Provider    I discussed the limitations of evaluation and management by telemedicine and the availability of in person appointments. The patient expressed understanding and agreed to proceed.   I discussed the assessment and treatment plan with the patient. The patient was provided an opportunity to ask questions and all were answered. The patient agreed with the plan and demonstrated an understanding of the instructions.   The patient was advised to call back or seek an in-person evaluation if the symptoms worsen or if the condition fails to improve as anticipated.   BH MD OP Progress Note  02/24/2024 9:30 AM Ellar Hakala  MRN:  [id number]  Chief Complaint:  Chief Complaint  Patient presents with   Follow-up   Anxiety   Medication Refill   Insomnia   Discussed the use of AI scribe software for clinical note transcription with the patient, who gave verbal consent to proceed.  History of Present Illness Syndi Pua is a 38 year old Caucasian female, married, currently employed, lives in [place] with her spouse, has a history of GAD, social anxiety, bereavement, insomnia was evaluated by telemedicine today.  She has been doing well since her last visit in November. She transitioned to a new role at work last June, which has been a positive change, and she appreciates the support from her supervisor. Her current dose of Celexa , 20 mg daily, is effective in managing her anxiety, particularly in reducing irritability and helping her maintain patience with her family. She noticed increased irritability when she forgot to take her medication during a recent beach trip.  Her sleep is generally  good, although she occasionally wakes up due to her children. Her youngest child, who is 70 months old, sleeps in his crib and sometimes wakes up at night. She feels that her sleep has improved since her previous career and is careful not to use her phone at night to avoid disrupting her sleep.  She discusses her experience with grief, particularly around the anniversary of her father's death in 12/23/2023. She copes by focusing on her blessings and staying busy, although she acknowledges that certain events, like Mother's Day programs, can be bittersweet. She feels that her approach to grief is working for her, even if it involves some avoidance.  She mentions a recent incident where she was prescribed an antibiotic for a UTI but chose not to take it due to potential interactions with Celexa  that could affect her heart.   In addition to Celexa , she takes Claritin 10 mg as needed for seasonal allergies, depending on the pollen levels. She is not currently taking any other medications and is not breastfeeding.  She denies any suicidality, homicidality or perceptual disturbances.   Visit Diagnosis:    ICD-10-CM   1. GAD (generalized anxiety disorder)  F41.1     2. Social anxiety disorder  F40.10     3. Other insomnia  G47.09    Stable      Past Psychiatric History: I have reviewed past psychiatric history from progress note on 10/11/2021.  Past trials of medications like Zoloft , Celexa , Xanax .  Reviewed neuropsychological testing by Dr. Ettie Hermanns not meet criteria for ADHD-dated 03/29/2019 - 04/20/2019.  Past Medical  History:  Past Medical History:  Diagnosis Date   Allergy    Anxiety    Bone tumor (benign)    tumor vs cyst removed btwn age [age] y.o.    BRCA negative 13-Mar-2016   MyRisk neg except NBN VUS   Chicken pox    Complication of anesthesia    COVID-19    12/2019   Depression during pregnancy 08/29/2020   Family history of breast cancer 2016-03-13   IBIS=18.4%  She has been  tested for at least BRCA and was negative (~2017). Tyrer-Cuzick (lifetime) was ~18%.    PONV (postoperative nausea and vomiting)    Pyelonephritis    S/P skin biopsy    neck normal    UTI (urinary tract infection)     Past Surgical History:  Procedure Laterality Date   BONE CYST EXCISION     right foot.   LAPAROSCOPY N/A 11/12/2022   Procedure: LAPAROSCOPY DIAGNOSTIC;  Surgeon: Kris Pester, MD;  Location: [facility];  Service: Gynecology;  Laterality: N/A;   TOTAL LAPAROSCOPIC HYSTERECTOMY WITH BILATERAL SALPINGO OOPHORECTOMY N/A 11/12/2022   Procedure: LAPAROSCOPIC BILATERAL SALPINGECTOMY, REAPAIR OF LEFT TUBE AVULSION, EVACUATION OF HEMOPERITONEUM;  Surgeon: Kris Pester, MD;  Location: [facility];  Service: Gynecology;  Laterality: N/A;   TUBAL LIGATION N/A 11/11/2022   Procedure: POST PARTUM TUBAL LIGATION;  Surgeon: Kris Pester, MD;  Location: [facility];  Service: Gynecology;  Laterality: N/A;    Family Psychiatric History: I have reviewed family psychiatric history from progress note on 10/11/2021.  Family History:  Family History  Problem Relation Age of Onset   Breast cancer Mother 36       breast / died at 50 in 03-13-10   Hypertension Mother    Depression Mother    Arthritis Father    Depression Father        committed suicide in 14-Mar-2015   Hypertension Father    Hypertension Sister    Heart disease Sister        heart murmur   Birth defects Sister    Depression Sister    Arthritis Maternal Grandmother    Other Maternal Grandmother        elevated tau protein; had corticobasal degneration    Cancer Paternal Grandmother        lung   Emphysema Paternal Grandfather     Social History: I have reviewed social history from progress note on 10/11/2021. Social History   Socioeconomic History   Marital status: Married    Spouse name: [email or web address]   Number of children: 3   Years of education: Not on file   Highest education level: Master's degree (e.g., MA, MS, MEng,  MEd, MSW, MBA)  Occupational History   Not on file  Tobacco Use   Smoking status: Never    Passive exposure: Never   Smokeless tobacco: Never  Vaping Use   Vaping status: Never Used  Substance and Sexual Activity   Alcohol use: Yes    Comment: social   Drug use: No   Sexual activity: Yes    Partners: Male    Birth control/protection: Surgical  Other Topics Concern   Not on file  Social History Narrative   Married 3 boys    Grew up in [place]    1 older sister 2 years older than her    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   Social Drivers of Corporate investment banker  Strain: Not on file  Food Insecurity: No Food Insecurity (11/10/2022)   Hunger Vital Sign    Worried About Running Out of Food in the Last Year: Never true    Ran Out of Food in the Last Year: Never true  Transportation Needs: No Transportation Needs (11/10/2022)   PRAPARE - Administrator, Civil Service (Medical): No    Lack of Transportation (Non-Medical): No  Physical Activity: Not on file  Stress: Not on file  Social Connections: Not on file    Allergies:  Allergies  Allergen Reactions   Cefadroxil Nausea And Vomiting   Fire Ant Other (See Comments)    anaphylaxis anaphylaxis   Septra [Sulfamethoxazole-Trimethoprim] Nausea And Vomiting    Metabolic Disorder Labs: Lab Results  Component Value Date   HGBA1C 5.0 10/24/2023   No results found for: "PROLACTIN" Lab Results  Component Value Date   CHOL 120 10/24/2023   TRIG 44 10/24/2023   HDL 42 10/24/2023   CHOLHDL 2.9 10/24/2023   VLDL 16.1 04/20/2021   LDLCALC 67 10/24/2023   LDLCALC 67 04/20/2021   Lab Results  Component Value Date   TSH 1.780 10/24/2023   TSH 1.54 08/29/2021    Therapeutic Level Labs: No results found for: "LITHIUM" No results found for: "VALPROATE" No results found for: "CBMZ"  Current Medications: Current Outpatient Medications  Medication  Sig Dispense Refill   loratadine (CLARITIN) 10 MG tablet Take 10 mg by mouth daily.     Ascorbic Acid (VITAMIN C) 100 MG tablet Take 100 mg by mouth daily.     cholecalciferol (VITAMIN D3) 25 MCG (1000 UNIT) tablet Take 1,000 Units by mouth daily.     citalopram  (CELEXA ) 20 MG tablet Take 1 tablet (20 mg total) by mouth daily. 90 tablet 1   cyanocobalamin  (VITAMIN B12) 1000 MCG/ML injection      ferrous sulfate  325 (65 FE) MG tablet Take 1 tablet (325 mg total) by mouth 2 (two) times daily with a meal.  3   ibuprofen  (ADVIL ) 600 MG tablet Take 1 tablet (600 mg total) by mouth every 6 (six) hours as needed for mild pain or cramping. 30 tablet 0   No current facility-administered medications for this visit.     Musculoskeletal: Strength & Muscle Tone: UTA Gait & Station: Seated Patient leans: N/A  Psychiatric Specialty Exam: Review of Systems  Psychiatric/Behavioral: Negative.      Last menstrual period 01/10/2024, not currently breastfeeding.There is no height or weight on file to calculate BMI.  General Appearance: Casual  Eye Contact:  Fair  Speech:  Clear and Coherent  Volume:  Normal  Mood:  Euthymic  Affect:  Congruent  Thought Process:  Goal Directed and Descriptions of Associations: Intact  Orientation:  Full (Time, Place, and Person)  Thought Content: Logical   Suicidal Thoughts:  No  Homicidal Thoughts:  No  Memory:  Immediate;   Fair Recent;   Fair Remote;   Fair  Judgement:  Fair  Insight:  Fair  Psychomotor Activity:  Normal  Concentration:  Concentration: Fair and Attention Span: Fair  Recall:  [employer] of Knowledge: Fair  Language: Fair  Akathisia:  No  Handed:  Right  AIMS (if indicated): not done  Assets:  Communication Skills Desire for Improvement Housing Intimacy Leisure Time Talents/Skills Transportation Vocational/Educational  ADL's:  Intact  Cognition: WNL  Sleep:  Fair   Screenings: AIMS    Flowsheet Row Video Visit from 05/17/2022  in [facility] Video Visit from 04/01/2022 in [facility]  AIMS Total Score 0 0      AUDIT    Flowsheet Row Office Visit from 06/11/2017 in [facility]  Alcohol Use Disorder Identification Test Final Score (AUDIT) 0      GAD-7    Flowsheet Row Office Visit from 01/30/2024 in [facility] Visit from 10/24/2023 in [facility] Visit from 08/26/2023 in [facility] Video Visit from 07/26/2022 in [facility] Video Visit from 05/17/2022 in [facility]  Total GAD-7 Score 5 7 6 13 3       PHQ2-9    Flowsheet Row Office Visit from 01/30/2024 in [facility] Visit from 10/24/2023 in [facility] Visit from 08/26/2023 in [facility] Video Visit from 09/10/2022 in [facility] Counselor from 08/27/2022 in [facility] Total Score 0 0 0 0 0  PHQ-9 Total Score 1 2 -- 4 --      Flowsheet Row Video Visit from 02/24/2024 in [facility] Office Visit from 08/26/2023 in [facility] Video Visit from 05/06/2023 in [facility]  C-SSRS RISK CATEGORY No Risk No Risk No Risk        Assessment and Plan: Lilou Kneip is a 38 year old Caucasian female, with anxiety, was evaluated by telemedicine today.  Discussed assessment and plan as noted below.  Generalized anxiety disorder-stable Currently reports anxiety symptoms overall manageable on the current medication regimen. - Continue Celexa  20 mg daily.  Would like to stay on this  dose for now.  Social anxiety disorder-stable Currently reports social anxiety as improved and denies any recent and denies any recent concerns. - Continue Celexa  20 mg daily  Insomnia-stable Currently toddler sleeps better through the night and that has helped her with her sleep as well.  She continues to work on sleep hygiene. - Encouraged to continue sleep hygiene techniques  Follow-up in clinic in 5 months or sooner if needed.   Consent: Patient/Guardian gives verbal consent for treatment and assignment of benefits for services provided during this visit. Patient/Guardian expressed understanding and agreed to proceed.   This note was generated in part or whole with voice recognition software. Voice recognition is usually quite accurate but there are transcription errors that can and very often do occur. I apologize for any typographical errors that were not detected and corrected.    Paxtyn Boyar, MD 02/24/2024, 9:30 AM

## 2024-03-29 ENCOUNTER — Encounter: Payer: Self-pay | Admitting: Family Medicine

## 2024-04-02 ENCOUNTER — Other Ambulatory Visit (HOSPITAL_COMMUNITY)
Admission: RE | Admit: 2024-04-02 | Discharge: 2024-04-02 | Disposition: A | Discharge status: Home / Self Care | Source: Ambulatory Visit | Attending: Family Medicine | Admitting: Family Medicine

## 2024-04-02 ENCOUNTER — Ambulatory Visit (INDEPENDENT_AMBULATORY_CARE_PROVIDER_SITE_OTHER): Payer: Self-pay | Admitting: Family Medicine

## 2024-04-02 VITALS — BP 111/74 | HR 72 | Temp 98.3°F | Ht 64.0 in | Wt 125.0 lb

## 2024-04-02 DIAGNOSIS — Z0000 Encounter for general adult medical examination without abnormal findings: Secondary | ICD-10-CM | POA: Insufficient documentation

## 2024-04-02 DIAGNOSIS — Z124 Encounter for screening for malignant neoplasm of cervix: Secondary | ICD-10-CM | POA: Diagnosis present

## 2024-04-02 DIAGNOSIS — E559 Vitamin D deficiency, unspecified: Secondary | ICD-10-CM

## 2024-04-02 DIAGNOSIS — E538 Deficiency of other specified B group vitamins: Secondary | ICD-10-CM | POA: Diagnosis not present

## 2024-04-02 MED ORDER — CYANOCOBALAMIN 1000 MCG/ML IJ SOLN: 1000.0000 ug | Freq: Once | INTRAMUSCULAR | Status: AC

## 2024-04-02 NOTE — Patient Instructions (Signed)
 Health Maintenance Recommendations Screening Testing Mammogram Every 1 -2 years based on history and risk factors Starting at age 36 Pap Smear Ages 21-39 every 3 years Ages 23-65 every 5 years with HPV testing More frequent testing may be required based on results and history Colon Cancer Screening Every 1-10 years based on test performed, risk factors, and history Starting at age 102 Bone Density Screening Every 2-10 years based on history Starting at age 69 for women Recommendations for men differ based on medication usage, history, and risk factors AAA Screening One time ultrasound Men 30-10 years old who have every smoked Lung Cancer Screening Low Dose Lung CT every 12 months Age 20-80 years with a 30 pack-year smoking history who still smoke or who have quit within the last 15 years   Screening Labs Routine  Labs: Complete Blood Count (CBC), Complete Metabolic Panel (CMP), Cholesterol (Lipid Panel) Every 6-12 months based on history and medications May be recommended more frequently based on current conditions or previous results Hemoglobin A1c Lab Every 3-12 months based on history and previous results Starting at age 24 or earlier with diagnosis of diabetes, high cholesterol, BMI >26, and/or risk factors Frequent monitoring for patients with diabetes to ensure blood sugar control Thyroid Panel (TSH w/ T3 & T4) Every 6 months based on history, symptoms, and risk factors May be repeated more often if on medication HIV One time testing for all patients 23 and older May be repeated more frequently for patients with increased risk factors or exposure Hepatitis C One time testing for all patients 47 and older May be repeated more frequently for patients with increased risk factors or exposure Gonorrhea, Chlamydia Every 12 months for all sexually active persons 13-24 years Additional monitoring may be recommended for those who are considered high risk or who have  symptoms PSA Men 72-66 years old with risk factors Additional screening may be recommended from age 2-69 based on risk factors, symptoms, and history   Vaccine Recommendations Tetanus Booster All adults every 10 years Flu Vaccine All patients 6 months and older every year COVID Vaccine All patients 12 years and older Initial dosing with booster May recommend additional booster based on age and health history HPV Vaccine 2 doses all patients age 56-26 Dosing may be considered for patients over 26 Shingles Vaccine (Shingrix) 2 doses all adults 55 years and older Pneumonia (Pneumovax 23) All adults 65 years and older May recommend earlier dosing based on health history Pneumonia (Prevnar 16) All adults 65 years and older Dosed 1 year after Pneumovax 23   Additional Screening, Testing, and Vaccinations may be recommended on an individualized basis based on family history, health history, risk factors, and/or exposure.  __________________________________________________________   Diet Recommendations for All Patients   I recommend that all patients maintain a diet low in saturated fats, carbohydrates, and cholesterol. While this can be challenging at first, it is not impossible and small changes can make big differences.  Things to try: Decreasing the amount of soda, sweet tea, and/or juice to one or less per day and replace with water While water is always the first choice, if you do not like water you may consider adding a water additive without sugar to improve the taste other sugar free drinks Replace potatoes with a brightly colored vegetable at dinner Use healthy oils, such as canola oil or olive oil, instead of butter or hard margarine Limit your bread intake to two pieces or less a day Replace regular pasta with  low carb pasta options Bake, broil, or grill foods instead of frying Monitor portion sizes  Eat smaller, more frequent meals throughout the day instead of large  meals   An important thing to remember is, if you love foods that are not great for your health, you don't have to give them up completely. Instead, allow these foods to be a reward when you have done well. Allowing yourself to still have special treats every once in a while is a nice way to tell yourself thank you for working hard to keep yourself healthy.    Also remember that every day is a new day. If you have a bad day and "fall off the wagon", you can still climb right back up and keep moving along on your journey!   We have resources available to help you!  Some websites that may be helpful include: www.http://www.wall-moore.info/        Www.VeryWellFit.com _____________________________________________________________   Activity Recommendations for All Patients   I recommend that all adults get at least 20 minutes of moderate physical activity that elevates your heart rate at least 5 days out of the week.  Some examples include: Walking or jogging at a pace that allows you to carry on a conversation Cycling (stationary bike or outdoors) Water aerobics Yoga Weight lifting Dancing If physical limitations prevent you from putting stress on your joints, exercise in a pool or seated in a chair are excellent options.   Do determine your MAXIMUM heart rate for activity: YOUR AGE - 220 = MAX HeartRate    Remember! Do not push yourself too hard.  Start slowly and build up your pace, speed, weight, time in exercise, etc.  Allow your body to rest between exercise and get good sleep. You will need more water than normal when you are exerting yourself. Do not wait until you are thirsty to drink. Drink with a purpose of getting in at least 8, 8 ounce glasses of water a day plus more depending on how much you exercise and sweat.      If you begin to develop dizziness, chest pain, abdominal pain, jaw pain, shortness of breath, headache, vision changes, lightheadedness, or other concerning symptoms, stop the  activity and allow your body to rest. If your symptoms are severe, seek emergency evaluation immediately. If your symptoms are concerning, but not severe, please let us know so that we can recommend further evaluation.

## 2024-04-02 NOTE — Progress Notes (Signed)
 Subjective:   Chelsea Duncan 16-Mar-1986  04/02/2024   CC: Chief Complaint  Patient presents with   Annual Exam    HPI: Chelsea Duncan is a 38 y.o. female who presents for a routine health maintenance exam.  Fasting labs collected at original visit. Discussed today.   HEALTH SCREENINGS: - Vision Screening: up to date - Dental Visits: up to date - Pap smear: completed today  - Breast Exam: done today - STD Screening: Declined - Mammogram (40+): Not applicable  - Colonoscopy (45+): Not applicable  - Bone Density (65+ or under 65 with predisposing conditions): Not applicable  - Lung CA screening with low-dose CT:  Not applicable Adults age 81-80 who are current cigarette smokers or quit within the last 15 years. Must have 20 pack year history.   Depression and Anxiety Screen done today and results listed below:     04/02/2024    8:40 AM 01/30/2024    8:26 AM 10/24/2023    8:21 AM 08/26/2023    2:06 PM 09/10/2022    8:47 AM  Depression screen PHQ 2/9  Decreased Interest 0 0 0    Down, Depressed, Hopeless 0 0 0    PHQ - 2 Score 0 0 0    Altered sleeping 0 0 0    Tired, decreased energy 1 1 0    Change in appetite 0 0 0    Feeling bad or failure about yourself  0 0 0    Trouble concentrating 0 0 1    Moving slowly or fidgety/restless 0 0 1    Suicidal thoughts 0 0 0    PHQ-9 Score 1 1 2     Difficult doing work/chores Not difficult at all Not difficult at all Not difficult at all       Information is confidential and restricted. Go to Review Flowsheets to unlock data.      04/02/2024    8:41 AM 01/30/2024    8:27 AM 10/24/2023    8:22 AM 08/26/2023    2:06 PM  GAD 7 : Generalized Anxiety Score  Nervous, Anxious, on Edge 1 1 1    Control/stop worrying 1 1 1    Worry too much - different things 1 1 1    Trouble relaxing 3 1 1    Restless 1 0 0   Easily annoyed or irritable 3 1 2    Afraid - awful might happen 1 0 1   Total GAD 7 Score 11 5 7    Anxiety Difficulty Not  difficult at all Not difficult at all Not difficult at all      Information is confidential and restricted. Go to Review Flowsheets to unlock data.    IMMUNIZATIONS: - Tdap: Tetanus vaccination status reviewed: last tetanus booster within 10 years. - HPV: Up to date - Influenza: N/A - Pneumovax: Not applicable - Prevnar 20: Not applicable - Zostavax (50+): Not applicable  Past medical history, surgical history, medications, allergies, family history and social history reviewed with patient today and changes made to appropriate areas of the chart.   Past Medical History:  Diagnosis Date   Allergy    Anxiety    Bone tumor (benign)    tumor vs cyst removed btwn age [age] y.o.    BRCA negative 03/2016   MyRisk neg except NBN VUS   Chicken pox    Complication of anesthesia    COVID-19    12/2019   Depression during pregnancy 08/29/2020   Family history of breast cancer 03/2016  IBIS=18.4%  She has been tested for at least BRCA and was negative (~2017). Tyrer-Cuzick (lifetime) was ~18%.    PONV (postoperative nausea and vomiting)    Pyelonephritis    S/P skin biopsy    neck normal    UTI (urinary tract infection)     Past Surgical History:  Procedure Laterality Date   BONE CYST EXCISION     right foot.   LAPAROSCOPY N/A 11/12/2022   Procedure: LAPAROSCOPY DIAGNOSTIC;  Surgeon: Leonce Garnette BIRCH, MD;  Location: [facility];  Service: Gynecology;  Laterality: N/A;   TOTAL LAPAROSCOPIC HYSTERECTOMY WITH BILATERAL SALPINGO OOPHORECTOMY N/A 11/12/2022   Procedure: LAPAROSCOPIC BILATERAL SALPINGECTOMY, REAPAIR OF LEFT TUBE AVULSION, EVACUATION OF HEMOPERITONEUM;  Surgeon: Leonce Garnette BIRCH, MD;  Location: [facility];  Service: Gynecology;  Laterality: N/A;   TUBAL LIGATION N/A 11/11/2022   Procedure: POST PARTUM TUBAL LIGATION;  Surgeon: Leonce Garnette BIRCH, MD;  Location: [facility];  Service: Gynecology;  Laterality: N/A;    Current Outpatient Medications on File Prior to Visit  Medication  Sig   citalopram  (CELEXA ) 20 MG tablet Take 1 tablet (20 mg total) by mouth daily.   No current facility-administered medications on file prior to visit.    Allergies  Allergen Reactions   Cefadroxil Nausea And Vomiting   Fire Ant Other (See Comments)    anaphylaxis anaphylaxis   Septra [Sulfamethoxazole-Trimethoprim] Nausea And Vomiting     Social History   Socioeconomic History   Marital status: Married    Spouse name: [email or web address]   Number of children: 3   Years of education: Not on file   Highest education level: Master's degree (e.g., MA, MS, MEng, MEd, MSW, MBA)  Occupational History   Not on file  Tobacco Use   Smoking status: Never    Passive exposure: Never   Smokeless tobacco: Never  Vaping Use   Vaping status: Never Used  Substance and Sexual Activity   Alcohol use: Yes    Comment: social   Drug use: No   Sexual activity: Yes    Partners: Male    Birth control/protection: Surgical  Other Topics Concern   Not on file  Social History Narrative   Married 3 boys    Grew up in [place]    1 older sister 2 years older than her    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   Social Drivers of Corporate investment banker Strain: Not on file  Food Insecurity: No Food Insecurity (11/10/2022)   Hunger Vital Sign    Worried About Running Out of Food in the Last Year: Never true    Ran Out of Food in the Last Year: Never true  Transportation Needs: No Transportation Needs (11/10/2022)   PRAPARE - Administrator, Civil Service (Medical): No    Lack of Transportation (Non-Medical): No  Physical Activity: Not on file  Stress: Not on file  Social Connections: Not on file  Intimate Partner Violence: Unknown (11/10/2022)   Humiliation, Afraid, Rape, and Kick questionnaire    Fear of Current or Ex-Partner: No    Emotionally Abused: Not on file    Physically Abused: Not on file    Sexually Abused: Not on file    Social History   Tobacco Use  Smoking Status Never   Passive exposure: Never  Smokeless Tobacco Never   Social History   Substance and Sexual Activity  Alcohol Use Yes   Comment:  social    Family History  Problem Relation Age of Onset   Breast cancer Mother 68       breast / died at 68 in April 15, 2010   Hypertension Mother    Depression Mother    Arthritis Father    Depression Father        committed suicide in 2015/04/16   Hypertension Father    Hypertension Sister    Heart disease Sister        heart murmur   Birth defects Sister    Depression Sister    Arthritis Maternal Grandmother    Other Maternal Grandmother        elevated tau protein; had corticobasal degneration    Cancer Paternal Grandmother        lung   Emphysema Paternal Grandfather    ROS: Denies fever, fatigue, unexplained weight loss/gain, chest pain, SHOB, and palpitations. Denies neurological deficits, gastrointestinal or genitourinary complaints, and skin changes.   Objective:   Today's Vitals   04/02/24 0828  BP: 111/74  Pulse: 72  Temp: 98.3 F (36.8 C)  TempSrc: Oral  SpO2: 98%  Weight: 125 lb (56.7 kg)  Height: 5' 4 (1.626 m)    GENERAL APPEARANCE: Well-appearing, in NAD. Well nourished.  SKIN: Pink, warm and dry. Turgor normal. No rash, lesion, ulceration, or ecchymoses. Hair evenly distributed.  HEENT: HEAD: Normocephalic.  EYES: PERRLA. EOMI. Lids intact w/o defect. Sclera white, Conjunctiva pink w/o exudate.  EARS: External ear w/o redness, swelling, masses or lesions. EAC clear. TM's intact, translucent w/o bulging, appropriate landmarks visualized. Appropriate acuity to conversational tones.  NOSE: Septum midline w/o deformity. Nares patent, mucosa pink and non-inflamed w/o drainage. No sinus tenderness.  THROAT: Uvula midline. Oropharynx clear. Tonsils non-inflamed w/o exudate. Oral mucosa pink and moist.  NECK: Supple, Trachea midline. Full ROM w/o pain or tenderness. No  lymphadenopathy. Thyroid  non-tender w/o enlargement or palpable masses.  BREASTS: Breasts pendulous, symmetrical, and w/o palpable masses. Nipples everted and w/o discharge. No rash or skin retraction. No axillary or supraclavicular lymphadenopathy.  RESPIRATORY: Chest wall symmetrical w/o masses. Respirations even and non-labored. Breath sounds clear to auscultation bilaterally. No wheezes, rales, rhonchi, or crackles. CARDIAC: S1, S2 present, regular rate and rhythm. No gallops, murmurs, rubs, or clicks. PMI w/o lifts, heaves, or thrills. No carotid bruits. Capillary refill <2 seconds. Peripheral pulses 2+ bilaterally. GI: Abdomen soft w/o distention. Normoactive bowel sounds. No palpable masses or tenderness. No guarding or rebound tenderness. Liver and spleen w/o tenderness or enlargement. No CVA tenderness.  GU: External genitalia without erythema, lesions, or masses. No lymphadenopathy. Vaginal mucosa pink and moist without exudate, lesions, or ulcerations. Cervix pink without discharge. Cervical os closed. Uterus and adnexae palpable, not enlarged, and w/o tenderness. No palpable masses.   MSK: Muscle tone and strength appropriate for age, w/o atrophy or abnormal movement.  EXTREMITIES: Active ROM intact, w/o tenderness, crepitus, or contracture. No obvious joint deformities or effusions. No clubbing, edema, or cyanosis.  NEUROLOGIC: CN's II-XII intact. Motor strength symmetrical with no obvious weakness. No sensory deficits. DTR's 2+ symmetric bilaterally. Steady, even gait.  PSYCH/MENTAL STATUS: Alert, oriented x 3. Cooperative, appropriate mood and affect.   Chaperoned by Claudette Goodwill, CMA   Results for orders placed or performed in visit on 01/30/24  Urine Culture   Collection Time: 01/30/24  8:58 AM   Specimen: Urine   UR  Result Value Ref Range   Urine Culture, Routine Final report    Organism ID, Bacteria Lactobacillus  species    ORGANISM ID, BACTERIA Comment   POCT URINALYSIS  DIP (CLINITEK)   Collection Time: 01/30/24  9:26 AM  Result Value Ref Range   Color, UA yellow yellow   Clarity, UA clear clear   Glucose, UA negative negative mg/dL   Bilirubin, UA negative negative   Ketones, POC UA negative negative mg/dL   Spec Grav, UA >=8.969 (A) 1.010 - 1.025   Blood, UA trace-intact (A) negative   pH, UA 6.0 5.0 - 8.0   POC PROTEIN,UA trace negative, trace   Urobilinogen, UA 0.2 0.2 or 1.0 E.U./dL   Nitrite, UA Negative Negative   Leukocytes, UA Large (3+) (A) Negative    Assessment & Plan:   1. Wellness examination (Primary) - Encouraged a healthy well-balanced diet. Patient may adjust caloric intake to maintain or achieve ideal body weight. May reduce intake of dietary saturated fat and total fat and have adequate dietary potassium and calcium preferably from fresh fruits, vegetables, and low-fat dairy products.   - Advised to avoid cigarette smoking. - Discussed with the patient that most people either abstain from alcohol or drink within safe limits (<=14/week and <=4 drinks/occasion for males, <=7/weeks and <= 3 drinks/occasion for females) and that the risk for alcohol disorders and other health effects rises proportionally with the number of drinks per week and how often a drinker exceeds daily limits. - Discussed cessation/primary prevention of drug use and availability of treatment for abuse.  - Discussed sexually transmitted diseases, avoidance of unintended pregnancy and contraceptive alternatives. - Stressed the importance of regular exercise - Injury prevention: Discussed safety belts, safety helmets, smoke detector, smoking near bedding or upholstery.  - Dental health: Discussed importance of regular tooth brushing, flossing, and dental visits.  NEXT PREVENTATIVE PHYSICAL DUE IN 1 YEAR.  2. Screening for cervical cancer Patient is agreeable to procedure today. Clinical breast exam completed with no abnormalities noted. No skin changes, nipple  discharge, masses/lumps, or tenderness present. No enlarged axillary lymph nodes palpated. Advised patient to perform breast self-examination monthly during menstrual cycle. Visual inspection of external genitalia of vagina. No inguinal lymphadenopathy palpated. Vaginal walls and cervix appear pink during speculum examination. Cervical os visualized. Pap completed with no complications. Will update patient with results.  - Cytology - PAP  3. Vitamin B12 deficiency Will repeat vitamin B12 labs today and update patient with results.  - Vitamin B12 - cyanocobalamin  (VITAMIN B12) injection 1,000 mcg  4. Vitamin D  deficiency Will repeat vitamin D  lab today and update patient with results.  - VITAMIN D  25 Hydroxy (Vit-D Deficiency, Fractures)   Return in about 1 year (around 04/02/2025) for Physical with fasting labs.  Patient to reach out to office if new, worrisome, or unresolved symptoms arise or if no improvement in patient's condition. Patient verbalized understanding and is agreeable to treatment plan. All questions answered to patient's satisfaction.   Evalene Arts, FNP

## 2024-04-03 LAB — VITAMIN B12: Vitamin B-12: >2000 pg/mL — ABNORMAL HIGH (ref 232–1245)

## 2024-04-03 LAB — VITAMIN D 25 HYDROXY (VIT D DEFICIENCY, FRACTURES): Vit D, 25-Hydroxy: 49 ng/mL (ref 30.0–100.0)

## 2024-04-05 ENCOUNTER — Ambulatory Visit: Payer: Self-pay | Admitting: Family Medicine

## 2024-04-06 LAB — CYTOLOGY - PAP
Comment: NEGATIVE
Diagnosis: NEGATIVE
High risk HPV: NEGATIVE

## 2024-06-04 ENCOUNTER — Other Ambulatory Visit: Payer: Self-pay | Admitting: Psychiatry

## 2024-06-04 DIAGNOSIS — F411 Generalized anxiety disorder: Secondary | ICD-10-CM

## 2024-06-04 DIAGNOSIS — F4010 Social phobia, unspecified: Secondary | ICD-10-CM

## 2024-07-29 ENCOUNTER — Telehealth: Admitting: Psychiatry

## 2024-07-29 ENCOUNTER — Encounter: Payer: Self-pay | Admitting: Psychiatry

## 2024-07-29 DIAGNOSIS — G4709 Other insomnia: Secondary | ICD-10-CM | POA: Diagnosis not present

## 2024-07-29 DIAGNOSIS — F411 Generalized anxiety disorder: Secondary | ICD-10-CM

## 2024-07-29 DIAGNOSIS — F4010 Social phobia, unspecified: Secondary | ICD-10-CM | POA: Diagnosis not present

## 2024-07-29 NOTE — Progress Notes (Signed)
 Virtual Visit via Video Note  I connected with Chelsea Duncan on 07/29/24 at  4:00 PM EDT by a video enabled telemedicine application and verified that I am speaking with the correct person using two identifiers.  Location Provider Location : [facility] Patient Location : Work  Participants: Patient , Provider   I discussed the limitations of evaluation and management by telemedicine and the availability of in person appointments. The patient expressed understanding and agreed to proceed.   I discussed the assessment and treatment plan with the patient. The patient was provided an opportunity to ask questions and all were answered. The patient agreed with the plan and demonstrated an understanding of the instructions.   The patient was advised to call back or seek an in-person evaluation if the symptoms worsen or if the condition fails to improve as anticipated.   BH MD OP Progress Note  07/29/2024 4:19 PM Chelsea Duncan  MRN:  [id number]  Chief Complaint:  Chief Complaint  Patient presents with   Follow-up   Anxiety   Medication Refill   Discussed the use of AI scribe software for clinical note transcription with the patient, who gave verbal consent to proceed.  History of Present Illness Chelsea Duncan is a 38 year old Caucasian female, married, currently employed, lives in [place] with her spouse, has a history of GAD, social anxiety disorder, bereavement, insomnia was evaluated by telemedicine today.  She currently reports overall anxiety symptoms as manageable on the current medication regimen.  She does have situational anxiety which causes heightened irritability when managing multiple responsibilities. She reports that a busy season with her children's activities, school functions, and various appointments has contributed to increased stress and irritability, and she feels pulled in many directions.  She however has been coping well.She continues to take Celexa  20 mg every morning and  reports no side effects or concerns with this medication.    Self-care strategies such as listening to podcasts and journaling help her manage her mental health. She reflects on her tendency toward perfectionism and high self-expectations, recognizing this as a potential area for further personal growth. She has not seen a therapist recently but is considering reaching out for support regarding her perfectionism and self-criticism.  She reports sleep is good when her children does not need her help at night.  Appetite is fair.  She denies any thoughts of suicidality.  Denies any homicidality or perceptual disturbances.   Visit Diagnosis:    ICD-10-CM   1. GAD (generalized anxiety disorder)  F41.1     2. Social anxiety disorder  F40.10     3. Other insomnia  G47.09    Due to lack of sleep hygiene and having small children at home      Past Psychiatric History: I have reviewed past psychiatric history from progress note on 10/11/2021.  Past trials of medications like Zoloft , Celexa , Xanax .  I have reviewed neuropsychological testing by Dr. Isaiah Queen, does not meet criteria for ADHD-dated 03/29/2019 - 04/20/2019.  Past Medical History:  Past Medical History:  Diagnosis Date   Allergy    Anxiety    Bone tumor (benign)    tumor vs cyst removed btwn age [age] y.o.    BRCA negative 03/2016   MyRisk neg except NBN VUS   Chicken pox    Complication of anesthesia    COVID-19    12/2019   Depression during pregnancy 08/29/2020   Family history of breast cancer 03/2016   IBIS=18.4%  She has been tested for  at least BRCA and was negative (~2017). Tyrer-Cuzick (lifetime) was ~18%.    PONV (postoperative nausea and vomiting)    Pyelonephritis    S/P skin biopsy    neck normal    UTI (urinary tract infection)     Past Surgical History:  Procedure Laterality Date   BONE CYST EXCISION     right foot.   LAPAROSCOPY N/A 11/12/2022   Procedure: LAPAROSCOPY DIAGNOSTIC;  Surgeon: Leonce Garnette BIRCH, MD;  Location: [facility];  Service: Gynecology;  Laterality: N/A;   TOTAL LAPAROSCOPIC HYSTERECTOMY WITH BILATERAL SALPINGO OOPHORECTOMY N/A 11/12/2022   Procedure: LAPAROSCOPIC BILATERAL SALPINGECTOMY, REAPAIR OF LEFT TUBE AVULSION, EVACUATION OF HEMOPERITONEUM;  Surgeon: Leonce Garnette BIRCH, MD;  Location: [facility];  Service: Gynecology;  Laterality: N/A;   TUBAL LIGATION N/A 11/11/2022   Procedure: POST PARTUM TUBAL LIGATION;  Surgeon: Leonce Garnette BIRCH, MD;  Location: [facility];  Service: Gynecology;  Laterality: N/A;    Family Psychiatric History: I have reviewed family psychiatric history from progress note on 10/11/2021.  Family History:  Family History  Problem Relation Age of Onset   Breast cancer Mother 70       breast / died at 76 in 2010-10-23   Hypertension Mother    Depression Mother    Arthritis Father    Depression Father        committed suicide in 10/24/2015   Hypertension Father    Hypertension Sister    Heart disease Sister        heart murmur   Birth defects Sister    Depression Sister    Arthritis Maternal Grandmother    Other Maternal Grandmother        elevated tau protein; had corticobasal degneration    Cancer Paternal Grandmother        lung   Emphysema Paternal Grandfather     Social History: I have reviewed social history from progress note on 10/11/2021. Social History   Socioeconomic History   Marital status: Married    Spouse name: [email or web address]   Number of children: 3   Years of education: Not on file   Highest education level: Master's degree (e.g., MA, MS, MEng, MEd, MSW, MBA)  Occupational History   Not on file  Tobacco Use   Smoking status: Never    Passive exposure: Never   Smokeless tobacco: Never  Vaping Use   Vaping status: Never Used  Substance and Sexual Activity   Alcohol use: Yes    Comment: social   Drug use: No   Sexual activity: Yes    Partners: Male    Birth control/protection: Surgical  Other Topics Concern   Not on file  Social  History Narrative   Married 3 boys    Grew up in [place]    1 older sister 2 years older than her    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   Social Drivers of Corporate Investment Banker Strain: Not on file  Food Insecurity: No Food Insecurity (11/10/2022)   Hunger Vital Sign    Worried About Running Out of Food in the Last Year: Never true    Ran Out of Food in the Last Year: Never true  Transportation Needs: No Transportation Needs (11/10/2022)   PRAPARE - Administrator, Civil Service (Medical): No    Lack of Transportation (Non-Medical): No  Physical Activity: Not on file  Stress: Not on file  Social Connections:  Not on file    Allergies:  Allergies  Allergen Reactions   Cefadroxil Nausea And Vomiting   Fire Ant Other (See Comments)    anaphylaxis anaphylaxis   Septra [Sulfamethoxazole-Trimethoprim] Nausea And Vomiting    Metabolic Disorder Labs: Lab Results  Component Value Date   HGBA1C 5.0 10/24/2023   No results found for: PROLACTIN Lab Results  Component Value Date   CHOL 120 10/24/2023   TRIG 44 10/24/2023   HDL 42 10/24/2023   CHOLHDL 2.9 10/24/2023   VLDL 80.3 04/20/2021   LDLCALC 67 10/24/2023   LDLCALC 67 04/20/2021   Lab Results  Component Value Date   TSH 1.780 10/24/2023   TSH 1.54 08/29/2021    Therapeutic Level Labs: No results found for: LITHIUM No results found for: VALPROATE No results found for: CBMZ  Current Medications: Current Outpatient Medications  Medication Sig Dispense Refill   citalopram  (CELEXA ) 20 MG tablet TAKE 1 TABLET BY MOUTH EVERY DAY 90 tablet 1   Current Facility-Administered Medications  Medication Dose Route Frequency Provider Last Rate Last Admin   cyanocobalamin  (VITAMIN B12) injection 1,000 mcg  1,000 mcg Intramuscular Once          Musculoskeletal: Strength & Muscle Tone: UTA Gait & Station: Seated Patient leans:  N/A  Psychiatric Specialty Exam: Review of Systems  Psychiatric/Behavioral:  Positive for sleep disturbance.     not currently breastfeeding.There is no height or weight on file to calculate BMI.  General Appearance: Casual  Eye Contact:  Fair  Speech:  Clear and Coherent  Volume:  Normal  Mood:  Euthymic  Affect:  Congruent  Thought Process:  Goal Directed and Descriptions of Associations: Intact  Orientation:  Full (Time, Place, and Person)  Thought Content: Logical   Suicidal Thoughts:  No  Homicidal Thoughts:  No  Memory:  Immediate;   Fair Recent;   Fair Remote;   Fair  Judgement:  Fair  Insight:  Fair  Psychomotor Activity:  Normal  Concentration:  Concentration: Fair and Attention Span: Fair  Recall:  [employer] of Knowledge: Fair  Language: Fair  Akathisia:  No  Handed:  Right  AIMS (if indicated): not done  Assets:  Communication Skills Desire for Improvement Housing Intimacy Resilience Social Support Talents/Skills Transportation Vocational/Educational  ADL's:  Intact  Cognition: WNL  Sleep:  Varies due to having a toddler   Screenings: AIMS    Flowsheet Row Video Visit from 05/17/2022 in [facility] Video Visit from 04/01/2022 in [facility]  AIMS Total Score 0 0   AUDIT    Flowsheet Row Office Visit from 06/11/2017 in [facility]  Alcohol Use Disorder Identification Test Final Score (AUDIT) 0   GAD-7    Flowsheet Row Office Visit from 04/02/2024 in [facility] Visit from 01/30/2024 in [facility] Visit from 10/24/2023 in [facility] Visit from 08/26/2023 in [facility] Video Visit from 07/26/2022 in [facility]  Total GAD-7 Score 11 5 7 6 13    PHQ2-9    Flowsheet Row Video Visit  from 07/29/2024 in [facility] Office Visit from 04/02/2024 in [facility] at [facility] Visit from 01/30/2024 in [facility] at [facility] Visit from 10/24/2023 in [facility] at [facility] Visit from 08/26/2023 in   [facility]  PHQ-2 Total Score 0 0 0 0 0  PHQ-9 Total Score -- 1 1 2  --   Flowsheet Row Video Visit from 07/29/2024 in [facility] Video Visit from 02/24/2024 in [facility] Office Visit from 08/26/2023 in [facility]  C-SSRS RISK CATEGORY No Risk No Risk No Risk     Assessment and Plan: Chelsea Duncan is a 38 year old Caucasian female who has a history of anxiety, insomnia was evaluated by telemedicine today.  Discussed assessment and plan as noted below.  1. GAD (generalized anxiety disorder)-stable Currently although does have situational anxiety has been managing well on the current medication regimen Continue Celexa  20 mg daily Consider psychotherapy sessions as needed  2. Social anxiety disorder-stable Denies any concerns Continue Celexa  20 mg daily  3. Other insomnia-unstable Sleep problems due to children needing help at home. Encouraged to work on sleep hygiene.  Follow-up Follow-up in clinic in 5 months or sooner in person    Collaboration of Care: Collaboration of Care: Referral or follow-up with counselor/therapist AEB encouraged to establish care with therapist as needed  Patient/Guardian was advised Release of Information must be obtained prior to any record release in order to collaborate their care with an outside provider. Patient/Guardian was advised if they have not already done so to contact the registration department to sign all necessary forms in order for us  to release information regarding their  care.   Consent: Patient/Guardian gives verbal consent for treatment and assignment of benefits for services provided during this visit. Patient/Guardian expressed understanding and agreed to proceed.  This note was generated in part or whole with voice recognition software. Voice recognition is usually quite accurate but there are transcription errors that can and very often do occur. I apologize for any typographical errors that were not detected and corrected.     Ayeza Therriault, MD 07/30/2024, 6:26 AM

## 2024-09-06 DIAGNOSIS — R41840 Attention and concentration deficit: Secondary | ICD-10-CM

## 2024-09-23 ENCOUNTER — Telehealth: Payer: Self-pay | Admitting: Family Medicine

## 2024-09-23 NOTE — Telephone Encounter (Signed)
 Copied from CRM #[id number]. Topic: Appointments - Scheduling Inquiry for Clinic >> Sep 22, 2024  3:08 PM Anairis L wrote: Reason for CRM: Patient would like to get tested for the flu, kids have been sick and she has in laws that are immune compromised.     Please call mother to schedule appointment or UC if needed.

## 2024-10-04 NOTE — Telephone Encounter (Signed)
 Spoke with patient as a follow-up; patient stated all has passed and no need to schedule and appt.

## 2024-12-13 ENCOUNTER — Ambulatory Visit: Admitting: Psychiatry

## 2025-03-28 ENCOUNTER — Ambulatory Visit

## 2025-04-04 ENCOUNTER — Encounter: Admitting: Family Medicine
